# Patient Record
Sex: Female | Born: 1955 | Race: White | Hispanic: No | Marital: Married | Smoking: Former smoker
Health system: Southern US, Community
[De-identification: ages and names within clinical notes are randomized; demographics above are authoritative.]

## PROBLEM LIST (undated history)

## (undated) DIAGNOSIS — K589 Irritable bowel syndrome without diarrhea: Secondary | ICD-10-CM

## (undated) DIAGNOSIS — Z86010 Personal history of colonic polyps: Secondary | ICD-10-CM

## (undated) DIAGNOSIS — I8390 Asymptomatic varicose veins of unspecified lower extremity: Secondary | ICD-10-CM

## (undated) DIAGNOSIS — I10 Essential (primary) hypertension: Secondary | ICD-10-CM

## (undated) DIAGNOSIS — R112 Nausea with vomiting, unspecified: Secondary | ICD-10-CM

## (undated) DIAGNOSIS — Z9889 Other specified postprocedural states: Secondary | ICD-10-CM

## (undated) DIAGNOSIS — K219 Gastro-esophageal reflux disease without esophagitis: Secondary | ICD-10-CM

## (undated) HISTORY — DX: Irritable bowel syndrome, unspecified: K58.9

## (undated) HISTORY — PX: BREAST EXCISIONAL BIOPSY: SUR124

## (undated) HISTORY — DX: Personal history of colonic polyps: Z86.010

## (undated) HISTORY — DX: Asymptomatic varicose veins of unspecified lower extremity: I83.90

## (undated) HISTORY — PX: WISDOM TOOTH EXTRACTION: SHX21

## (undated) HISTORY — PX: COLONOSCOPY: SHX174

## (undated) HISTORY — PX: BREAST BIOPSY: SHX20

## (undated) HISTORY — DX: Gastro-esophageal reflux disease without esophagitis: K21.9

## (undated) HISTORY — PX: UPPER GASTROINTESTINAL ENDOSCOPY: SHX188

## (undated) HISTORY — DX: Essential (primary) hypertension: I10

---

## 1988-05-31 HISTORY — PX: NASAL SEPTUM SURGERY: SHX37

## 1998-08-14 ENCOUNTER — Ambulatory Visit (HOSPITAL_BASED_OUTPATIENT_CLINIC_OR_DEPARTMENT_OTHER): Admission: RE | Admit: 1998-08-14 | Discharge: 1998-08-14 | Payer: Self-pay | Admitting: Otolaryngology

## 2000-01-29 ENCOUNTER — Encounter: Payer: Self-pay | Admitting: Family Medicine

## 2000-01-29 ENCOUNTER — Encounter: Admission: RE | Admit: 2000-01-29 | Discharge: 2000-01-29 | Payer: Self-pay | Admitting: *Deleted

## 2005-01-29 ENCOUNTER — Encounter: Admission: RE | Admit: 2005-01-29 | Discharge: 2005-01-29 | Payer: Self-pay | Admitting: Family Medicine

## 2005-02-09 ENCOUNTER — Encounter: Admission: RE | Admit: 2005-02-09 | Discharge: 2005-02-09 | Payer: Self-pay | Admitting: Family Medicine

## 2005-02-25 ENCOUNTER — Encounter (INDEPENDENT_AMBULATORY_CARE_PROVIDER_SITE_OTHER): Payer: Self-pay | Admitting: *Deleted

## 2005-02-25 ENCOUNTER — Encounter: Admission: RE | Admit: 2005-02-25 | Discharge: 2005-02-25 | Payer: Self-pay | Admitting: Family Medicine

## 2005-08-24 ENCOUNTER — Encounter: Admission: RE | Admit: 2005-08-24 | Discharge: 2005-08-24 | Payer: Self-pay | Admitting: *Deleted

## 2006-02-02 ENCOUNTER — Encounter: Admission: RE | Admit: 2006-02-02 | Discharge: 2006-02-02 | Payer: Self-pay | Admitting: *Deleted

## 2006-08-05 ENCOUNTER — Encounter: Admission: RE | Admit: 2006-08-05 | Discharge: 2006-08-05 | Payer: Self-pay | Admitting: *Deleted

## 2007-08-07 ENCOUNTER — Encounter: Admission: RE | Admit: 2007-08-07 | Discharge: 2007-08-07 | Payer: Self-pay | Admitting: *Deleted

## 2007-12-25 ENCOUNTER — Ambulatory Visit: Payer: Self-pay | Admitting: Internal Medicine

## 2008-01-02 ENCOUNTER — Telehealth: Payer: Self-pay | Admitting: Internal Medicine

## 2008-01-03 ENCOUNTER — Ambulatory Visit: Payer: Self-pay | Admitting: Internal Medicine

## 2008-08-08 ENCOUNTER — Encounter: Admission: RE | Admit: 2008-08-08 | Discharge: 2008-08-08 | Payer: Self-pay | Admitting: *Deleted

## 2009-08-15 ENCOUNTER — Encounter: Admission: RE | Admit: 2009-08-15 | Discharge: 2009-08-15 | Payer: Self-pay | Admitting: *Deleted

## 2010-06-21 ENCOUNTER — Encounter: Payer: Self-pay | Admitting: Family Medicine

## 2010-06-30 NOTE — Progress Notes (Signed)
Summary: PREP MEDS  Phone Note From Pharmacy Call back at [phone]   Caller: [facility]/JODY Call For: Kelsey Simon  Details for Reason: meds Summary of Call: did NOT receive METOCLOPRAMIDINE pt having proc tomorrow. Please re-send ASAP Initial call taken by: [facility],  January 02, 2008 9:15 AM  Follow-up for Phone Call         Have attempted to call [facility] since 10am today and the line is busy. Will continue to try.Barton Fanny RN  January 02, 2008 11:25 AM Spoke with pharmacist and gave phone order for Reglan. Follow-up by: Barton Fanny RN,  January 02, 2008 1:16 PM

## 2010-06-30 NOTE — Procedures (Signed)
Summary: Colonoscopy   Colonoscopy  Procedure date:  01/03/2008  Findings:      Location:  [facility].    Procedures Next Due Date:    Colonoscopy: 12/2017  Patient Name: Kelsey Simon, Kelsey Simon MRN: [id number] Procedure Procedures: Colonoscopy CPT: 09811.  Personnel: Endoscopist: Iva Boop, MD, [facility].  Exam Location: Exam performed in Outpatient Clinic. Outpatient  Patient Consent: Procedure, Alternatives, Risks and Benefits discussed, consent obtained, from patient. Consent was obtained by the RN.  Indications  Average Risk Screening Routine.  History  Current Medications: Patient is not currently taking Coumadin.  Allergies: Allergic to PENICILLIN.  Pre-Exam Physical: Performed Jan 03, 2008. Cardio-pulmonary exam, Rectal exam, HEENT exam , Abdominal exam, Mental status exam WNL.  Comments: Pt. history reviewed/updated, physical exam performed prior to initiation of sedation? YES Exam Exam: Extent of exam reached: Cecum, extent intended: Cecum.  The cecum was identified by appendiceal orifice and IC valve. Patient position: left side to back. Time to Cecum: 00:10:55. Time for Withdrawl: 00: 12:20. Colon retroflexion performed. Images taken. ASA Classification: II. Tolerance: good.  Monitoring: Pulse and BP monitoring, Oximetry used. Supplemental O2 given.  Colon Prep Used MiraLax for colon prep. Prep results: excellent.  Sedation Meds: Patient assessed and found to be appropriate for moderate (conscious) sedation. Fentanyl 100 mcg. given IV. Versed 8 mg. given IV.  Findings - NORMAL EXAM: Cecum to Descending Colon.  - DIVERTICULOSIS: Sigmoid Colon. Comments: mild.  HEMORRHOIDS: Internal. Size: Grade I.   Assessment  Comments: 1) MILD SIGMOID DIVERTICULOSIS 2) SMALL INTERNAL HEMORRHOIDS 3) SOME IBS RESPONSE TO SCOPE INSERTION SUSPECTED 4) OTHERWISE NORMAL SCREENING COLONOSCPY WITH EXCELLENT PREP Events  Unplanned Interventions: No  intervention was required.  Plans Patient Education: Patient given standard instructions for: Diverticulosis. Hemorrhoids.  Disposition: After procedure patient sent to recovery. After recovery patient sent home.  Scheduling/Referral: Colonoscopy, ROUTINE IN 10 YRS,      cc.   Lucy Chris   This report was created from the original endoscopy report, which was reviewed and signed by the above listed endoscopist.

## 2010-07-10 ENCOUNTER — Other Ambulatory Visit: Payer: Self-pay | Admitting: *Deleted

## 2010-07-10 DIAGNOSIS — Z1231 Encounter for screening mammogram for malignant neoplasm of breast: Secondary | ICD-10-CM

## 2010-08-21 ENCOUNTER — Ambulatory Visit
Admission: RE | Admit: 2010-08-21 | Discharge: 2010-08-21 | Disposition: A | Discharge status: Home / Self Care | Payer: BC Managed Care – PPO | Source: Ambulatory Visit | Attending: *Deleted | Admitting: *Deleted

## 2010-08-21 DIAGNOSIS — Z1231 Encounter for screening mammogram for malignant neoplasm of breast: Secondary | ICD-10-CM

## 2011-07-13 ENCOUNTER — Other Ambulatory Visit: Payer: Self-pay | Admitting: *Deleted

## 2011-07-13 DIAGNOSIS — Z1231 Encounter for screening mammogram for malignant neoplasm of breast: Secondary | ICD-10-CM

## 2011-08-24 ENCOUNTER — Ambulatory Visit
Admission: RE | Admit: 2011-08-24 | Discharge: 2011-08-24 | Disposition: A | Discharge status: Home / Self Care | Payer: BC Managed Care – PPO | Source: Ambulatory Visit | Attending: *Deleted | Admitting: *Deleted

## 2011-08-24 DIAGNOSIS — Z1231 Encounter for screening mammogram for malignant neoplasm of breast: Secondary | ICD-10-CM

## 2012-01-04 ENCOUNTER — Ambulatory Visit (INDEPENDENT_AMBULATORY_CARE_PROVIDER_SITE_OTHER): Payer: BC Managed Care – PPO | Admitting: Family Medicine

## 2012-01-04 ENCOUNTER — Encounter: Payer: Self-pay | Admitting: Family Medicine

## 2012-01-04 VITALS — BP 160/100 | HR 80 | Temp 98.3°F | Ht 66.75 in | Wt 146.0 lb

## 2012-01-04 DIAGNOSIS — F419 Anxiety disorder, unspecified: Secondary | ICD-10-CM | POA: Insufficient documentation

## 2012-01-04 DIAGNOSIS — I10 Essential (primary) hypertension: Secondary | ICD-10-CM

## 2012-01-04 DIAGNOSIS — Z136 Encounter for screening for cardiovascular disorders: Secondary | ICD-10-CM

## 2012-01-04 DIAGNOSIS — F4321 Adjustment disorder with depressed mood: Secondary | ICD-10-CM

## 2012-01-04 DIAGNOSIS — Z0000 Encounter for general adult medical examination without abnormal findings: Secondary | ICD-10-CM

## 2012-01-04 LAB — COMPREHENSIVE METABOLIC PANEL
ALT: 28 U/L (ref 0–35)
AST: 26 U/L (ref 0–37)
Albumin: 4.3 g/dL (ref 3.5–5.2)
Alkaline Phosphatase: 38 U/L — ABNORMAL LOW (ref 39–117)
BUN: 14 mg/dL (ref 6–23)
CO2: 28 mEq/L (ref 19–32)
Calcium: 9.4 mg/dL (ref 8.4–10.5)
Chloride: 100 mEq/L (ref 96–112)
Creatinine, Ser: 0.7 mg/dL (ref 0.4–1.2)
GFR: 85.73 mL/min (ref >60.00)
Glucose, Bld: 102 mg/dL — ABNORMAL HIGH (ref 70–99)
Potassium: 3.9 mEq/L (ref 3.5–5.1)
Sodium: 138 mEq/L (ref 135–145)
Total Bilirubin: 0.6 mg/dL (ref 0.3–1.2)
Total Protein: 7.2 g/dL (ref 6.0–8.3)

## 2012-01-04 LAB — LDL CHOLESTEROL, DIRECT: Direct LDL: 123.1 mg/dL

## 2012-01-04 LAB — LIPID PANEL
Cholesterol: 228 mg/dL — ABNORMAL HIGH (ref 0–200)
HDL: 97.8 mg/dL (ref >39.00)
Total CHOL/HDL Ratio: 2
Triglycerides: 56 mg/dL (ref 0.0–149.0)
VLDL: 11.2 mg/dL (ref 0.0–40.0)

## 2012-01-04 NOTE — Patient Instructions (Addendum)
It was so nice to meet you. Please call Dr. Lorenda Cahill- I will let you know you may be calling. [address]  [place], [place] [address] [phone] Office [phone] Fax  Make an appointment to see Dr. Patsy Lager on your way out. We will call you with your lab results.

## 2012-01-04 NOTE — Progress Notes (Signed)
Subjective:    Patient ID: Kelsey Simon, female    DOB: 1955/09/20, 56 y.o.   MRN: [id number]  HPI  56 yo here to establish care.   HTN- very elevated today but had an emergency at work.  BP at home typically runs in 130s-140s/80s.  Denies any HA, blurred vision, CP or SOB. Has been on current dose of Maxzide for years.  Grief- father just died in 01-17-2023.  She feels she is coping ok with that but now concerned about caring for her mother.  Her daughter is also a recovering drug addict.  Feels anxious at times but does not feel that she has an anxiety disorder or depression. No SI or HI. She is sleeping ok.  Appetite ok.  Does not have time to exercise like she likes to. Now works in [place] as well.  Patient Active Problem List  Diagnosis  . Hypertension  . Grief   Past Medical History  Diagnosis Date  . Hypertension   . Varicose veins    Past Surgical History  Procedure Date  . Nasal septum surgery 1990   History  Substance Use Topics  . Smoking status: Former Games developer  . Smokeless tobacco: Not on file  . Alcohol Use: Not on file   Family History  Problem Relation Age of Onset  . Hypertension Mother   . Cancer Father   . Drug abuse Daughter    Allergies  Allergen Reactions  . Penicillins     REACTION: rash   Current Outpatient Prescriptions on File Prior to Visit  Medication Sig Dispense Refill  . Calcium Carbonate-Vit D-Min (CALCIUM 600 + MINERALS PO) Take by mouth.      . clonazePAM (KLONOPIN) 0.5 MG tablet Take one half by mouth as needed for sleep      . triamterene-hydrochlorothiazide (MAXZIDE) 75-50 MG per tablet Take 1 tablet by mouth daily.       The PMH, PSH, Social History, Family History, Medications, and allergies have been reviewed in [facility], and have been updated if relevant.   Review of Systems See HPI Patient reports no  vision/ hearing changes,anorexia, fever ,adenopathy, persistant / recurrent hoarseness, swallowing issues, chest pain,  edema,persistant / recurrent cough, hemoptysis, dyspnea(rest, exertional, paroxysmal nocturnal), gastrointestinal  bleeding (melena, rectal bleeding), abdominal pain, excessive heart burn, GU symptoms(dysuria, hematuria, pyuria, voiding/incontinence  Issues) syncope, focal weakness, severe memory loss, concerning skin lesions,  abnormal bruising/bleeding, major joint swelling, breast masses or abnormal vaginal bleeding.       Objective:   Physical Exam BP 160/100  Pulse 80  Temp 98.3 F (36.8 C)  Ht 5' 6.75" (1.695 m)  Wt 146 lb (66.225 kg)  BMI 23.04 kg/m2  General:  Well-developed,well-nourished,in no acute distress; alert,appropriate and cooperative throughout examination Head:  normocephalic and atraumatic.   Eyes:  vision grossly intact, pupils equal, pupils round, and pupils reactive to light.   Ears:  R ear normal and L ear normal.   Nose:  no external deformity.   Mouth:  good dentition.   Lungs:  Normal respiratory effort, chest expands symmetrically. Lungs are clear to auscultation, no crackles or wheezes. Heart:  Normal rate and regular rhythm. S1 and S2 normal without gallop, murmur, click, rub or other extra sounds. Abdomen:  Bowel sounds positive,abdomen soft and non-tender without masses, organomegaly or hernias noted. Msk:  No deformity or scoliosis noted of thoracic or lumbar spine.   Extremities:  No clubbing, cyanosis, edema, or deformity noted with normal full range  of motion of all joints.   Neurologic:  alert & oriented X3 and gait normal.   Skin:  Intact without suspicious lesions or rashes Psych:  Cognition and judgment appear intact. Alert and cooperative with normal attention span and concentration. No apparent delusions, illusions, hallucinations        Assessment & Plan:   1. Grief  >30 min spent with face to face with patient, >50% counseling and/or coordinating care. Will refer to Dr. Lorenda Cahill for psychotherapy- she has had significant stressors  and trauma in her life recently. Agreed with pt that rx is not indicated at this time. The patient indicates understanding of these issues and agrees with the plan.      2. Hypertension  Deteriorated but with stable readings at home. Check CMET today- she is not taking potassium. The patient indicates understanding of these issues and agrees with the plan.

## 2012-01-17 ENCOUNTER — Encounter: Payer: Self-pay | Admitting: Family Medicine

## 2012-01-17 ENCOUNTER — Ambulatory Visit: Payer: BC Managed Care – PPO | Admitting: Family Medicine

## 2012-01-17 ENCOUNTER — Ambulatory Visit (INDEPENDENT_AMBULATORY_CARE_PROVIDER_SITE_OTHER): Payer: BC Managed Care – PPO | Admitting: Family Medicine

## 2012-01-17 ENCOUNTER — Ambulatory Visit (INDEPENDENT_AMBULATORY_CARE_PROVIDER_SITE_OTHER)
Admission: RE | Admit: 2012-01-17 | Discharge: 2012-01-17 | Disposition: A | Discharge status: Home / Self Care | Payer: BC Managed Care – PPO | Source: Ambulatory Visit | Attending: Family Medicine | Admitting: Family Medicine

## 2012-01-17 VITALS — BP 140/78 | HR 72 | Temp 98.9°F | Ht 66.75 in | Wt 148.5 lb

## 2012-01-17 DIAGNOSIS — M25561 Pain in right knee: Secondary | ICD-10-CM

## 2012-01-17 DIAGNOSIS — M25569 Pain in unspecified knee: Secondary | ICD-10-CM

## 2012-01-17 DIAGNOSIS — M222X1 Patellofemoral disorders, right knee: Secondary | ICD-10-CM

## 2012-01-17 NOTE — Progress Notes (Signed)
Nature conservation officer at [facility] [address] [place] [address] Phone: [phone] Fax: [phone]  Date:  01/17/2012   Name:  Kelsey Simon   DOB:  1955/06/05   MRN:  [id number] Gender: female  Age: 56 y.o.  PCP:  Ruthe Mannan, MD    Chief Complaint: Knee Pain   History of Present Illness:  Kelsey Simon is a 56 y.o. very pleasant female patient who presents with the following:  R knee:  Landed on knee one year ago and in between that time has been doing some zume sumba and pilates. 1 year ago. Has not hurt for the past few days. Has had to lay off for the last few months. She has been doing a lot less in the gym, and she is has some quadriceps wasting. She has some grinding and a feeling of abnormality when she is flexing the knee. She is able that the plaques in the. She has some difficulty with rotation at the knee. No mechanical locking up. No symptomatic giving way. No effusions. She has not done any rehabilitation. Over-the-counter anti-inflammatories and Tylenol have not really helped.  Past Medical History, Surgical History, Social History, Family History, Problem List, Medications, and Allergies have been reviewed and updated if relevant.  Current Outpatient Prescriptions on File Prior to Visit  Medication Sig Dispense Refill  . Ascorbic Acid (VITAMIN C) 1000 MG tablet Take 1,000 mg by mouth daily.      Marland Kitchen b complex vitamins tablet Take 1 tablet by mouth daily.      . Biotin 1000 MCG tablet Take 1,000 mcg by mouth daily.      . Calcium Carbonate-Vit D-Min (CALCIUM 600 + MINERALS PO) Take by mouth.      . Cholecalciferol (D3-1000 PO) Take one by mouth daily      . clonazePAM (KLONOPIN) 0.5 MG tablet Take one half by mouth as needed for sleep      . magnesium gluconate (MAGONATE) 500 MG tablet Take 500 mg by mouth daily.      . Milk Thistle 250 MG CAPS Take one by mouth daily      . pyridOXINE (VITAMIN B-6) 100 MG tablet Take 100 mg by mouth. Take by mouth  3 times a week      . triamterene-hydrochlorothiazide (MAXZIDE) 75-50 MG per tablet Take 1 tablet by mouth daily.        Review of Systems:  GEN: No fevers, chills. Nontoxic. Primarily MSK c/o today. MSK: Detailed in the HPI GI: tolerating PO intake without difficulty Neuro: No numbness, parasthesias, or tingling associated. Otherwise the pertinent positives of the ROS are noted above.    Physical Examination: Filed Vitals:   01/17/12 1514  BP: 140/78  Pulse: 72  Temp: 98.9 F (37.2 C)   Filed Vitals:   01/17/12 1514  Height: 5' 6.75" (1.695 m)  Weight: 148 lb 8 oz (67.359 kg)   Body mass index is 23.43 kg/(m^2). Ideal Body Weight: Weight in (lb) to have BMI = 25: 158.1    GEN: WDWN, NAD, Non-toxic, Alert & Oriented x 3 HEENT: Atraumatic, Normocephalic.  Ears and Nose: No external deformity. EXTR: No clubbing/cyanosis/edema NEURO: Normal gait.  PSYCH: Normally interactive. Conversant. Not depressed or anxious appearing.  Calm demeanor.   Knee: R Gait: Normal heel toe pattern ROM: WNL Effusion: neg Echymosis or edema: none Patellar tendon NT Painful PLICA: neg Patellar grind: minimal, but tracking abnormalities felt with movement Medial and lateral patellar facet  loading: NT medial and lateral joint lines:NT Mcmurray's neg Flexion-pinch neg Varus and valgus stress: stable Lachman: neg Ant and Post drawer: neg Hip abduction, IR, ER: WNL Hip flexion str: 5/5 Hip abd: 5/5 Quad: 5/5 VMO atrophy: MILD-mod Hamstring concentric and eccentric: 5/5  Dg Knee 1-2 Views Right  01/17/2012  *RADIOLOGY REPORT*  Clinical Data: Right knee pain  RIGHT KNEE - 1-2 VIEW  Comparison: None.  Findings: Two views of the right knee submitted.  No acute fracture or subluxation.  No joint effusion.  IMPRESSION: No acute fracture or subluxation.   Original Report Authenticated By: Natasha Mead, M.D.    Dg Knee Bilateral Standing Ap  01/17/2012  *RADIOLOGY REPORT*  Clinical Data:  right  knee pain  BILATERAL KNEES STANDING - 1 VIEW  Comparison: None.  Findings: Single frontal view bilateral knee submitted.  No acute fracture or subluxation.  Joint spaces are preserved.  IMPRESSION: No acute fracture or subluxation.   Original Report Authenticated By: Natasha Mead, M.D.     Assessment and Plan:  1. Right knee pain  DG Knee Bilateral Standing AP, DG Knee 1-2 Views Right  2. Patellofemoral syndrome, right     Patellofemoral knee pain after trauma and now with subsequent quadriceps wasting. Reviewed patellofemoral rehabilitation.  Also recommended a body Helix patellar strap for exercise. Reassured the patient, and since as needed for pain.  Orders Today:  Orders Placed This Encounter  Procedures  . DG Knee Bilateral Standing AP    Standing Status: Future     Number of Occurrences: 1     Standing Expiration Date: 03/18/2013    Order Specific Question:  Preferred imaging location?    Answer:  [facility]    Order Specific Question:  Reason for exam:    Answer:  r knee pain  . DG Knee 1-2 Views Right    Standing Status: Future     Number of Occurrences: 1     Standing Expiration Date: 03/18/2013    Order Specific Question:  Preferred imaging location?    Answer:  [facility]    Order Specific Question:  Reason for exam:    Answer:  right knee pain    Medications Today: (Includes new updates added during medication reconciliation) No orders of the defined types were placed in this encounter.    Medications Discontinued: There are no discontinued medications.   Hannah Beat, MD

## 2012-01-17 NOTE — Patient Instructions (Addendum)
BODYHELIX  Www.bodyhelix.com  Use website instuctions for measurement of limb to determine size.   Look for "Patellar Helix" - this should be placed underneath the kneecap on the affected side and above the bony part at the upper end of your tibia. - It should fit in the soft spot where your patellar tendon is located.  Over the years, I have found that athletes and active people like this product the most with patellar tendonitis. It costs about 40 dollars.  (I have no financial interest in this company and gain nothing from recommending their products)   Patellofemoral Syndrome Rehab  Isometric contractions of thigh - 10 x 10 secs  3 way straight leg raises - build to 3 sets of 30 and then add weights begin with no weight. When 3 x 30 reached, Add 2 lb. ankle weight. Increase to 3,4,5,6 when 3x30 achieved.  Drop squats - limit to 45 deg, 3x15  Modified lunge - running position, 3x15  Seated quad extensions, 3x15, add ankle weights  Step downs, 3x15 with body weight slowly on downward phase  Knee up and open hip: knee up and externally rotate hip to open position, hold 2 sec and repeat each leg, 30 reps  Cone Drills: Right Leg, Right Hand Right Leg, Left Hand Left Leg, Right Hand Left Leg, Left Hand Start with 1 cone, progress to 3 20 each exercise  Lateral Leg Reach Balance knee, reach out laterally to cone and touch. Hold at Knee up position for 2 seconds 20 reps each leg  Rear Leg Reach Directly behind - place cone 20 reps

## 2012-01-19 ENCOUNTER — Ambulatory Visit: Payer: BC Managed Care – PPO | Admitting: Family Medicine

## 2012-02-08 ENCOUNTER — Encounter: Payer: Self-pay | Admitting: Family Medicine

## 2012-02-28 ENCOUNTER — Encounter: Payer: Self-pay | Admitting: Family Medicine

## 2012-02-28 ENCOUNTER — Ambulatory Visit (INDEPENDENT_AMBULATORY_CARE_PROVIDER_SITE_OTHER): Payer: BC Managed Care – PPO | Admitting: Family Medicine

## 2012-02-28 VITALS — BP 160/90 | HR 84 | Temp 98.1°F | Wt 150.0 lb

## 2012-02-28 DIAGNOSIS — N390 Urinary tract infection, site not specified: Secondary | ICD-10-CM

## 2012-02-28 LAB — POCT URINALYSIS DIPSTICK
Glucose, UA: NEGATIVE
Spec Grav, UA: <=1.005
pH, UA: 7.5

## 2012-02-28 MED ORDER — CIPROFLOXACIN HCL 500 MG PO TABS: 500.0000 mg | ORAL_TABLET | Freq: Two times a day (BID) | ORAL | Status: DC

## 2012-02-28 NOTE — Progress Notes (Signed)
SUBJECTIVE: Kelsey Simon is a 56 y.o. female who complains of urinary frequency, urgency and dysuria x 2 days.  She does have bilateral flank pain, nausea.  No fever, chills, or abnormal vaginal discharge or bleeding.   Patient Active Problem List  Diagnosis  . Hypertension  . Grief   Past Medical History  Diagnosis Date  . Hypertension   . Varicose veins    Past Surgical History  Procedure Date  . Nasal septum surgery 1990   History  Substance Use Topics  . Smoking status: Former Games developer  . Smokeless tobacco: Never Used  . Alcohol Use: Yes     wine daily   Family History  Problem Relation Age of Onset  . Hypertension Mother   . Cancer Father   . Drug abuse Daughter    Allergies  Allergen Reactions  . Penicillins     REACTION: rash   Current Outpatient Prescriptions on File Prior to Visit  Medication Sig Dispense Refill  . Ascorbic Acid (VITAMIN C) 1000 MG tablet Take 1,000 mg by mouth daily.      Marland Kitchen b complex vitamins tablet Take 1 tablet by mouth daily.      . Biotin 1000 MCG tablet Take 1,000 mcg by mouth daily.      . Calcium Carbonate-Vit D-Min (CALCIUM 600 + MINERALS PO) Take by mouth.      . Cholecalciferol (D3-1000 PO) Take one by mouth daily      . clonazePAM (KLONOPIN) 0.5 MG tablet Take one half by mouth as needed for sleep      . magnesium gluconate (MAGONATE) 500 MG tablet Take 500 mg by mouth daily.      . Milk Thistle 250 MG CAPS Take one by mouth daily      . pyridOXINE (VITAMIN B-6) 100 MG tablet Take 100 mg by mouth. Take by mouth 3 times a week      . triamterene-hydrochlorothiazide (MAXZIDE) 75-50 MG per tablet Take 1 tablet by mouth daily.       The PMH, PSH, Social History, Family History, Medications, and allergies have been reviewed in [facility], and have been updated if relevant.  OBJECTIVE:  BP 160/90  Pulse 84  Temp 98.1 F (36.7 C)  Wt 150 lb (68.04 kg)  Appears well, in no apparent distress.  Vital signs are normal. The abdomen is soft  without tenderness, guarding, mass, rebound or organomegaly. No CVA tenderness or inguinal adenopathy noted. Urine dipstick shows unable to read due to AZO use  ASSESSMENT: UTI complicated by back pain and abx allergies  PLAN: Treatment per orders - cipro 500 mg twice daily x 7 days, send urine for cx, also push fluids, may use Pyridium OTC prn. Call or return to clinic prn if these symptoms worsen or fail to improve as anticipated.

## 2012-02-28 NOTE — Patient Instructions (Addendum)
Great to see you. We are treating you for a UTI- cipro 1 tablet twice daily for 7 days. Continue drinking lots of water and your AZO as needed.

## 2012-02-28 NOTE — Addendum Note (Signed)
Addended by: Eliezer Bottom on: 02/28/2012 09:35 AM   Modules accepted: Orders

## 2012-03-01 LAB — URINE CULTURE: Colony Count: >=100000

## 2012-03-06 ENCOUNTER — Telehealth (INDEPENDENT_AMBULATORY_CARE_PROVIDER_SITE_OTHER): Payer: BC Managed Care – PPO | Admitting: Family Medicine

## 2012-03-06 DIAGNOSIS — R300 Dysuria: Secondary | ICD-10-CM

## 2012-03-06 MED ORDER — CIPROFLOXACIN HCL 500 MG PO TABS: 500.0000 mg | ORAL_TABLET | Freq: Two times a day (BID) | ORAL | Status: AC

## 2012-03-06 NOTE — Telephone Encounter (Signed)
No she does not need an appointment. Please extend course of cipro- I will send in 3 day course. Please leave a urine sample for [place].

## 2012-03-06 NOTE — Telephone Encounter (Signed)
Caller: [place]/Patient; Patient Name: Kelsey Simon; PCP: Ruthe Mannan [facility]); Best Callback Phone Number: [phone]; Call regarding follow up from UTI, Pt still has Frequency and Lower Back pain after finishing Cipro on 10-6.  Afebrile.  All emergent sypmtoms ruled out per Urinary Symptoms, see in 24 hours, due to evaluated by provider and symptoms worsening after treatment.   Patient was in office on 9-30.  Patient would like to know if she can bring Urine sample by office if MD feels it's necessary or will MD write another script to continue her antibiotics.  Urine culture performed on 9-30, positive E Coli.  Please follow up with Patient if MD feels Patient needs appointment. Pt uses [facility], [phone].

## 2012-03-06 NOTE — Telephone Encounter (Signed)
Spoke with patient, she will give urine sample either today or tomorrow am, for dip and culture.

## 2012-03-07 LAB — URINALYSIS, ROUTINE W REFLEX MICROSCOPIC
Bilirubin Urine: NEGATIVE
Hgb urine dipstick: NEGATIVE
Ketones, ur: NEGATIVE
Leukocytes, UA: NEGATIVE
Nitrite: NEGATIVE
Specific Gravity, Urine: 1.01 (ref 1.000–1.030)
Total Protein, Urine: NEGATIVE
Urine Glucose: NEGATIVE
Urobilinogen, UA: 0.2 (ref 0.0–1.0)
pH: 7.5 (ref 5.0–8.0)

## 2012-03-07 NOTE — Telephone Encounter (Signed)
Pt left urine sample, sent for micro and culture.

## 2012-03-09 LAB — URINE CULTURE
Colony Count: NO GROWTH
Organism ID, Bacteria: NO GROWTH

## 2012-03-10 ENCOUNTER — Ambulatory Visit: Payer: BC Managed Care – PPO | Admitting: Family Medicine

## 2012-03-15 ENCOUNTER — Ambulatory Visit (INDEPENDENT_AMBULATORY_CARE_PROVIDER_SITE_OTHER): Payer: BC Managed Care – PPO | Admitting: Family Medicine

## 2012-03-15 ENCOUNTER — Encounter: Payer: Self-pay | Admitting: Family Medicine

## 2012-03-15 VITALS — BP 122/90 | HR 84 | Temp 98.0°F | Wt 149.0 lb

## 2012-03-15 DIAGNOSIS — IMO0002 Reserved for concepts with insufficient information to code with codable children: Secondary | ICD-10-CM

## 2012-03-15 DIAGNOSIS — I8390 Asymptomatic varicose veins of unspecified lower extremity: Secondary | ICD-10-CM | POA: Insufficient documentation

## 2012-03-15 DIAGNOSIS — I10 Essential (primary) hypertension: Secondary | ICD-10-CM

## 2012-03-15 DIAGNOSIS — M5410 Radiculopathy, site unspecified: Secondary | ICD-10-CM

## 2012-03-15 MED ORDER — DEXAMETHASONE SOD PHOSPHATE PF 10 MG/ML IJ SOLN
10.0000 mg | Freq: Once | INTRAMUSCULAR | Status: AC
  Administered 2012-03-15: 10 mg via INTRAMUSCULAR

## 2012-03-15 NOTE — Progress Notes (Signed)
Subjective:    Patient ID: Kelsey Simon, female    DOB: [birth year], 56 y.o.   MRN: [id number]  HPI  56 yo here to discuss multiple issues-  HTN- Denies any HA, blurred vision, CP or SOB. Has been on current dose of Maxzide for years. Was elevated at last office visit but now normotensive.  Seeing Dr. Sherrine Maples now which is helping her with her anxiety. BP Readings from Last 3 Encounters:  03/15/12 122/90  02/28/12 160/90  01/17/12 140/78    Recent UTI- Saw her on 9/30- treated her for UTI based on symptoms but urine cx neg. Symptoms have improved.  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  Per pt, saw a neurosurgeon who felt she was not a surgical candidate at that time. She has noticed more radiculopathy in her right arm now, can go all the way to her fingers.  No UE weakness.  No LE radiculopathy.  Very rarely does she have left sided radiculopathy.  Varicose veins-  One on left lower leg starting to really bother her, becoming painful. Failed compression hose. Saw vein specialist who recommended intervention.    Patient Active Problem List  Diagnosis  . Hypertension  . Grief   Past Medical History  Diagnosis Date  . Hypertension   . Varicose veins    Past Surgical History  Procedure Date  . Nasal septum surgery 1990   History  Substance Use Topics  . Smoking status: Former Games developer  . Smokeless tobacco: Never Used  . Alcohol Use: Yes     wine daily   Family History  Problem Relation Age of Onset  . Hypertension Mother   . Cancer Father   . Drug abuse Daughter    Allergies  Allergen Reactions  . Penicillins     REACTION: rash   Current Outpatient Prescriptions on File Prior to Visit  Medication Sig Dispense Refill  . Ascorbic Acid (VITAMIN C) 1000 MG tablet Take 1,000 mg by mouth daily.      Marland Kitchen b complex vitamins tablet Take 1 tablet by mouth daily.      . Biotin 1000 MCG tablet Take  1,000 mcg by mouth daily.      . Calcium Carbonate-Vit D-Min (CALCIUM 600 + MINERALS PO) Take by mouth.      . Cholecalciferol (D3-1000 PO) Take one by mouth daily      . clonazePAM (KLONOPIN) 0.5 MG tablet Take one half by mouth as needed for sleep      . magnesium gluconate (MAGONATE) 500 MG tablet Take 500 mg by mouth daily.      . Milk Thistle 250 MG CAPS Take one by mouth daily      . pyridOXINE (VITAMIN B-6) 100 MG tablet Take 100 mg by mouth. Take by mouth 3 times a week      . triamterene-hydrochlorothiazide (MAXZIDE) 75-50 MG per tablet Take 1 tablet by mouth daily.       The PMH, PSH, Social History, Family History, Medications, and allergies have been reviewed in [facility], and have been updated if relevant.   Review of Systems See HPI Patient reports no  vision/ hearing changes,anorexia, fever ,adenopathy, persistant / recurrent hoarseness, swallowing issues, chest pain, edema,persistant / recurrent cough, hemoptysis, dyspnea(rest, exertional, paroxysmal nocturnal), gastrointestinal  bleeding (melena, rectal bleeding), abdominal pain, excessive heart burn, GU symptoms(dysuria, hematuria, pyuria, voiding/incontinence  Issues) syncope, focal weakness, severe memory loss, concerning skin lesions,  abnormal bruising/bleeding, major  joint swelling, breast masses or abnormal vaginal bleeding.       Objective:   Physical Exam BP 122/90  Pulse 84  Temp 98 F (36.7 C)  Wt 149 lb (67.586 kg)  General:  Well-developed,well-nourished,in no acute distress; alert,appropriate and cooperative throughout examination Head:  normocephalic and atraumatic.   Eyes:  vision grossly intact, pupils equal, pupils round, and pupils reactive to light.   Ears:  R ear normal and L ear normal.   Nose:  no external deformity.   Mouth:  good dentition.   Lungs:  Normal respiratory effort, chest expands symmetrically. Lungs are clear to auscultation, no crackles or wheezes. Heart:  Normal rate and regular  rhythm. S1 and S2 normal without gallop, murmur, click, rub or other extra sounds. Abdomen:  Bowel sounds positive,abdomen soft and non-tender without masses, organomegaly or hernias noted. Msk:  No deformity or scoliosis noted of thoracic or lumbar spine.   Extremities:  No clubbing, cyanosis, edema, or deformity noted with normal full range of motion of all joints.  Large varicosity, LLL  Neurologic:  alert & oriented X3 and gait normal.   DTRs normal and symmetrical Skin:  Intact without suspicious lesions or rashes Psych:  Cognition and judgment appear intact. Alert and cooperative with normal attention span and concentration. No apparent delusions, illusions, hallucinations        Assessment & Plan:     1. Hypertension  Stable on current rx   2. Radiculopathy  Deteriorated- neuro exam reassuring- reflexes normal and symptoms mainly in right arm,  which does make cervical myelopathy less likely.   Given IM decadron today to see if it will help with symptoms.  I did recommend follow up with neurosurgery to make sure that her stenosis has not progressed. The patient indicates understanding of these issues and agrees with the plan.   dexamethasone Sod Phosphate PF SOLN 10 mg  3. Varicose vein of leg  Deteriorated.   Pt asks for note to be sent to vein specialist documenting her varicosity. Will send note.

## 2012-03-15 NOTE — Patient Instructions (Addendum)
Good to see you. Please call me next with an update of your symptoms.

## 2012-03-21 ENCOUNTER — Other Ambulatory Visit: Payer: Self-pay | Admitting: *Deleted

## 2012-03-21 MED ORDER — TRIAMTERENE-HCTZ 75-50 MG PO TABS: 1.0000 | ORAL_TABLET | Freq: Every day | ORAL | Status: DC

## 2012-08-01 ENCOUNTER — Other Ambulatory Visit: Payer: Self-pay

## 2012-08-01 DIAGNOSIS — Z1231 Encounter for screening mammogram for malignant neoplasm of breast: Secondary | ICD-10-CM

## 2012-08-25 ENCOUNTER — Encounter: Payer: Self-pay | Admitting: Family Medicine

## 2012-08-25 ENCOUNTER — Telehealth: Payer: Self-pay | Admitting: Family Medicine

## 2012-08-25 ENCOUNTER — Ambulatory Visit: Payer: BC Managed Care – PPO

## 2012-08-25 ENCOUNTER — Ambulatory Visit (INDEPENDENT_AMBULATORY_CARE_PROVIDER_SITE_OTHER): Payer: BC Managed Care – PPO | Admitting: Family Medicine

## 2012-08-25 VITALS — BP 128/88 | HR 80 | Temp 98.7°F | Wt 152.0 lb

## 2012-08-25 DIAGNOSIS — R300 Dysuria: Secondary | ICD-10-CM

## 2012-08-25 DIAGNOSIS — N390 Urinary tract infection, site not specified: Secondary | ICD-10-CM

## 2012-08-25 DIAGNOSIS — N3281 Overactive bladder: Secondary | ICD-10-CM | POA: Insufficient documentation

## 2012-08-25 LAB — POCT URINALYSIS DIPSTICK
Bilirubin, UA: NEGATIVE
Glucose, UA: NEGATIVE
Ketones, UA: NEGATIVE
Nitrite, UA: NEGATIVE
Protein, UA: NEGATIVE
Spec Grav, UA: 1.02
Urobilinogen, UA: 0.2
pH, UA: 6

## 2012-08-25 MED ORDER — CIPROFLOXACIN HCL 500 MG PO TABS: 500.0000 mg | ORAL_TABLET | Freq: Two times a day (BID) | ORAL | Status: DC

## 2012-08-25 NOTE — Patient Instructions (Signed)
You do have UTI - treat with 5d course of cipro. Push fluids and rest. Tylenol and azo for discomfort. Update [place] if not improving as expected.  Urinary Tract Infection Urinary tract infections (UTIs) can develop anywhere along your urinary tract. Your urinary tract is your body's drainage system for removing wastes and extra water. Your urinary tract includes two kidneys, two ureters, a bladder, and a urethra. Your kidneys are a pair of bean-shaped organs. Each kidney is about the size of your fist. They are located below your ribs, one on each side of your spine. CAUSES Infections are caused by microbes, which are microscopic organisms, including fungi, viruses, and bacteria. These organisms are so small that they can only be seen through a microscope. Bacteria are the microbes that most commonly cause UTIs. SYMPTOMS  Symptoms of UTIs may vary by age and gender of the patient and by the location of the infection. Symptoms in young women typically include a frequent and intense urge to urinate and a painful, burning feeling in the bladder or urethra during urination. Older women and men are more likely to be tired, shaky, and weak and have muscle aches and abdominal pain. A fever may mean the infection is in your kidneys. Other symptoms of a kidney infection include pain in your back or sides below the ribs, nausea, and vomiting. DIAGNOSIS To diagnose a UTI, your caregiver will ask you about your symptoms. Your caregiver also will ask to provide a urine sample. The urine sample will be tested for bacteria and white blood cells. White blood cells are made by your body to help fight infection. TREATMENT  Typically, UTIs can be treated with medication. Because most UTIs are caused by a bacterial infection, they usually can be treated with the use of antibiotics. The choice of antibiotic and length of treatment depend on your symptoms and the type of bacteria causing your infection. HOME CARE  INSTRUCTIONS  If you were prescribed antibiotics, take them exactly as your caregiver instructs you. Finish the medication even if you feel better after you have only taken some of the medication.  Drink enough water and fluids to keep your urine clear or pale yellow.  Avoid caffeine, tea, and carbonated beverages. They tend to irritate your bladder.  Empty your bladder often. Avoid holding urine for long periods of time.  Empty your bladder before and after sexual intercourse.  After a bowel movement, women should cleanse from front to back. Use each tissue only once. SEEK MEDICAL CARE IF:   You have back pain.  You develop a fever.  Your symptoms do not begin to resolve within 3 days. SEEK IMMEDIATE MEDICAL CARE IF:   You have severe back pain or lower abdominal pain.  You develop chills.  You have nausea or vomiting.  You have continued burning or discomfort with urination. MAKE SURE YOU:   Understand these instructions.  Will watch your condition.  Will get help right away if you are not doing well or get worse. Document Released: 02/24/2005 Document Revised: 11/16/2011 Document Reviewed: 06/25/2011 [facility] Patient Information 2013 [place], [place].

## 2012-08-25 NOTE — Telephone Encounter (Signed)
Will see today.  

## 2012-08-25 NOTE — Assessment & Plan Note (Signed)
Uncomplicated cystitis. UA/micro consistent with infection.  UCx not sent. Finish 5 d course of cipro 500mg  bid. Discussed supportive care as per instructions.

## 2012-08-25 NOTE — Progress Notes (Signed)
  Subjective:    Patient ID: Kelsey Simon, female    DOB: [birth year], 57 y.o.   MRN: [id number]  HPI CC: ?UTI  Current sxs started 3 days ago - worse this morning.  Urgency, dysuria, frequency, some lower back pain, lower abd pressure.  No hematuria.  No fevers/chills, nausea/vomiting.  Taking azo as well as 1 dose of left over cipro 500mg  that she took in October.  Prior UTI did not improve after 3d course.  Good water intake.  Past Medical History  Diagnosis Date  . Hypertension   . Varicose veins      Review of Systems Per HPI    Objective:   Physical Exam  Nursing note and vitals reviewed. Constitutional: She appears well-developed and well-nourished. No distress.  Abdominal: Soft. Normal appearance and bowel sounds are normal. She exhibits no distension and no mass. There is no hepatosplenomegaly. There is no tenderness. There is no rebound, no guarding and no CVA tenderness. No hernia.       Assessment & Plan:

## 2012-08-25 NOTE — Telephone Encounter (Signed)
Patient Information:  Caller Name: Samoria  Phone: [phone]  Patient: Kelsey Simon, Kelsey Simon  Gender: Female  DOB: 09/30/1955  Age: 57 Years  PCP: Ruthe Mannan [facility])  Office Follow Up:  Does the office need to follow up with this patient?: No  Instructions For The Office: N/A  RN Note:  Patient needs later appt due to work. Scheduled at 2:30/ Dr. Clifton Custard not in the office.  Advised to call back for fever, pain or worsening  Symptoms  Reason For Call & Symptoms: Patient believes she has another UTI. Onset of s/sx yesterday 08/24/12.  Last UTI Novemeber 2014. She has treated with AZo and 1 left over cipro. +frequency and + Urgency, + burning, no blood noted, no odor.   Last UOP- 08:45  Reviewed Health History In EMR: Yes  Reviewed Medications In EMR: Yes  Reviewed Allergies In EMR: Yes  Reviewed Surgeries / Procedures: No  Date of Onset of Symptoms: 08/24/2012  Treatments Tried: Azo and 1 cipro left over  Treatments Tried Worked: No  Guideline(s) Used:  Urination Pain - Female  Disposition Per Guideline:   Go to Office Now  Reason For Disposition Reached:   Side (flank) or lower back pain present  Advice Given:  Fluids:   Drink extra fluids. Drink 8-10 glasses of liquids a day (Reason: to produce a dilute, non-irritating urine).  Cranberry Juice:   Some people think that drinking cranberry juice may help in fighting urinary tract infections. However, there is no good research that has ever proved this.  Call Back If:  You become worse.  Patient Will Follow Care Advice:  YES  Appointment Scheduled:  08/25/2012 14:30:00 Appointment Scheduled Provider:  Eustaquio Boyden [facility])

## 2012-09-28 ENCOUNTER — Ambulatory Visit
Admission: RE | Admit: 2012-09-28 | Discharge: 2012-09-28 | Disposition: A | Discharge status: Home / Self Care | Payer: BC Managed Care – PPO | Source: Ambulatory Visit

## 2012-09-28 DIAGNOSIS — Z1231 Encounter for screening mammogram for malignant neoplasm of breast: Secondary | ICD-10-CM

## 2012-09-29 ENCOUNTER — Other Ambulatory Visit: Payer: Self-pay | Admitting: *Deleted

## 2012-09-29 MED ORDER — DICYCLOMINE HCL 20 MG PO TABS: 20.0000 mg | ORAL_TABLET | Freq: Four times a day (QID) | ORAL | Status: DC | PRN

## 2012-09-29 NOTE — Telephone Encounter (Signed)
Last filled 02/10/12

## 2012-11-13 ENCOUNTER — Ambulatory Visit (INDEPENDENT_AMBULATORY_CARE_PROVIDER_SITE_OTHER): Payer: BC Managed Care – PPO | Admitting: Family Medicine

## 2012-11-13 ENCOUNTER — Encounter: Payer: Self-pay | Admitting: Family Medicine

## 2012-11-13 VITALS — BP 142/80 | HR 90 | Temp 98.4°F | Wt 149.2 lb

## 2012-11-13 DIAGNOSIS — R300 Dysuria: Secondary | ICD-10-CM

## 2012-11-13 LAB — POCT URINALYSIS DIPSTICK
Bilirubin, UA: NEGATIVE
Blood, UA: NEGATIVE
Glucose, UA: NEGATIVE
Ketones, UA: NEGATIVE
Leukocytes, UA: NEGATIVE
Nitrite, UA: NEGATIVE
Protein, UA: NEGATIVE
Spec Grav, UA: 1.01
Urobilinogen, UA: NEGATIVE
pH, UA: 6.5

## 2012-11-13 NOTE — Patient Instructions (Addendum)
We'll contact you with your lab report and we'll go from there.  Don't take the antibiotics for now.

## 2012-11-13 NOTE — Progress Notes (Signed)
1st UTI in her 51s. Next UTI was last year; she's had mult episodes in the last year.  Now with a few weeks of change in urination.  Not burning, but had frequency, urgency.  Some suprapubic pain yesterday, better now.  No FCNAV.    Tick bite about 9 days ago on her neck.  She had some local swelling.  Also with several other tick bites in the last few months.  Ticks were attached but not engorged.  No rash.  No fevers.   The urinary sx predate all of the tick bites.  Not on abx recently.  She has been drinking more cranberry juice and taking cranberry tabs.    Meds, vitals, and allergies reviewed.   ROS: See HPI.  Otherwise, noncontributory.  nad ncat Neck supple, no LA, resolving mild local reaction at the prev bite site.  rrr ctab abd soft, not ttp No edema

## 2012-11-14 LAB — URINE CULTURE
Colony Count: NO GROWTH
Organism ID, Bacteria: NO GROWTH

## 2012-11-15 ENCOUNTER — Encounter: Payer: Self-pay | Admitting: Family Medicine

## 2012-11-15 ENCOUNTER — Ambulatory Visit (INDEPENDENT_AMBULATORY_CARE_PROVIDER_SITE_OTHER): Payer: BC Managed Care – PPO | Admitting: Family Medicine

## 2012-11-15 VITALS — BP 120/82 | HR 76 | Temp 97.9°F | Wt 147.0 lb

## 2012-11-15 DIAGNOSIS — R350 Frequency of micturition: Secondary | ICD-10-CM

## 2012-11-15 NOTE — Patient Instructions (Addendum)
Good to see you.     Frequency may be due to bladder irritants... Drink water, avoid alcohol, caffeine, soda, citris, tomato, spicy foods.  Try to decrease these.  I would also like to refer you to urology for further work up.  Please stop by to see Shirlee Limerick on your way out to set up your referral.      Interstitial Cystitis Interstitial cystitis (IC) is a condition that results in discomfort or pain in the bladder and the surrounding pelvic region. The symptoms can be different from case to case and even in the same individual. People may experience:  Mild discomfort.  Pressure.  Tenderness.  Intense pain in the bladder and pelvic area. CAUSES  Because IC varies so much in symptoms and severity, people studying this disease believe it is not one but several diseases. Some caregivers use the term painful bladder syndrome (PBS) to describe cases with painful urinary symptoms. This may not meet the strictest definition of IC. The term IC / PBS includes all cases of urinary pain that cannot be connected to other causes, such as infection or urinary stones.  SYMPTOMS  Symptoms may include:  An urgent need to urinate.  A frequent need to urinate.  A combination of these symptoms. Pain may change in intensity as the bladder fills with urine or as it empties. Women's symptoms often get worse during menstruation. They may sometimes experience pain with vaginal intercourse. Some of the symptoms of IC / PBS seem like those of bacterial infection. Tests do not show infection. IC / PBS is far more common in women than in men.  DIAGNOSIS  The diagnosis of IC / PBS is based on:  Presence of pain related to the bladder, usually along with problems of frequency and urgency.  Not finding other diseases that could cause the symptoms.  Diagnostic tests that help rule out other diseases include:  Urinalysis.  Urine culture.  Cystoscopy.  Biopsy of the bladder wall.  Distension of the  bladder under anesthesia.  Urine cytology.  Laboratory examination of prostate secretions. A biopsy is a tissue sample that can be looked at under a microscope. Samples of the bladder and urethra may be removed during a cystoscopy. A biopsy helps rule out bladder cancer. TREATMENT  Scientists have not yet found a cure for IC / PBS. Patients with IC / PBS do not get better with antibiotic therapy. Caregivers cannot predict who will respond best to which treatment. Symptoms may disappear without explanation. Disappearing symptoms may coincide with an event such as a change in diet or treatment. Even when symptoms disappear, they may return after days, weeks, months, or years.  Because the causes of IC / PBS are unknown, current treatments are aimed at relieving symptoms. Many people are helped by one or a combination of the treatments. As researchers learn more about IC / PBS, the list of potential treatments will change. Patients should discuss their options with a caregiver. SURGERY  Surgery should be considered only if all available treatments have failed and the pain is disabling. Many approaches and techniques are used. Each approach has its own advantages and complications. Advantages and complications should be discussed with a urologist. Your caregiver may recommend consulting another urologist for a second opinion. Most caregivers are reluctant to operate because the outcome is unpredictable. Some people still have symptoms after surgery.  People considering surgery should discuss the potential risks and benefits, side effects, and long- and short-term complications with their family,  as well as with people who have already had the procedure. Surgery requires anesthesia, hospitalization, and in some cases weeks or months of recovery. As the complexity of the procedure increases, so do the chances for complications and for failure. HOME CARE INSTRUCTIONS   All drugs, even those sold over the  counter, have side effects. Patients should always consult a caregiver before using any drug for an extended amount of time. Only take over-the-counter or prescription medicines for pain, discomfort, or fever as directed by your caregiver.  Many patients feel that smoking makes their symptoms worse. How the by-products of tobacco that are excreted in the urine affect IC / PBS is unknown. Smoking is the major known cause of bladder cancer. One of the best things smokers can do for their bladder and their overall health is to quit.  Many patients feel that gentle stretching exercises help relieve IC / PBS symptoms.  Methods vary, but basically patients decide to empty their bladder at designated times and use relaxation techniques and distractions to keep to the schedule. Gradually, patients try to lengthen the time between scheduled voids. A diary in which to record voiding times is usually helpful in keeping track of progress. MAKE SURE YOU:   Understand these instructions.  Will watch your condition.  Will get help right away if you are not doing well or get worse. Document Released: 01/16/2004 Document Revised: 08/09/2011 Document Reviewed: 04/01/2008 [facility] Patient Information 2014 [place], [place].

## 2012-11-15 NOTE — Assessment & Plan Note (Signed)
H/o UTI, nontoxic, will await ucx and go from there.  She may have cleared a mild UTI on her own.

## 2012-11-15 NOTE — Progress Notes (Signed)
Subjective:    Patient ID: Kelsey Simon, female    DOB: 1955-07-22, 57 y.o.   MRN: [id number]  HPI  57 yo here to discuss recurrent UTIs.  Dysuria and increased frequency increased over past year.  Not as bad as initial episode in September.   Biggest concern is increased frequency.  No incontinence.  Urine cx pos for >100,000 pan sensitive E.Coli on when I saw her on 02/28/2012.  Has been seen a few times since then but urine cx neg on both 03/12/2012 (saw Dr. Reece Agar)  and 11/13/2012 (saw Dr. Para March).  No hematuria- gross or microscopic on past two UAs. No fevers/chills, nausea/vomiting.  Does drink two cups of coffee and tea everyday.  Loves tomatoes.   Prior to these episodes, had not had a UTI since her 69s.     Patient Active Problem List   Diagnosis Date Noted  . UTI (urinary tract infection) 08/25/2012  . 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 Grief 01/04/2012  . Hypertension    Past Medical History  Diagnosis Date  . Hypertension   . Varicose veins    Past Surgical History  Procedure Laterality Date  . Nasal septum surgery  1990   History  Substance Use Topics  . Smoking status: Former Games developer  . Smokeless tobacco: Never Used  . Alcohol Use: Yes     Comment: wine daily   Family History  Problem Relation Age of Onset  . Hypertension Mother   . Cancer Father   . Drug abuse Daughter    Allergies  Allergen Reactions  . Penicillins     REACTION: rash   Current Outpatient Prescriptions on File Prior to Visit  Medication Sig Dispense Refill  . Ascorbic Acid (VITAMIN C) 1000 MG tablet Take 1,000 mg by mouth daily.      Marland Kitchen b complex vitamins tablet Take 1 tablet by mouth daily.      . Calcium Carbonate-Vit D-Min (CALCIUM 600 + MINERALS PO) Take by mouth.      . Cholecalciferol (D3-1000 PO) Take one by mouth daily      . clonazePAM (KLONOPIN) 0.5 MG tablet Take one half by mouth as needed for sleep      . dicyclomine (BENTYL) 20 MG tablet Take 1 tablet (20 mg  total) by mouth every 6 (six) hours as needed.  30 tablet  0  . magnesium gluconate (MAGONATE) 500 MG tablet Take 500 mg by mouth daily.      . Milk Thistle 250 MG CAPS Take one by mouth daily      . pyridOXINE (VITAMIN B-6) 100 MG tablet Take 100 mg by mouth. Take by mouth 3 times a week      . triamterene-hydrochlorothiazide (MAXZIDE) 75-50 MG per tablet Take 1 tablet by mouth daily.  30 tablet  6   No current facility-administered medications on file prior to visit.   The PMH, PSH, Social History, Family History, Medications, and allergies have been reviewed in [facility], and have been updated if relevant.   Review of Systems See HPI      Objective:   Physical Exam BP 120/82  Pulse 76  Temp(Src) 97.9 F (36.6 C)  Wt 147 lb (66.679 kg)  BMI 23.21 kg/m2  General:  Well-developed,well-nourished,in no acute distress; alert,appropriate and cooperative throughout examination Head:  normocephalic and atraumatic.   Eyes:  vision grossly intact, pupils equal, pupils round, and pupils reactive to light.   Abdomen:  Bowel sounds positive,abdomen soft and non-tender  without masses, organomegaly or hernias noted. Skin:  Intact without suspicious lesions or rashes Psych:  Cognition and judgment appear intact. Alert and cooperative with normal attention span and concentration. No apparent delusions, illusions, hallucinations    Assessment & Plan:   1. Increased urinary frequency ? IC vs OAB. Discussed decreasing bladder irritants. Refer to urology.  - Ambulatory referral to Urology

## 2013-01-04 ENCOUNTER — Other Ambulatory Visit: Payer: Self-pay

## 2013-01-04 NOTE — Telephone Encounter (Signed)
Pt going out of town and pt request refill Bentyl to [facility]. Pt having same chronic pain in lt side with flatus that has had for years, no diarrhea or fever.

## 2013-01-05 MED ORDER — DICYCLOMINE HCL 20 MG PO TABS: 20.0000 mg | ORAL_TABLET | Freq: Four times a day (QID) | ORAL | Status: DC | PRN

## 2013-01-05 NOTE — Telephone Encounter (Signed)
Sent!

## 2013-03-20 ENCOUNTER — Other Ambulatory Visit: Payer: Self-pay | Admitting: *Deleted

## 2013-03-20 MED ORDER — TRIAMTERENE-HCTZ 75-50 MG PO TABS: 1.0000 | ORAL_TABLET | Freq: Every day | ORAL | Status: DC

## 2013-04-13 ENCOUNTER — Encounter: Payer: Self-pay | Admitting: Internal Medicine

## 2013-04-13 ENCOUNTER — Ambulatory Visit (INDEPENDENT_AMBULATORY_CARE_PROVIDER_SITE_OTHER): Payer: BC Managed Care – PPO | Admitting: Internal Medicine

## 2013-04-13 VITALS — BP 142/90 | HR 86 | Temp 98.2°F | Wt 153.1 lb

## 2013-04-13 DIAGNOSIS — E041 Nontoxic single thyroid nodule: Secondary | ICD-10-CM

## 2013-04-13 DIAGNOSIS — R6884 Jaw pain: Secondary | ICD-10-CM

## 2013-04-13 NOTE — Progress Notes (Signed)
Subjective:    Patient ID: Kelsey Simon, female    DOB: 10-Apr-1956, 57 y.o.   MRN: [id number]  HPI  Pt presents to the clinic today with c/o a "pulling sensation" underneath her left jaw. This started about 1 week ago. She describes it as almost like a spasms. It is not painful., just annoying. She has not taken anything OTC for it. She denies any injury to the neck or TMJ. Nothing makes it worse or better. Additionally, she is due for her thyroid ultrasound for evaluation of her thyroid nodule. She would like this ordered today.   Review of Systems      Past Medical History  Diagnosis Date  . Hypertension   . Varicose veins     Current Outpatient Prescriptions  Medication Sig Dispense Refill  . Ascorbic Acid (VITAMIN C) 1000 MG tablet Take 1,000 mg by mouth daily.      Marland Kitchen b complex vitamins tablet Take 1 tablet by mouth daily.      . Calcium Carbonate-Vit D-Min (CALCIUM 600 + MINERALS PO) Take by mouth.      . Cholecalciferol (D3-1000 PO) Take one by mouth daily      . clonazePAM (KLONOPIN) 0.5 MG tablet Take one half by mouth as needed for sleep      . dicyclomine (BENTYL) 20 MG tablet Take 1 tablet (20 mg total) by mouth every 6 (six) hours as needed.  30 tablet  0  . magnesium gluconate (MAGONATE) 500 MG tablet Take 500 mg by mouth daily.      . Milk Thistle 250 MG CAPS Take one by mouth daily      . pyridOXINE (VITAMIN B-6) 100 MG tablet Take 100 mg by mouth. Take by mouth 3 times a week      . triamterene-hydrochlorothiazide (MAXZIDE) 75-50 MG per tablet Take 0.5 tablets by mouth daily.       No current facility-administered medications for this visit.    Allergies  Allergen Reactions  . Penicillins     REACTION: rash    Family History  Problem Relation Age of Onset  . Hypertension Mother   . Cancer Father   . Drug abuse Daughter     History   Social History  . Marital Status: Married    Spouse Name: N/A    Number of Children: N/A  . Years of Education:  N/A   Occupational History  . Not on file.   Social History Main Topics  . Smoking status: Former Games developer  . Smokeless tobacco: Never Used  . Alcohol Use: Yes     Comment: wine daily  . Drug Use: No  . Sexual Activity: Not on file   Other Topics Concern  . Not on file   Social History Narrative  . No narrative on file     Constitutional: Denies fever, malaise, fatigue, headache or abrupt weight changes.  HEENT: Denies eye pain, eye redness, ear pain, ringing in the ears, wax buildup, runny nose, nasal congestion, bloody nose, or sore throat..    No other specific complaints in a complete review of systems (except as listed in HPI above).  Objective:   Physical Exam  BP 142/90  Pulse 86  Temp(Src) 98.2 F (36.8 C) (Oral)  Wt 153 lb 1.9 oz (69.455 kg)  SpO2 98% Wt Readings from Last 3 Encounters:  04/13/13 153 lb 1.9 oz (69.455 kg)  11/15/12 147 lb (66.679 kg)  11/13/12 149 lb 4 oz (67.699 kg)  General: Appears her stated age, well developed, well nourished in NAD. HEENT: Head: normal shape and size; Eyes: sclera white, no icterus, conjunctiva pink, PERRLA and EOMs intact; Ears: Tm's gray and intact, normal light reflex; Nose: mucosa pink and moist, septum midline; Throat/Mouth: Teeth present, mucosa pink and moist, no exudate, lesions or ulcerations noted.  Cardiovascular: Normal rate and rhythm. S1,S2 noted.  No murmur, rubs or gallops noted. No JVD or BLE edema. No carotid bruits noted. Pulmonary/Chest: Normal effort and positive vesicular breath sounds. No respiratory distress. No wheezes, rales or ronchi noted.  Musculoskeletal: Normal range of motion. No signs of joint swelling. No difficulty with gait.    BMET    Component Value Date/Time   NA 138 01/04/2012 1135   K 3.9 01/04/2012 1135   CL 100 01/04/2012 1135   CO2 28 01/04/2012 1135   GLUCOSE 102* 01/04/2012 1135   BUN 14 01/04/2012 1135   CREATININE 0.7 01/04/2012 1135   CALCIUM 9.4 01/04/2012 1135    Lipid  Panel     Component Value Date/Time   CHOL 228* 01/04/2012 1135   TRIG 56.0 01/04/2012 1135   HDL 97.80 01/04/2012 1135   CHOLHDL 2 01/04/2012 1135   VLDL 11.2 01/04/2012 1135           Assessment & Plan:   Abnormal sensation of jaw, ? Muscles spasm:  Does not bother her that much She would like to continue to monitor it for now and will let me know if not improved  Thyroid nodule:  Will order your ultrasound for further evaluation  RTC as needed

## 2013-04-23 ENCOUNTER — Ambulatory Visit: Payer: Self-pay | Admitting: Internal Medicine

## 2013-07-13 ENCOUNTER — Other Ambulatory Visit: Payer: Self-pay | Admitting: *Deleted

## 2013-07-13 MED ORDER — DICYCLOMINE HCL 20 MG PO TABS: 20.0000 mg | ORAL_TABLET | Freq: Four times a day (QID) | ORAL | Status: DC | PRN

## 2013-07-13 NOTE — Telephone Encounter (Signed)
Per [facility], ok to refill once.

## 2013-07-13 NOTE — Telephone Encounter (Signed)
i sent this to Dr Deborra Medina, but she is out of office today. Pt is calling back stating that she is needing refill asap as she is having an IBS flare up. Is it ok to refill?

## 2013-07-13 NOTE — Telephone Encounter (Signed)
Pt requesting medication refill. Last ov 10/2012 with no future appts scheduled. Last refill given 12/2012. pls advise

## 2013-07-27 ENCOUNTER — Encounter: Payer: Self-pay | Admitting: Family Medicine

## 2013-07-27 ENCOUNTER — Ambulatory Visit (INDEPENDENT_AMBULATORY_CARE_PROVIDER_SITE_OTHER): Payer: BC Managed Care – PPO | Admitting: Family Medicine

## 2013-07-27 VITALS — BP 142/90 | HR 74 | Temp 97.7°F | Ht 66.0 in | Wt 150.8 lb

## 2013-07-27 DIAGNOSIS — F419 Anxiety disorder, unspecified: Secondary | ICD-10-CM

## 2013-07-27 DIAGNOSIS — N390 Urinary tract infection, site not specified: Secondary | ICD-10-CM

## 2013-07-27 DIAGNOSIS — Z136 Encounter for screening for cardiovascular disorders: Secondary | ICD-10-CM

## 2013-07-27 DIAGNOSIS — I10 Essential (primary) hypertension: Secondary | ICD-10-CM

## 2013-07-27 DIAGNOSIS — Z0000 Encounter for general adult medical examination without abnormal findings: Secondary | ICD-10-CM

## 2013-07-27 DIAGNOSIS — F411 Generalized anxiety disorder: Secondary | ICD-10-CM

## 2013-07-27 LAB — CBC WITH DIFFERENTIAL/PLATELET
Basophils Absolute: 0 10*3/uL (ref 0.0–0.1)
Basophils Relative: 0 % (ref 0–1)
Eosinophils Absolute: 0.1 10*3/uL (ref 0.0–0.7)
Eosinophils Relative: 2 % (ref 0–5)
HCT: 39.6 % (ref 36.0–46.0)
Hemoglobin: 13.3 g/dL (ref 12.0–15.0)
Lymphocytes Relative: 36 % (ref 12–46)
Lymphs Abs: 2 10*3/uL (ref 0.7–4.0)
MCH: 27.7 pg (ref 26.0–34.0)
MCHC: 33.6 g/dL (ref 30.0–36.0)
MCV: 82.3 fL (ref 78.0–100.0)
Monocytes Absolute: 0.4 10*3/uL (ref 0.1–1.0)
Monocytes Relative: 8 % (ref 3–12)
Neutro Abs: 3 10*3/uL (ref 1.7–7.7)
Neutrophils Relative %: 54 % (ref 43–77)
Platelets: 317 10*3/uL (ref 150–400)
RBC: 4.81 MIL/uL (ref 3.87–5.11)
RDW: 13.4 % (ref 11.5–15.5)
WBC: 5.5 10*3/uL (ref 4.0–10.5)

## 2013-07-27 LAB — COMPREHENSIVE METABOLIC PANEL
ALT: 18 U/L (ref 0–35)
AST: 21 U/L (ref 0–37)
Albumin: 4.3 g/dL (ref 3.5–5.2)
Alkaline Phosphatase: 40 U/L (ref 39–117)
BUN: 13 mg/dL (ref 6–23)
CO2: 30 mEq/L (ref 19–32)
Calcium: 9.3 mg/dL (ref 8.4–10.5)
Chloride: 98 mEq/L (ref 96–112)
Creat: 0.83 mg/dL (ref 0.50–1.10)
Glucose, Bld: 90 mg/dL (ref 70–99)
Potassium: 3.6 mEq/L (ref 3.5–5.3)
Sodium: 135 mEq/L (ref 135–145)
Total Bilirubin: 0.4 mg/dL (ref 0.2–1.2)
Total Protein: 6.5 g/dL (ref 6.0–8.3)

## 2013-07-27 LAB — LIPID PANEL
Cholesterol: 181 mg/dL (ref 0–200)
HDL: 77 mg/dL (ref >39)
LDL Cholesterol: 81 mg/dL (ref 0–99)
Total CHOL/HDL Ratio: 2.4 Ratio
Triglycerides: 116 mg/dL (ref <150)
VLDL: 23 mg/dL (ref 0–40)

## 2013-07-27 LAB — TSH: TSH: 0.935 u[IU]/mL (ref 0.350–4.500)

## 2013-07-27 NOTE — Assessment & Plan Note (Signed)
With recurrent UTIs.  Improved with estrace.

## 2013-07-27 NOTE — Progress Notes (Signed)
Subjective:    Patient ID: Kelsey Simon, female    DOB: [birth year], 58 y.o.   MRN: [id number]  HPI  58 yo pleasant female here for follow up.    HTN- Denies any HA, blurred vision, CP or SOB. Has been on current dose of Maxzide for years.   BP Readings from Last 3 Encounters:  07/27/13 142/90  04/13/13 142/90  11/15/12 120/82    Anxiety- feels she is handling things ok without therapy.  Was seeing Dr. Eulas Post but stopped going.  Daughter is now in prison and she is happy she is safe- off drugs.  She is caring for her 38 year old grand daughter. Husband very supportive.  Recurrent UTIs/ OAB-  Saw urology and placed on estrace.  Symptoms much improved.  Due for labs.   Patient Active Problem List   Diagnosis Date Noted  . UTI (urinary tract infection) 08/25/2012  . 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 Grief 01/04/2012  . Hypertension    Past Medical History  Diagnosis Date  . Hypertension   . Varicose veins    Past Surgical History  Procedure Laterality Date  . Nasal septum surgery  1990   History  Substance Use Topics  . Smoking status: Former Research scientist (life sciences)  . Smokeless tobacco: Never Used  . Alcohol Use: Yes     Comment: wine daily   Family History  Problem Relation Age of Onset  . Hypertension Mother   . Cancer Father   . Drug abuse Daughter    Allergies  Allergen Reactions  . Penicillins     REACTION: rash   Current Outpatient Prescriptions on File Prior to Visit  Medication Sig Dispense Refill  . Ascorbic Acid (VITAMIN C) 1000 MG tablet Take 1,000 mg by mouth daily.      Marland Kitchen b complex vitamins tablet Take 1 tablet by mouth daily.      . Calcium Carbonate-Vit D-Min (CALCIUM 600 + MINERALS PO) Take by mouth.      . Cholecalciferol (D3-1000 PO) Take one by mouth daily      . clonazePAM (KLONOPIN) 0.5 MG tablet Take one half by mouth as needed for sleep      . dicyclomine (BENTYL) 20 MG tablet Take 1 tablet (20 mg total) by mouth every 6 (six) hours as needed.   30 tablet  0  . magnesium gluconate (MAGONATE) 500 MG tablet Take 500 mg by mouth daily.      . Milk Thistle 250 MG CAPS Take one by mouth daily      . pyridOXINE (VITAMIN B-6) 100 MG tablet Take 100 mg by mouth. Take by mouth 3 times a week      . triamterene-hydrochlorothiazide (MAXZIDE) 75-50 MG per tablet Take 0.5 tablets by mouth daily.       No current facility-administered medications on file prior to visit.   The PMH, PSH, Social History, Family History, Medications, and allergies have been reviewed in [facility], and have been updated if relevant.   Review of Systems See HPI Patient reports no  vision/ hearing changes,anorexia, fever ,adenopathy, persistant / recurrent hoarseness, swallowing issues, chest pain, edema,persistant / recurrent cough, hemoptysis, dyspnea(rest, exertional, paroxysmal nocturnal), gastrointestinal  bleeding (melena, rectal bleeding), abdominal pain, excessive heart burn, GU symptoms(dysuria, hematuria, pyuria, voiding/incontinence  Issues) syncope, focal weakness, severe memory loss, concerning skin lesions,  abnormal bruising/bleeding, major joint swelling, breast masses or abnormal vaginal bleeding.       Objective:   Physical Exam BP 142/90  Pulse 74  Temp(Src) 97.7 F (36.5 C) (Oral)  Ht 5\' 6"  (1.676 m)  Wt 150 lb 12 oz (68.38 kg)  BMI 24.34 kg/m2  SpO2 98%  General:  Well-developed,well-nourished,in no acute distress; alert,appropriate and cooperative throughout examination Head:  normocephalic and atraumatic.   Eyes:  vision grossly intact, pupils equal, pupils round, and pupils reactive to light.   Ears:  R ear normal and L ear normal.   Nose:  no external deformity.   Mouth:  good dentition.   Lungs:  Normal respiratory effort, chest expands symmetrically. Lungs are clear to auscultation, no crackles or wheezes. Heart:  Normal rate and regular rhythm. S1 and S2 normal without gallop, murmur, click, rub or other extra sounds. Psych:  Cognition and  judgment appear intact. Alert and cooperative with normal attention span and concentration. No apparent delusions, illusions, hallucinations        Assessment & Plan:

## 2013-07-27 NOTE — Assessment & Plan Note (Signed)
Stressors continue but she feels she is managing this well.

## 2013-07-27 NOTE — Progress Notes (Signed)
Pre visit review using our clinic review tool, if applicable. No additional management support is needed unless otherwise documented below in the visit note. 

## 2013-07-27 NOTE — Assessment & Plan Note (Signed)
Reasonable control. No changes.

## 2013-07-27 NOTE — Patient Instructions (Signed)
Great to see you. Hang in there.  I will call you with your lab results.  Set up your pap smear at your convenience.

## 2013-07-30 ENCOUNTER — Telehealth: Payer: Self-pay | Admitting: Family Medicine

## 2013-07-30 ENCOUNTER — Encounter: Payer: Self-pay | Admitting: Family Medicine

## 2013-07-30 NOTE — Telephone Encounter (Signed)
Relevant patient education assigned to patient using Emmi. ° °

## 2013-08-20 ENCOUNTER — Other Ambulatory Visit: Payer: Self-pay

## 2013-08-20 DIAGNOSIS — Z1231 Encounter for screening mammogram for malignant neoplasm of breast: Secondary | ICD-10-CM

## 2013-10-04 ENCOUNTER — Ambulatory Visit
Admission: RE | Admit: 2013-10-04 | Discharge: 2013-10-04 | Disposition: A | Discharge status: Home / Self Care | Payer: BC Managed Care – PPO | Source: Ambulatory Visit

## 2013-10-04 ENCOUNTER — Ambulatory Visit: Payer: BC Managed Care – PPO

## 2013-10-04 DIAGNOSIS — Z1231 Encounter for screening mammogram for malignant neoplasm of breast: Secondary | ICD-10-CM

## 2013-12-20 ENCOUNTER — Other Ambulatory Visit: Payer: Self-pay | Admitting: *Deleted

## 2013-12-20 MED ORDER — DICYCLOMINE HCL 20 MG PO TABS: 20.0000 mg | ORAL_TABLET | Freq: Four times a day (QID) | ORAL | Status: DC | PRN

## 2014-02-21 ENCOUNTER — Encounter: Payer: Self-pay | Admitting: Internal Medicine

## 2014-03-04 ENCOUNTER — Telehealth: Payer: Self-pay | Admitting: *Deleted

## 2014-03-04 NOTE — Telephone Encounter (Signed)
Spoke to pt and scheduled f/u appt. Rx to be filled at appt

## 2014-03-04 NOTE — Telephone Encounter (Signed)
Fax received indicating pt wanting refill of klonopin. Pts last Rx for this med was 2013 and per Dr Deborra Medina, she is needing an OV before she is able to restart med.

## 2014-03-06 ENCOUNTER — Encounter: Payer: Self-pay | Admitting: Family Medicine

## 2014-03-06 ENCOUNTER — Ambulatory Visit (INDEPENDENT_AMBULATORY_CARE_PROVIDER_SITE_OTHER): Payer: BC Managed Care – PPO | Admitting: Family Medicine

## 2014-03-06 VITALS — BP 142/88 | HR 87 | Temp 98.0°F | Wt 149.2 lb

## 2014-03-06 DIAGNOSIS — F419 Anxiety disorder, unspecified: Secondary | ICD-10-CM

## 2014-03-06 DIAGNOSIS — I10 Essential (primary) hypertension: Secondary | ICD-10-CM

## 2014-03-06 MED ORDER — CLONAZEPAM 0.5 MG PO TABS: ORAL_TABLET | ORAL | Status: DC

## 2014-03-06 NOTE — Assessment & Plan Note (Signed)
Well controlled on current rx. No changes. 

## 2014-03-06 NOTE — Progress Notes (Signed)
Subjective:    Patient ID: Kelsey Simon, female    DOB: April 11, 1956, 58 y.o.   MRN: [id number]  HPI  58 yo pleasant female here for follow up.    HTN- Denies any HA, blurred vision, CP or SOB. Has been on current dose of Maxzide for years.   BP Readings from Last 3 Encounters:  03/06/14 142/88  07/27/13 142/90  04/13/13 142/90    Anxiety- feels she is handling things ok without therapy.  Was seeing Dr. Eulas Post but stopped going.  Daughter is now in prison and she is happy she is safe- off drugs and she is getting out in June!  She is caring for her 31 year old grand daughter and they have a transition planned for when her daughter comes home. Husband very supportive. Using Klonopin occasionally for insomnia.  Recurrent UTIs/ OAB-  Saw urology and placed on estrace.  Symptoms much improved.    Patient Active Problem List   Diagnosis Date Noted  . OAB (overactive bladder) 08/25/2012  . 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 Anxiety 01/04/2012  . Hypertension    Past Medical History  Diagnosis Date  . Hypertension   . Varicose veins    Past Surgical History  Procedure Laterality Date  . Nasal septum surgery  1990   History  Substance Use Topics  . Smoking status: Former Research scientist (life sciences)  . Smokeless tobacco: Never Used  . Alcohol Use: Yes     Comment: wine daily   Family History  Problem Relation Age of Onset  . Hypertension Mother   . Cancer Father   . Drug abuse Daughter    Allergies  Allergen Reactions  . Penicillins     REACTION: rash   Current Outpatient Prescriptions on File Prior to Visit  Medication Sig Dispense Refill  . Ascorbic Acid (VITAMIN C) 1000 MG tablet Take 1,000 mg by mouth daily.      Marland Kitchen b complex vitamins tablet Take 1 tablet by mouth daily.      . Calcium Carbonate-Vit D-Min (CALCIUM 600 + MINERALS PO) Take by mouth.      . Cholecalciferol (D3-1000 PO) Take one by mouth daily      . clonazePAM (KLONOPIN) 0.5 MG tablet Take one half by mouth as  needed for sleep      . dicyclomine (BENTYL) 20 MG tablet Take 1 tablet (20 mg total) by mouth every 6 (six) hours as needed.  30 tablet  0  . magnesium gluconate (MAGONATE) 500 MG tablet Take 500 mg by mouth daily.      . Milk Thistle 250 MG CAPS Take one by mouth daily      . pyridOXINE (VITAMIN B-6) 100 MG tablet Take 100 mg by mouth. Take by mouth 3 times a week      . triamterene-hydrochlorothiazide (MAXZIDE) 75-50 MG per tablet Take 0.5 tablets by mouth daily.       No current facility-administered medications on file prior to visit.   The PMH, PSH, Social History, Family History, Medications, and allergies have been reviewed in [facility], and have been updated if relevant.   Review of Systems See HPI Denies any anxiety or depression Intermittent insomnia but that has been better as well. Appetite good- weight stable Wt Readings from Last 3 Encounters:  03/06/14 149 lb 4 oz (67.699 kg)  07/27/13 150 lb 12 oz (68.38 kg)  04/13/13 153 lb 1.9 oz (69.455 kg)  No panic attacks     Objective:  Physical Exam BP 142/88  Pulse 87  Temp(Src) 98 F (36.7 C) (Oral)  Wt 149 lb 4 oz (67.699 kg)  SpO2 99%  General:  Well-developed,well-nourished,in no acute distress; alert,appropriate and cooperative throughout examination Head:  normocephalic and atraumatic.   Mouth:  good dentition.   Lungs:  Normal respiratory effort, chest expands symmetrically. Lungs are clear to auscultation, no crackles or wheezes. Heart:  Normal rate and regular rhythm. S1 and S2 normal without gallop, murmur, click, rub or other extra sounds. Psych:  Cognition and judgment appear intact. Alert and cooperative with normal attention span and concentration. No apparent delusions, illusions, hallucinations    Assessment & Plan:

## 2014-03-06 NOTE — Assessment & Plan Note (Signed)
>  15 minutes spent in face to face time with patient, >50% spent in counselling or coordination of care Using Klonopin appropriately. Rx refilled.

## 2014-03-06 NOTE — Progress Notes (Signed)
Pre visit review using our clinic review tool, if applicable. No additional management support is needed unless otherwise documented below in the visit note. 

## 2014-06-28 ENCOUNTER — Encounter: Payer: Self-pay | Admitting: Family Medicine

## 2014-06-28 ENCOUNTER — Ambulatory Visit (INDEPENDENT_AMBULATORY_CARE_PROVIDER_SITE_OTHER): Payer: BLUE CROSS/BLUE SHIELD | Admitting: Family Medicine

## 2014-06-28 VITALS — BP 153/98 | HR 81 | Temp 97.7°F | Ht 66.0 in | Wt 150.5 lb

## 2014-06-28 DIAGNOSIS — J069 Acute upper respiratory infection, unspecified: Secondary | ICD-10-CM

## 2014-06-28 DIAGNOSIS — B9789 Other viral agents as the cause of diseases classified elsewhere: Principal | ICD-10-CM

## 2014-06-28 LAB — POCT INFLUENZA A/B
Influenza A, POC: NEGATIVE
Influenza B, POC: NEGATIVE

## 2014-06-28 MED ORDER — DICYCLOMINE HCL 20 MG PO TABS: 20.0000 mg | ORAL_TABLET | Freq: Four times a day (QID) | ORAL | Status: DC | PRN

## 2014-06-28 MED ORDER — GUAIFENESIN-CODEINE 100-10 MG/5ML PO SYRP: 5.0000 mL | ORAL_SOLUTION | Freq: Every evening | ORAL | Status: DC | PRN

## 2014-06-28 NOTE — Patient Instructions (Signed)
Rest, fluids.  Mucinex DM during the day, cough suppressant at night.  Call if not improving in next 7-10 days as expected.

## 2014-06-28 NOTE — Progress Notes (Signed)
   Subjective:    Patient ID: Kelsey Simon, female    DOB: 01-14-1956, 59 y.o.   MRN: [id number]  Cough This is a new problem. The current episode started in the past 7 days (3 days). The problem has been rapidly worsening. The cough is productive of sputum (clear mucus). Associated symptoms include a fever, headaches, myalgias, rhinorrhea and a sore throat. Pertinent negatives include no ear pain, hemoptysis, nasal congestion or postnasal drip. Associated symptoms comments: Body ache  Subjective fever and chills. The symptoms are aggravated by lying down. Risk factors: remote smoking history. Treatments tried: nasal saline, alkaseltzer  The treatment provided mild relief. Her past medical history is significant for environmental allergies. There is no history of asthma, bronchiectasis, bronchitis, COPD, emphysema or pneumonia.   No known sick contacts, work with the public  Cough keeping her up at night.. reuest codeine cough syrup.  Review of Systems  Constitutional: Positive for fever.  HENT: Positive for rhinorrhea and sore throat. Negative for ear pain and postnasal drip.   Respiratory: Positive for cough. Negative for hemoptysis.   Musculoskeletal: Positive for myalgias.  Allergic/Immunologic: Positive for environmental allergies.  Neurological: Positive for headaches.       Objective:   Physical Exam  Constitutional: Vital signs are normal. She appears well-developed and well-nourished. She is cooperative.  Non-toxic appearance. She does not appear ill. No distress.  HENT:  Head: Normocephalic.  Right Ear: Hearing, tympanic membrane, external ear and ear canal normal. Tympanic membrane is not erythematous, not retracted and not bulging.  Left Ear: Hearing, tympanic membrane, external ear and ear canal normal. Tympanic membrane is not erythematous, not retracted and not bulging.  Nose: Mucosal edema and rhinorrhea present. Right sinus exhibits no maxillary sinus tenderness and  no frontal sinus tenderness. Left sinus exhibits no maxillary sinus tenderness and no frontal sinus tenderness.  Mouth/Throat: Uvula is midline, oropharynx is clear and moist and mucous membranes are normal.  Eyes: Conjunctivae, EOM and lids are normal. Pupils are equal, round, and reactive to light. Lids are everted and swept, no foreign bodies found.  Neck: Trachea normal and normal range of motion. Neck supple. Carotid bruit is not present. No thyroid mass and no thyromegaly present.  Cardiovascular: Normal rate, regular rhythm, S1 normal, S2 normal, normal heart sounds, intact distal pulses and normal pulses.  Exam reveals no gallop and no friction rub.   No murmur heard. Pulmonary/Chest: Effort normal and breath sounds normal. No tachypnea. No respiratory distress. She has no decreased breath sounds. She has no wheezes. She has no rhonchi. She has no rales.  Neurological: She is alert.  Skin: Skin is warm, dry and intact. No rash noted.  Psychiatric: Her speech is normal and behavior is normal. Judgment normal. Her mood appears not anxious. Cognition and memory are normal. She does not exhibit a depressed mood.          Assessment & Plan:

## 2014-06-28 NOTE — Assessment & Plan Note (Addendum)
FLu test negative. Symptomatic care, cough suppressant at night.

## 2014-06-28 NOTE — Progress Notes (Signed)
Pre visit review using our clinic review tool, if applicable. No additional management support is needed unless otherwise documented below in the visit note. 

## 2014-06-28 NOTE — Addendum Note (Signed)
Addended by: Carter Kitten on: 06/28/2014 11:52 AM   Modules accepted: Orders

## 2014-07-01 ENCOUNTER — Other Ambulatory Visit: Payer: Self-pay | Admitting: Family Medicine

## 2014-09-02 ENCOUNTER — Other Ambulatory Visit: Payer: Self-pay

## 2014-09-02 DIAGNOSIS — Z1231 Encounter for screening mammogram for malignant neoplasm of breast: Secondary | ICD-10-CM

## 2014-10-08 ENCOUNTER — Ambulatory Visit
Admission: RE | Admit: 2014-10-08 | Discharge: 2014-10-08 | Disposition: A | Discharge status: Home / Self Care | Payer: BLUE CROSS/BLUE SHIELD | Source: Ambulatory Visit

## 2014-10-08 DIAGNOSIS — Z1231 Encounter for screening mammogram for malignant neoplasm of breast: Secondary | ICD-10-CM

## 2014-11-26 ENCOUNTER — Other Ambulatory Visit: Payer: Self-pay | Admitting: Family Medicine

## 2014-11-26 NOTE — Telephone Encounter (Signed)
Last office visit 06/28/2014 with Dr. Diona Browner.  Last refilled 06/28/2014 for #30 with no refills.  Ok to refill?

## 2015-01-09 ENCOUNTER — Other Ambulatory Visit: Payer: Self-pay | Admitting: Family Medicine

## 2015-03-27 ENCOUNTER — Encounter: Payer: Self-pay | Admitting: Family Medicine

## 2015-03-27 ENCOUNTER — Ambulatory Visit (INDEPENDENT_AMBULATORY_CARE_PROVIDER_SITE_OTHER): Payer: BLUE CROSS/BLUE SHIELD | Admitting: Family Medicine

## 2015-03-27 VITALS — BP 146/90 | HR 71 | Temp 97.8°F | Wt 147.8 lb

## 2015-03-27 DIAGNOSIS — F419 Anxiety disorder, unspecified: Secondary | ICD-10-CM | POA: Diagnosis not present

## 2015-03-27 DIAGNOSIS — I10 Essential (primary) hypertension: Secondary | ICD-10-CM | POA: Diagnosis not present

## 2015-03-27 LAB — COMPREHENSIVE METABOLIC PANEL
ALT: 25 U/L (ref 0–35)
AST: 25 U/L (ref 0–37)
Albumin: 4.2 g/dL (ref 3.5–5.2)
Alkaline Phosphatase: 37 U/L — ABNORMAL LOW (ref 39–117)
BUN: 14 mg/dL (ref 6–23)
CO2: 31 mEq/L (ref 19–32)
Calcium: 9.8 mg/dL (ref 8.4–10.5)
Chloride: 101 mEq/L (ref 96–112)
Creatinine, Ser: 0.85 mg/dL (ref 0.40–1.20)
GFR: 72.26 mL/min (ref >60.00)
Glucose, Bld: 98 mg/dL (ref 70–99)
Potassium: 4.1 mEq/L (ref 3.5–5.1)
Sodium: 138 mEq/L (ref 135–145)
Total Bilirubin: 0.5 mg/dL (ref 0.2–1.2)
Total Protein: 7 g/dL (ref 6.0–8.3)

## 2015-03-27 MED ORDER — TRIAMTERENE-HCTZ 75-50 MG PO TABS: ORAL_TABLET | ORAL | Status: DC

## 2015-03-27 NOTE — Assessment & Plan Note (Signed)
Well controlled. Exercise and as needed klonipin effective. No changes made to rxs today.

## 2015-03-27 NOTE — Progress Notes (Signed)
Subjective:    Patient ID: Kelsey Simon, female    DOB: 09/18/1955, 59 y.o.   MRN: [id number]  HPI  59 yo pleasant female here for follow up.  HTN- Denies any HA, blurred vision, CP or SOB. Has been on current dose of Maxzide for years.   BP Readings from Last 3 Encounters:  03/27/15 146/90  06/28/14 153/98  03/06/14 142/88   Lab Results  Component Value Date   CREATININE 0.83 07/27/2013    Anxiety- she is doing very well.  Her daughter is home, working and bonding with her daughters.  She is also exercising regularly.   Denies any symptoms of anxiety or depression.  Using Klonopin occasionally for insomnia.   Patient Active Problem List   Diagnosis Date Noted  . Viral URI with cough 06/28/2014  . OAB (overactive bladder) 08/25/2012  . 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 Anxiety 01/04/2012  . Hypertension    Past Medical History  Diagnosis Date  . Hypertension   . Varicose veins    Past Surgical History  Procedure Laterality Date  . Nasal septum surgery  1990   Social History  Substance Use Topics  . Smoking status: Former Research scientist (life sciences)  . Smokeless tobacco: Never Used  . Alcohol Use: Yes     Comment: wine daily   Family History  Problem Relation Age of Onset  . Hypertension Mother   . Cancer Father   . Drug abuse Daughter    Allergies  Allergen Reactions  . Penicillins     REACTION: rash   Current Outpatient Prescriptions on File Prior to Visit  Medication Sig Dispense Refill  . Ascorbic Acid (VITAMIN C) 1000 MG tablet Take 1,000 mg by mouth daily.    Marland Kitchen b complex vitamins tablet Take 1 tablet by mouth daily.    . Calcium Carbonate-Vit D-Min (CALCIUM 600 + MINERALS PO) Take by mouth.    . Cholecalciferol (D3-1000 PO) Take one by mouth daily    . clonazePAM (KLONOPIN) 0.5 MG tablet Take one half by mouth as needed for sleep 30 tablet 1  . dicyclomine (BENTYL) 20 MG tablet TAKE ONE (1) TABLET EVERY 6 HOURS AS NEEDED 30 tablet 0  . magnesium gluconate  (MAGONATE) 500 MG tablet Take 500 mg by mouth daily.    . Milk Thistle 250 MG CAPS Take one by mouth daily    . pyridOXINE (VITAMIN B-6) 100 MG tablet Take 100 mg by mouth. Take by mouth 3 times a week     No current facility-administered medications on file prior to visit.   The PMH, PSH, Social History, Family History, Medications, and allergies have been reviewed in [facility], and have been updated if relevant.   Review of Systems  Constitutional: Negative.   HENT: Negative.   Respiratory: Negative.   Cardiovascular: Negative.   Genitourinary: Negative.   Musculoskeletal: Negative.   Neurological: Negative.   Hematological: Negative.   Psychiatric/Behavioral: Negative.   All other systems reviewed and are negative.      Objective:   BP 146/90 mmHg  Pulse 71  Temp(Src) 97.8 F (36.6 C) (Oral)  Wt 147 lb 12 oz (67.019 kg)  SpO2 100%  Physical Exam  Constitutional: She is oriented to person, place, and time and well-developed, well-nourished, and in no distress. No distress.  HENT:  Head: Normocephalic and atraumatic.  Eyes: Conjunctivae are normal.  Neck: Normal range of motion.  Cardiovascular: Normal rate, regular rhythm and normal heart sounds.   Pulmonary/Chest: Effort  normal and breath sounds normal. No respiratory distress. She has no wheezes.  Musculoskeletal: Normal range of motion. She exhibits no edema.  Neurological: She is alert and oriented to person, place, and time.  Skin: Skin is warm and dry. She is not diaphoretic.  Psychiatric: Mood, memory, affect and judgment normal.  Nursing note and vitals reviewed.      Assessment & Plan:

## 2015-03-27 NOTE — Assessment & Plan Note (Signed)
Well controlled. Mildly elevated but rushed to get here- normotensive at home. No changes made. Due for labs today- Orders Placed This Encounter  Procedures  . Comprehensive metabolic panel

## 2015-03-27 NOTE — Progress Notes (Signed)
Pre visit review using our clinic review tool, if applicable. No additional management support is needed unless otherwise documented below in the visit note. 

## 2015-07-19 ENCOUNTER — Other Ambulatory Visit: Payer: Self-pay | Admitting: Family Medicine

## 2015-07-21 NOTE — Telephone Encounter (Signed)
Received refill request electronically Last refill Dicyclomine 11/26/14 #30/refill Last refill Clonazepam 03/26/14 #30/1

## 2015-07-21 NOTE — Telephone Encounter (Signed)
Rx called to pharmacy as instructed. 

## 2015-08-14 ENCOUNTER — Telehealth: Payer: Self-pay

## 2015-08-14 ENCOUNTER — Other Ambulatory Visit: Payer: Self-pay

## 2015-08-14 DIAGNOSIS — Z1231 Encounter for screening mammogram for malignant neoplasm of breast: Secondary | ICD-10-CM

## 2015-08-14 NOTE — Telephone Encounter (Signed)
Yearly mammograms are fine

## 2015-08-14 NOTE — Telephone Encounter (Signed)
Pt left v/m; pt saw on my chart under health maintenance that mammogram suggested in 2 years and pt is wanting a mammogram yearly. Per 10/08/14 screening mammogram recommendation is for pt to have screening mammogram in one year. I spoke with pt and she has already called [facility] and scheduled screening mammogram for 10/09/15. Pt does want note sent to Dr Deborra Medina to be aware of this. FYI to Dr Deborra Medina.

## 2015-10-09 ENCOUNTER — Ambulatory Visit
Admission: RE | Admit: 2015-10-09 | Discharge: 2015-10-09 | Disposition: A | Discharge status: Home / Self Care | Payer: BLUE CROSS/BLUE SHIELD | Source: Ambulatory Visit

## 2015-10-09 DIAGNOSIS — Z1231 Encounter for screening mammogram for malignant neoplasm of breast: Secondary | ICD-10-CM | POA: Diagnosis not present

## 2015-10-10 ENCOUNTER — Other Ambulatory Visit: Payer: Self-pay | Admitting: Family Medicine

## 2015-10-10 DIAGNOSIS — R928 Other abnormal and inconclusive findings on diagnostic imaging of breast: Secondary | ICD-10-CM

## 2015-10-17 ENCOUNTER — Other Ambulatory Visit: Payer: Self-pay | Admitting: Family Medicine

## 2015-10-17 ENCOUNTER — Ambulatory Visit
Admission: RE | Admit: 2015-10-17 | Discharge: 2015-10-17 | Disposition: A | Discharge status: Home / Self Care | Payer: BLUE CROSS/BLUE SHIELD | Source: Ambulatory Visit | Attending: Family Medicine | Admitting: Family Medicine

## 2015-10-17 DIAGNOSIS — R928 Other abnormal and inconclusive findings on diagnostic imaging of breast: Secondary | ICD-10-CM

## 2015-10-17 DIAGNOSIS — R921 Mammographic calcification found on diagnostic imaging of breast: Secondary | ICD-10-CM | POA: Diagnosis not present

## 2015-10-17 DIAGNOSIS — N6489 Other specified disorders of breast: Secondary | ICD-10-CM | POA: Diagnosis not present

## 2015-11-04 ENCOUNTER — Ambulatory Visit
Admission: RE | Admit: 2015-11-04 | Discharge: 2015-11-04 | Disposition: A | Discharge status: Home / Self Care | Payer: BLUE CROSS/BLUE SHIELD | Source: Ambulatory Visit | Attending: Family Medicine | Admitting: Family Medicine

## 2015-11-04 ENCOUNTER — Other Ambulatory Visit: Payer: Self-pay | Admitting: Family Medicine

## 2015-11-04 DIAGNOSIS — N6011 Diffuse cystic mastopathy of right breast: Secondary | ICD-10-CM | POA: Diagnosis not present

## 2015-11-04 DIAGNOSIS — R928 Other abnormal and inconclusive findings on diagnostic imaging of breast: Secondary | ICD-10-CM

## 2015-11-04 DIAGNOSIS — R921 Mammographic calcification found on diagnostic imaging of breast: Secondary | ICD-10-CM | POA: Diagnosis not present

## 2015-11-12 ENCOUNTER — Other Ambulatory Visit: Payer: Self-pay | Admitting: General Surgery

## 2015-11-12 DIAGNOSIS — R921 Mammographic calcification found on diagnostic imaging of breast: Secondary | ICD-10-CM | POA: Diagnosis not present

## 2015-12-16 ENCOUNTER — Other Ambulatory Visit: Payer: Self-pay | Admitting: General Surgery

## 2015-12-16 DIAGNOSIS — R921 Mammographic calcification found on diagnostic imaging of breast: Secondary | ICD-10-CM

## 2016-01-07 ENCOUNTER — Other Ambulatory Visit: Payer: Self-pay | Admitting: Family Medicine

## 2016-01-13 ENCOUNTER — Encounter: Payer: Self-pay | Admitting: Family Medicine

## 2016-01-13 ENCOUNTER — Ambulatory Visit (INDEPENDENT_AMBULATORY_CARE_PROVIDER_SITE_OTHER): Payer: BLUE CROSS/BLUE SHIELD | Admitting: Family Medicine

## 2016-01-13 VITALS — BP 170/100 | HR 76 | Temp 98.2°F | Wt 147.5 lb

## 2016-01-13 DIAGNOSIS — I10 Essential (primary) hypertension: Secondary | ICD-10-CM

## 2016-01-13 DIAGNOSIS — F419 Anxiety disorder, unspecified: Secondary | ICD-10-CM | POA: Diagnosis not present

## 2016-01-13 DIAGNOSIS — M546 Pain in thoracic spine: Secondary | ICD-10-CM | POA: Diagnosis not present

## 2016-01-13 MED ORDER — TRIAMTERENE-HCTZ 75-50 MG PO TABS
1.0000 | ORAL_TABLET | Freq: Every day | ORAL | 3 refills | Status: DC
Start: 2016-01-13 — End: 2017-02-08

## 2016-01-13 MED ORDER — CYCLOBENZAPRINE HCL 5 MG PO TABS
5.0000 mg | ORAL_TABLET | Freq: Three times a day (TID) | ORAL | 1 refills | Status: DC | PRN
Start: 2016-01-13 — End: 2016-03-24

## 2016-01-13 NOTE — Assessment & Plan Note (Signed)
New with palpable spasm. Advised continued NSAIDs as needed with food, eRx sent for flexeril to use as needed.  Discussed sedation precautions. Advised to stay active. Call or return to clinic prn if these symptoms worsen or fail to improve as anticipated. The patient indicates understanding of these issues and agrees with the plan.

## 2016-01-13 NOTE — Assessment & Plan Note (Signed)
Deteriorated here today but has been out of rx and she is in pain. Asymptomatic. BP medication eRx refills sent. She will check BP at work and keep me updated. The patient indicates understanding of these issues and agrees with the plan.

## 2016-01-13 NOTE — Progress Notes (Signed)
Subjective:    Patient ID: Kelsey Simon, female    DOB: 04/18/1956, 60 y.o.   MRN: [id number]  HPI  60 yo pleasant female here for follow up.  Back pain- thoracic back pain (left) since she lifted weights at the gym the other night.  No low back pain.  No radiculopathy or urinary symptoms.  Alleve has helped some.  HTN- Denies any HA, blurred vision, CP or SOB. Has been on current dose of Maxzide for years. BP very elevated today but ran out of rx and has back pain.  BP at work has been ranging in [id number] systolic.   BP Readings from Last 3 Encounters:  01/13/16 (!) 170/100  03/27/15 (!) 146/90  06/28/14 (!) 153/98   Lab Results  Component Value Date   CREATININE 0.85 03/27/2015    Anxiety- she is doing very well.  Denies any symptoms of anxiety or depression.  Using Klonopin occasionally for insomnia.   Patient Active Problem List  Diagnosis  . Hypertension  . Anxiety  . Varicose vein of leg  . OAB (overactive bladder)   Past Medical History:  Diagnosis Date  . Hypertension   . Varicose veins    Past Surgical History:  Procedure Laterality Date  . NASAL SEPTUM SURGERY  1990   Social History  Substance Use Topics  . Smoking status: Former Research scientist (life sciences)  . Smokeless tobacco: Never Used  . Alcohol use Yes     Comment: wine daily   Family History  Problem Relation Age of Onset  . Hypertension Mother   . Cancer Father   . Drug abuse Daughter    Allergies  Allergen Reactions  . Penicillins     REACTION: rash   Current Outpatient Prescriptions on File Prior to Visit  Medication Sig Dispense Refill  . Ascorbic Acid (VITAMIN C) 1000 MG tablet Take 1,000 mg by mouth daily.    Marland Kitchen b complex vitamins tablet Take 1 tablet by mouth daily.    . Calcium Carbonate-Vit D-Min (CALCIUM 600 + MINERALS PO) Take by mouth.    . Cholecalciferol (D3-1000 PO) Take one by mouth daily    . clonazePAM (KLONOPIN) 0.5 MG tablet TAKE ONE-HALF TABLET BY MOUTH AS NEEDED FOR SLEEP  30 tablet 1  . dicyclomine (BENTYL) 20 MG tablet TAKE ONE TABLET EVERY 6 HOURS AS NEEDED 30 tablet 0  . magnesium gluconate (MAGONATE) 500 MG tablet Take 500 mg by mouth daily.    . Milk Thistle 250 MG CAPS Take one by mouth daily    . pyridOXINE (VITAMIN B-6) 100 MG tablet Take 100 mg by mouth. Take by mouth 3 times a week    . triamterene-hydrochlorothiazide (MAXZIDE) 75-50 MG tablet Take 1 tablet by mouth daily. OFFICE VISIT REQUIRED FOR ADDITIONAL REFILLS 90 tablet 0   No current facility-administered medications on file prior to visit.    The PMH, PSH, Social History, Family History, Medications, and allergies have been reviewed in [facility], and have been updated if relevant.   Review of Systems  Constitutional: Negative.   HENT: Negative.   Respiratory: Negative.   Cardiovascular: Negative.   Genitourinary: Negative.   Musculoskeletal: Positive for back pain. Negative for gait problem.  Neurological: Negative.   Hematological: Negative.   Psychiatric/Behavioral: Negative.   All other systems reviewed and are negative.      Objective:   BP (!) 170/100   Pulse 76   Temp 98.2 F (36.8 C) (Oral)   Wt 147 lb 8 oz (66.9  kg)   SpO2 99%   BMI 23.81 kg/m   Physical Exam  Constitutional: She is oriented to person, place, and time and well-developed, well-nourished, and in no distress. No distress.  HENT:  Head: Normocephalic and atraumatic.  Eyes: Conjunctivae are normal.  Neck: Normal range of motion.  Cardiovascular: Normal rate, regular rhythm and normal heart sounds.   Pulmonary/Chest: Effort normal and breath sounds normal. No respiratory distress. She has no wheezes.  Musculoskeletal: Normal range of motion. She exhibits no edema.       Thoracic back: She exhibits spasm. She exhibits normal range of motion, no tenderness, no bony tenderness, no swelling and no edema.  Neurological: She is alert and oriented to person, place, and time.  Skin: Skin is warm and dry. She is  not diaphoretic.  Psychiatric: Mood, memory, affect and judgment normal.  Nursing note and vitals reviewed.      Assessment & Plan:

## 2016-01-13 NOTE — Progress Notes (Signed)
Pre visit review using our clinic review tool, if applicable. No additional management support is needed unless otherwise documented below in the visit note. 

## 2016-01-13 NOTE — Assessment & Plan Note (Signed)
Well controlled with occasional as needed klonipin. No changes made.

## 2016-01-13 NOTE — Patient Instructions (Signed)
Great to see you.  Please use flexeril as we discussed.  Keep an eye on your blood pressure and call me.

## 2016-01-15 ENCOUNTER — Ambulatory Visit: Payer: BLUE CROSS/BLUE SHIELD | Admitting: Family Medicine

## 2016-01-26 ENCOUNTER — Ambulatory Visit (INDEPENDENT_AMBULATORY_CARE_PROVIDER_SITE_OTHER): Payer: BLUE CROSS/BLUE SHIELD | Admitting: Family Medicine

## 2016-01-26 ENCOUNTER — Encounter: Payer: Self-pay | Admitting: Family Medicine

## 2016-01-26 VITALS — BP 192/92 | HR 79 | Temp 98.1°F | Wt 147.5 lb

## 2016-01-26 DIAGNOSIS — Z23 Encounter for immunization: Secondary | ICD-10-CM | POA: Diagnosis not present

## 2016-01-26 DIAGNOSIS — R300 Dysuria: Secondary | ICD-10-CM

## 2016-01-26 LAB — POC URINALSYSI DIPSTICK (AUTOMATED)
Bilirubin, UA: NEGATIVE
Blood, UA: NEGATIVE
Glucose, UA: NEGATIVE
Ketones, UA: NEGATIVE
Nitrite, UA: NEGATIVE
Protein, UA: NEGATIVE
Spec Grav, UA: >=1.03
Urobilinogen, UA: 0.2
pH, UA: 6

## 2016-01-26 MED ORDER — CIPROFLOXACIN HCL 500 MG PO TABS: 500.0000 mg | ORAL_TABLET | Freq: Two times a day (BID) | ORAL | 0 refills | Status: DC

## 2016-01-26 MED ORDER — PHENAZOPYRIDINE HCL 100 MG PO TABS: 100.0000 mg | ORAL_TABLET | Freq: Three times a day (TID) | ORAL | 0 refills | Status: DC | PRN

## 2016-01-26 NOTE — Progress Notes (Signed)
Pre visit review using our clinic review tool, if applicable. No additional management support is needed unless otherwise documented below in the visit note. 

## 2016-01-26 NOTE — Progress Notes (Signed)
SUBJECTIVE: Kelsey Simon is a 60 y.o. female who complains of urinary frequency, urgency and dysuria x 1 day, without flank pain, fever, chills, or abnormal vaginal discharge or bleeding.   Current Outpatient Prescriptions on File Prior to Visit  Medication Sig Dispense Refill  . Ascorbic Acid (VITAMIN C) 1000 MG tablet Take 1,000 mg by mouth daily.    Marland Kitchen b complex vitamins tablet Take 1 tablet by mouth daily.    . Calcium Carbonate-Vit D-Min (CALCIUM 600 + MINERALS PO) Take by mouth.    . Cholecalciferol (D3-1000 PO) Take one by mouth daily    . clonazePAM (KLONOPIN) 0.5 MG tablet TAKE ONE-HALF TABLET BY MOUTH AS NEEDED FOR SLEEP 30 tablet 1  . cyclobenzaprine (FLEXERIL) 5 MG tablet Take 1 tablet (5 mg total) by mouth 3 (three) times daily as needed for muscle spasms. 20 tablet 1  . dicyclomine (BENTYL) 20 MG tablet TAKE ONE TABLET EVERY 6 HOURS AS NEEDED 30 tablet 0  . magnesium gluconate (MAGONATE) 500 MG tablet Take 500 mg by mouth daily.    . Milk Thistle 250 MG CAPS Take one by mouth daily    . pyridOXINE (VITAMIN B-6) 100 MG tablet Take 100 mg by mouth. Take by mouth 3 times a week    . triamterene-hydrochlorothiazide (MAXZIDE) 75-50 MG tablet Take 1 tablet by mouth daily. 90 tablet 3   No current facility-administered medications on file prior to visit.     Allergies  Allergen Reactions  . Penicillins     REACTION: rash    Past Medical History:  Diagnosis Date  . Hypertension   . Varicose veins     Past Surgical History:  Procedure Laterality Date  . NASAL SEPTUM SURGERY  1990    Family History  Problem Relation Age of Onset  . Hypertension Mother   . Cancer Father   . Drug abuse Daughter     Social History   Social History  . Marital status: Married    Spouse name: N/A  . Number of children: N/A  . Years of education: N/A   Occupational History  . Not on file.   Social History Main Topics  . Smoking status: Former Research scientist (life sciences)  . Smokeless tobacco: Never  Used  . Alcohol use Yes     Comment: wine daily  . Drug use: No  . Sexual activity: Not on file   Other Topics Concern  . Not on file   Social History Narrative  . No narrative on file   The PMH, PSH, Social History, Family History, Medications, and allergies have been reviewed in [facility], and have been updated if relevant.  OBJECTIVE: BP (!) 192/92   Pulse 79   Temp 98.1 F (36.7 C) (Oral)   Wt 147 lb 8 oz (66.9 kg)   SpO2 100%   BMI 23.81 kg/m   Appears well, in no apparent distress.  Vital signs are normal. The abdomen is soft without tenderness, guarding, mass, rebound or organomegaly. No CVA tenderness or inguinal adenopathy noted. Urine dipstick shows positive for WBC's.    ASSESSMENT: UTI uncomplicated without evidence of pyelonephritis  PLAN: Treatment per orders - cipro 500 mg twice daily x 3 days, also push fluids, may use Pyridium OTC prn. Call or return to clinic prn if these symptoms worsen or fail to improve as anticipated.

## 2016-01-26 NOTE — Patient Instructions (Signed)

## 2016-01-28 LAB — URINE CULTURE: Colony Count: >=100000

## 2016-01-30 ENCOUNTER — Telehealth: Payer: Self-pay | Admitting: Family Medicine

## 2016-01-30 ENCOUNTER — Other Ambulatory Visit: Payer: Self-pay | Admitting: Family Medicine

## 2016-01-30 MED ORDER — CIPROFLOXACIN HCL 500 MG PO TABS: 500.0000 mg | ORAL_TABLET | Freq: Two times a day (BID) | ORAL | 0 refills | Status: DC

## 2016-01-30 NOTE — Telephone Encounter (Signed)
Received call from [facility] reporting persistent symptoms. Culture sensitive to cipro. Extending course of treatment (additional 4 days to complete a 7 day course). Azo PRN pain. Rx for cipro sent.

## 2016-02-03 ENCOUNTER — Telehealth: Payer: Self-pay

## 2016-02-03 NOTE — Telephone Encounter (Signed)
PLEASE NOTE: All timestamps contained within this report are represented as [place] Standard Time. CONFIDENTIALTY NOTICE: This fax transmission is intended only for the addressee. It contains information that is legally privileged, confidential or otherwise protected from use or disclosure. If you are not the intended recipient, you are strictly prohibited from reviewing, disclosing, copying using or disseminating any of this information or taking any action in reliance on or regarding this information. If you have received this fax in error, please notify us immediately by telephone so that we can arrange for its return to [place]. Phone: [phone], Toll-Free: [phone], Fax: [phone] Page: 1 of 2 Call Id: [id number] [place] Patient Name: Kelsey Simon Gender: Female DOB: Nov 19, 1955 Age: 60 Y 10 M 14 D Return Phone Number: [phone] (Primary) Address: City/State/Zip: [place] Client [place] Night - Client Client Site [place] Physician Arnette Norris - MD Contact Type Call Who Is Calling Patient / Member / Family / Caregiver Call Type Triage / Clinical Relationship To Patient Self Return Phone Number [phone] (Primary) Chief Complaint Urination Frequency Reason for Call Symptomatic / Request for Health Information Initial Comment Caller states Dr prescribed Cipro for bad uti. Finished 3 day course and still having sx. Sx urination frequency and pressure PreDisposition Go to Urgent Care/Walk-In Clinic Translation No Nurse Assessment Nurse: Laurena Bering, RN, Helene Kelp Date/Time Eilene Ghazi Time): 01/30/2016 6:32:25 PM Confirm and document reason for call. If symptomatic, describe symptoms. You must click the next button to save text entered. ---Caller states that she finished 3 day course of Cipro. Burning is better but still having  frequency and urgency. Denies fever Has the patient traveled out of the country within the last 30 days? ---No Does the patient have any new or worsening symptoms? ---Yes Will a triage be completed? ---Yes Related visit to physician within the last 2 weeks? ---Yes Does the PT have any chronic conditions? (i.e. diabetes, asthma, etc.) ---No Is this a behavioral health or substance abuse call? ---No Guidelines Guideline Title Affirmed Question Affirmed Notes Nurse Date/Time Eilene Ghazi Time) Urinary Tract Infection on Antibiotic Follow-up Call - Female [1] Taking antibiotic > 72 hours (3 days) for UTI AND [2] painful urination or frequency not improved Milton Ferguson 01/30/2016 6:35:35 PM Disp. Time Eilene Ghazi Time) Disposition Final User 01/30/2016 6:42:38 PM Paged On Call back to [facility], [place], Helene Kelp 01/30/2016 6:43:05 PM Paged On Call back to Call Center Laurena Bering, RN, Helene Kelp Reason: MD paged PLEASE NOTE: All timestamps contained within this report are represented as [place] Standard Time. CONFIDENTIALTY NOTICE: This fax transmission is intended only for the addressee. It contains information that is legally privileged, confidential or otherwise protected from use or disclosure. If you are not the intended recipient, you are strictly prohibited from reviewing, disclosing, copying using or disseminating any of this information or taking any action in reliance on or regarding this information. If you have received this fax in error, please notify us immediately by telephone so that we can arrange for its return to [place]. Phone: [phone], Toll-Free: [phone], Fax: [phone] Page: 2 of 2 Call Id: [id number] [place]. Time Eilene Ghazi Time) Disposition Final User 01/30/2016 8:42:31 PM Call Completed Milton Ferguson 01/30/2016 6:38:49 PM See Physician within 24 Hours Yes Laurena Bering, RN, [place] Artist Understands: Yes Disagree/Comply: Comply Care Advice Given Per Guideline SEE PHYSICIAN WITHIN  24 HOURS: FLUIDS: Drink extra fluids. Drink 8-10 glasses of  liquids a day. (Reason: to produce a dilute, non-irritating urine.) CAUTION - FLUIDS: Increased fluid intake may be contraindicated in adults with renal failure or heart failure. Discuss with PCP. PAIN MEDICINES: * For pain relief, take acetaminophen, ibuprofen, or naproxen. CAUTION - NSAIDS (E.G., IBUPROFEN, NAPROXEN): * You may take this medicine with or without food. Taking it with food or milk may lessen the chance the drug will upset your stomach. * GASTROINTESTINAL RISK: There is an increased risk of stomach ulcers, GI bleeding, perforation. CALL BACK IF: * You become worse. CARE ADVICE given per Urinary Tract Infection on Antibiotic Follow-Up Call, Female (Adult) guideline. Comments User: Dorothyann Peng, RN Date/Time Eilene Ghazi Time): 01/30/2016 6:58:54 PM Notified caller that MD called in 4 more days of Cipro. Advised to take Azo fro pain. Verbalized understanding Referrals REFERRED TO PCP OFFICE REFERRED TO PCP OFFICE Paging DoctorName Phone DateTime Result/Outcome Message Type Notes Thersa Salt - DO [phone] 01/30/2016 6:42:38 PM Paged On Call Back to Call Center Doctor Paged [place], [place] 01/30/2016 6:56:57 PM Spoke with On Call - General Message Result Gave report to MD. MD called in Cipro for 4 more days. Advies to take Azo for pain

## 2016-02-06 ENCOUNTER — Encounter (HOSPITAL_BASED_OUTPATIENT_CLINIC_OR_DEPARTMENT_OTHER): Payer: Self-pay | Admitting: *Deleted

## 2016-02-06 NOTE — Progress Notes (Addendum)
Coming Wednesday for BMET, EKG and to pick up Boost drink. Bring all medications.

## 2016-02-09 ENCOUNTER — Other Ambulatory Visit: Payer: Self-pay

## 2016-02-09 ENCOUNTER — Telehealth: Payer: Self-pay | Admitting: Family Medicine

## 2016-02-09 ENCOUNTER — Encounter (HOSPITAL_BASED_OUTPATIENT_CLINIC_OR_DEPARTMENT_OTHER)
Admission: RE | Admit: 2016-02-09 | Discharge: 2016-02-09 | Disposition: A | Discharge status: Home / Self Care | Payer: BLUE CROSS/BLUE SHIELD | Source: Ambulatory Visit | Attending: General Surgery | Admitting: General Surgery

## 2016-02-09 DIAGNOSIS — Z01818 Encounter for other preprocedural examination: Secondary | ICD-10-CM

## 2016-02-09 DIAGNOSIS — Z01810 Encounter for preprocedural cardiovascular examination: Secondary | ICD-10-CM | POA: Diagnosis not present

## 2016-02-09 DIAGNOSIS — I517 Cardiomegaly: Secondary | ICD-10-CM | POA: Diagnosis not present

## 2016-02-09 DIAGNOSIS — R9431 Abnormal electrocardiogram [ECG] [EKG]: Secondary | ICD-10-CM

## 2016-02-09 LAB — BASIC METABOLIC PANEL
Anion gap: 10 (ref 5–15)
BUN: 12 mg/dL (ref 6–20)
CO2: 29 mmol/L (ref 22–32)
Calcium: 9.3 mg/dL (ref 8.9–10.3)
Chloride: 99 mmol/L — ABNORMAL LOW (ref 101–111)
Creatinine, Ser: 0.74 mg/dL (ref 0.44–1.00)
GFR calc Af Amer: >60 mL/min (ref >60)
GFR calc non Af Amer: >60 mL/min (ref >60)
Glucose, Bld: 124 mg/dL — ABNORMAL HIGH (ref 65–99)
Potassium: 3.8 mmol/L (ref 3.5–5.1)
Sodium: 138 mmol/L (ref 135–145)

## 2016-02-09 NOTE — Telephone Encounter (Signed)
Leretha Dykes, PreAdmin nurse for [facility] Day surgery called with information on patient's most recent EKG.  Pt is having PVC's and has never been seen by cardiology.  Anaesthesilogist would like to have primary see pt for clearance.  Spoke to PCP, she will place urgent cardiology referral due to nature of upcoming surgery.    Jenny Reichmann would like a surgical clearance letter placed in chart as well as a callback, (page her please) at [phone] when completed.

## 2016-02-09 NOTE — Telephone Encounter (Signed)
Thank you :)

## 2016-02-09 NOTE — Telephone Encounter (Signed)
Appt made with Dr Yvone Neu for 02/11/16. Patient will call [facility] Pre Admit to let them know when and who she will see for clearance.

## 2016-02-09 NOTE — Telephone Encounter (Signed)
Referral placed.

## 2016-02-09 NOTE — Progress Notes (Signed)
Pt given 8 oz carton of boost breeze with verbal and written instructions  To drink at 0430 morning day of surgery.  Teach back and pt voiced understanding.   EKG reviewed by Dr Maryland Pink, due to PVC's he wants pt seen by private md for clearance.  Called dr Deborra Medina office and spoke with Vaughan Basta,  Who in turn spoke to DR Deborra Medina who will refer to cardiology. Will try to get pt seen by cardiology and cleared by [facility] prior to seed placement.  Pt understands. Office will call her with appointment and then office will call here to confirm clearance.  Dr Marlou Starks notified of what is occurring,

## 2016-02-11 ENCOUNTER — Other Ambulatory Visit: Payer: Self-pay

## 2016-02-11 ENCOUNTER — Encounter: Payer: Self-pay | Admitting: Cardiology

## 2016-02-11 ENCOUNTER — Ambulatory Visit (INDEPENDENT_AMBULATORY_CARE_PROVIDER_SITE_OTHER): Payer: BLUE CROSS/BLUE SHIELD | Admitting: Cardiology

## 2016-02-11 ENCOUNTER — Ambulatory Visit (INDEPENDENT_AMBULATORY_CARE_PROVIDER_SITE_OTHER): Payer: BLUE CROSS/BLUE SHIELD

## 2016-02-11 VITALS — BP 128/68 | HR 81 | Ht 67.5 in | Wt 145.8 lb

## 2016-02-11 DIAGNOSIS — Z01818 Encounter for other preprocedural examination: Secondary | ICD-10-CM

## 2016-02-11 DIAGNOSIS — R9431 Abnormal electrocardiogram [ECG] [EKG]: Secondary | ICD-10-CM

## 2016-02-11 DIAGNOSIS — I493 Ventricular premature depolarization: Secondary | ICD-10-CM | POA: Diagnosis not present

## 2016-02-11 LAB — ECHOCARDIOGRAM COMPLETE
Height: 67.5 in
Weight: 2332 oz

## 2016-02-11 NOTE — Addendum Note (Signed)
Addended by: Valora Corporal on: 02/11/2016 10:08 AM   Modules accepted: Orders

## 2016-02-11 NOTE — Progress Notes (Signed)
Cardiology Office Note   Date:  02/11/2016   ID:  Tamicha, Towsley August 25, 1955, MRN [id number]  Referring Doctor:  Arnette Norris, MD   Cardiologist:   Wende Bushy, MD   Reason for consultation:  Chief Complaint  Patient presents with  . other    Cardiac clearance breast biopsy. Meds reviewed verbally with pt.      History of Present Illness: Kelsey Simon is a 60 y.o. female who presents for Preoperative evaluation  Patient had an EKG and 11 2017 that showed 3 PVCs. Here for cardiac clearance.  Patient is physically active. She gets too high to 12 miles every week. She gets to do at least 2 days of hiking each week. She has no trouble breathing with exertion. No chest pains or no chest tightness.  No palpitations. During the time that she had the EKG done, she felt the extra beats. She believes it was from anxiety. She has not had any issues with persistent palpitations.  No loss of consciousness no PND and no orthopnea and no edema no abdominal pain  ROS:  Please see the history of present illness. Aside from mentioned under HPI, all other systems are reviewed and negative.     Past Medical History:  Diagnosis Date  . Hypertension   . PONV (postoperative nausea and vomiting)   . Varicose veins     Past Surgical History:  Procedure Laterality Date  . NASAL SEPTUM SURGERY  1990  . WISDOM TOOTH EXTRACTION       reports that she has quit smoking. She has never used smokeless tobacco. She reports that she drinks alcohol. She reports that she does not use drugs.   family history includes Cancer in her father; Drug abuse in her daughter; Heart attack in her father; Hypertension in her mother. Heart attack in father at age 50. Unclear if truly coronary artery disease. Did not receive any stenting or bypass surgery. It was in the setting of severe emotional distress. His grandfather committed suicide at that time.  Outpatient Medications Prior to Visit  Medication  Sig Dispense Refill  . Ascorbic Acid (VITAMIN C) 1000 MG tablet Take 1,000 mg by mouth daily.    Marland Kitchen b complex vitamins tablet Take 1 tablet by mouth daily.    . Calcium Carbonate-Vit D-Min (CALCIUM 600 + MINERALS PO) Take by mouth.    . Cholecalciferol (D3-1000 PO) Take one by mouth daily    . clonazePAM (KLONOPIN) 0.5 MG tablet TAKE ONE-HALF TABLET BY MOUTH AS NEEDED FOR SLEEP 30 tablet 1  . cyclobenzaprine (FLEXERIL) 5 MG tablet Take 1 tablet (5 mg total) by mouth 3 (three) times daily as needed for muscle spasms. 20 tablet 1  . dicyclomine (BENTYL) 20 MG tablet TAKE ONE TABLET EVERY 6 HOURS AS NEEDED 30 tablet 0  . magnesium gluconate (MAGONATE) 500 MG tablet Take 500 mg by mouth daily.    Marland Kitchen pyridOXINE (VITAMIN B-6) 100 MG tablet Take 100 mg by mouth. Take by mouth 3 times a week    . triamterene-hydrochlorothiazide (MAXZIDE) 75-50 MG tablet Take 1 tablet by mouth daily. 90 tablet 3   No facility-administered medications prior to visit.      Allergies: Penicillins    PHYSICAL EXAM: VS:  BP 128/68 (BP Location: Right Arm, Patient Position: Sitting, Cuff Size: Normal)   Pulse 81   Ht 5' 7.5" (1.715 m)   Wt 145 lb 12 oz (66.1 kg)   BMI 22.49 kg/m  ,  Body mass index is 22.49 kg/m. Wt Readings from Last 3 Encounters:  02/11/16 145 lb 12 oz (66.1 kg)  01/26/16 147 lb 8 oz (66.9 kg)  01/13/16 147 lb 8 oz (66.9 kg)    GENERAL:  well developed, well nourished, not in acute distress HEENT: normocephalic, pink conjunctivae, anicteric sclerae, no xanthelasma, normal dentition, oropharynx clear NECK:  no neck vein engorgement, JVP normal, no hepatojugular reflux, carotid upstroke brisk and symmetric, no bruit, no thyromegaly, no lymphadenopathy LUNGS:  good respiratory effort, clear to auscultation bilaterally CV:  PMI not displaced, no thrills, no lifts, S1 and S2 within normal limits, no palpable S3 or S4, no murmurs, no rubs, no gallops ABD:  Soft, nontender, nondistended, normoactive  bowel sounds, no abdominal aortic bruit, no hepatomegaly, no splenomegaly MS: nontender back, no kyphosis, no scoliosis, no joint deformities EXT:  2+ DP/PT pulses, no edema, no varicosities, no cyanosis, no clubbing SKIN: warm, nondiaphoretic, normal turgor, no ulcers NEUROPSYCH: alert, oriented to person, place, and time, sensory/motor grossly intact, normal mood, appropriate affect  Recent Labs: 03/27/2015: ALT 25 02/09/2016: BUN 12; Creatinine, Ser 0.74; Potassium 3.8; Sodium 138   Lipid Panel    Component Value Date/Time   CHOL 181 07/27/2013 1552   TRIG 116 07/27/2013 1552   HDL 77 07/27/2013 1552   CHOLHDL 2.4 07/27/2013 1552   VLDL 23 07/27/2013 1552   LDLCALC 81 07/27/2013 1552   LDLDIRECT 123.1 01/04/2012 1135     Other studies Reviewed:  EKG:  The ekg from09/03/2016 was personally reviewed by me and it revealed sinus rhythm 85 bpm, frequent PVCs. Biatrial enlargement. Nonspecific ST-T wave changes.  EKG from 02/11/2016 was personally reviewed by me and it revealed sinus rhythm, 81 BPM. Right atrial enlargement. Borderline EKG.  Additional studies/ records that were reviewed personally reviewed by me today include: None available   ASSESSMENT AND PLAN:  PVCs on EKG from 02/09/2016 Question of right atrial enlargement on EKG/abnormal EKG Preoperative evaluation prior to breast biopsy/excision  There is no indication for stress testing. Patient has normal or excellent functional capacity. She is able to do at least 2 days of hiking every week. She is able to hike 12 miles at a time without chest pains or shortness of breath. She has no other symptoms of chest pain or shortness of breath otherwise.  She does not have palpitations. Recommend echocardiogram to check LV EF. If ejection fraction is normal and echo was otherwise unremarkable, her cardiac risk first said procedure is low cardiac risk.  Hypertension BP is well controlled. Continue monitoring BP. Continue  current medical therapy and lifestyle changes.  Current medicines are reviewed at length with the patient today.  The patient does not have concerns regarding medicines.  Labs/ tests ordered today include:  Orders Placed This Encounter  Procedures  . EKG 12-Lead    I had a lengthy and detailed discussion with the patient regarding diagnoses, prognosis, diagnostic options, treatment options .  I counseled the patient on importance of lifestyle modification including heart healthy diet, regular physical activity .Marland Kitchen   Disposition:   FU with undersigned after tests prn  Signed, Wende Bushy, MD  02/11/2016 10:00 AM    [place]  This note was generated in part with voice recognition software and I apologize for any typographical errors that were not detected and corrected.

## 2016-02-11 NOTE — Patient Instructions (Addendum)
Testing/Procedures: Your physician has requested that you have an echocardiogram. Echocardiography is a painless test that uses sound waves to create images of your heart. It provides your doctor with information about the size and shape of your heart and how well your heart's chambers and valves are working. This procedure takes approximately one hour. There are no restrictions for this procedure.  Follow-Up: Your physician recommends that you schedule a follow-up appointment as needed. We will call you with results and if needed schedule follow up at that time.  It was a pleasure seeing you today here in the office. Please do not hesitate to give us a call back if you have any further questions. [phone]  Pamela A. RN, BSN     Echocardiogram An echocardiogram, or echocardiography, uses sound waves (ultrasound) to produce an image of your heart. The echocardiogram is simple, painless, obtained within a short period of time, and offers valuable information to your health care provider. The images from an echocardiogram can provide information such as:  Evidence of coronary artery disease (CAD).  Heart size.  Heart muscle function.  Heart valve function.  Aneurysm detection.  Evidence of a past heart attack.  Fluid buildup around the heart.  Heart muscle thickening.  Assess heart valve function. LET YOUR HEALTH CARE PROVIDER KNOW ABOUT:  Any allergies you have.  All medicines you are taking, including vitamins, herbs, eye drops, creams, and over-the-counter medicines.  Previous problems you or members of your family have had with the use of anesthetics.  Any blood disorders you have.  Previous surgeries you have had.  Medical conditions you have.  Possibility of pregnancy, if this applies. BEFORE THE PROCEDURE  No special preparation is needed. Eat and drink normally.  PROCEDURE   In order to produce an image of your heart, gel will be applied to your chest and  a wand-like tool (transducer) will be moved over your chest. The gel will help transmit the sound waves from the transducer. The sound waves will harmlessly bounce off your heart to allow the heart images to be captured in real-time motion. These images will then be recorded.  You may need an IV to receive a medicine that improves the quality of the pictures. AFTER THE PROCEDURE You may return to your normal schedule including diet, activities, and medicines, unless your health care provider tells you otherwise.   This information is not intended to replace advice given to you by your health care provider. Make sure you discuss any questions you have with your health care provider.   Document Released: 05/14/2000 Document Revised: 06/07/2014 Document Reviewed: 01/22/2013 Elsevier Interactive Patient Education 2016 [employer]vier Inc[employer]  

## 2016-02-12 ENCOUNTER — Ambulatory Visit
Admission: RE | Admit: 2016-02-12 | Discharge: 2016-02-12 | Disposition: A | Discharge status: Home / Self Care | Payer: BLUE CROSS/BLUE SHIELD | Source: Ambulatory Visit | Attending: General Surgery | Admitting: General Surgery

## 2016-02-12 DIAGNOSIS — N6489 Other specified disorders of breast: Secondary | ICD-10-CM | POA: Diagnosis not present

## 2016-02-12 DIAGNOSIS — R921 Mammographic calcification found on diagnostic imaging of breast: Secondary | ICD-10-CM

## 2016-02-12 NOTE — Anesthesia Preprocedure Evaluation (Addendum)
Anesthesia Evaluation  Patient identified by MRN, date of birth, ID band Patient awake    Reviewed: Allergy & Precautions, NPO status , Patient's Chart, lab work & pertinent test results  History of Anesthesia Complications (+) PONV and history of anesthetic complications  Airway Mallampati: II  TM Distance: >3 FB Neck ROM: Full    Dental  (+) Teeth Intact, Dental Advisory Given   Pulmonary neg shortness of breath, neg sleep apnea, neg COPD, neg recent URI, former smoker,    Pulmonary exam normal breath sounds clear to auscultation       Cardiovascular hypertension, Pt. on medications (-) angina(-) Past MI and (-) Cardiac Stents + dysrhythmias (PVCs)  Rhythm:Regular Rate:Normal  TTE 02/11/2016: Study Conclusions  - Left ventricle: The cavity size was normal. Wall thickness was   normal. Systolic function was normal. The estimated ejection   fraction was in the range of 55% to 60%. Wall motion was normal;   there were no regional wall motion abnormalities. Left   ventricular diastolic function parameters were normal for the   patient&'s age.   Neuro/Psych neg Seizures PSYCHIATRIC DISORDERS Anxiety negative neurological ROS     GI/Hepatic negative GI ROS, Neg liver ROS,   Endo/Other  negative endocrine ROS  Renal/GU negative Renal ROS  Female GU complaint (overactive bladder)     Musculoskeletal Thoracic back pain   Abdominal   Peds  Hematology negative hematology ROS (+)   Anesthesia Other Findings   Reproductive/Obstetrics                            Anesthesia Physical Anesthesia Plan  ASA: II  Anesthesia Plan: General   Post-op Pain Management:    Induction: Intravenous  Airway Management Planned: LMA  Additional Equipment:   Intra-op Plan:   Post-operative Plan: Extubation in OR  Informed Consent: I have reviewed the patients History and Physical, chart, labs and  discussed the procedure including the risks, benefits and alternatives for the proposed anesthesia with the patient or authorized representative who has indicated his/her understanding and acceptance.   Dental advisory given  Plan Discussed with: CRNA  Anesthesia Plan Comments: (Risks of general anesthesia discussed including, but not limited to, sore throat, hoarse voice, chipped/damaged teeth, injury to vocal cords, nausea and vomiting, allergic reactions, lung infection, heart attack, stroke, and death. All questions answered. )       Anesthesia Quick Evaluation

## 2016-02-13 ENCOUNTER — Ambulatory Visit (HOSPITAL_BASED_OUTPATIENT_CLINIC_OR_DEPARTMENT_OTHER): Payer: BLUE CROSS/BLUE SHIELD | Admitting: Anesthesiology

## 2016-02-13 ENCOUNTER — Ambulatory Visit (HOSPITAL_BASED_OUTPATIENT_CLINIC_OR_DEPARTMENT_OTHER)
Admission: RE | Admit: 2016-02-13 | Discharge: 2016-02-13 | Disposition: A | Discharge status: Home / Self Care | Payer: BLUE CROSS/BLUE SHIELD | Source: Ambulatory Visit | Attending: General Surgery | Admitting: General Surgery

## 2016-02-13 ENCOUNTER — Encounter (HOSPITAL_BASED_OUTPATIENT_CLINIC_OR_DEPARTMENT_OTHER)
Admission: RE | Disposition: A | Discharge status: Home / Self Care | Payer: Self-pay | Source: Ambulatory Visit | Attending: General Surgery

## 2016-02-13 ENCOUNTER — Ambulatory Visit
Admission: RE | Admit: 2016-02-13 | Discharge: 2016-02-13 | Disposition: A | Discharge status: Home / Self Care | Payer: BLUE CROSS/BLUE SHIELD | Source: Ambulatory Visit | Attending: General Surgery | Admitting: General Surgery

## 2016-02-13 ENCOUNTER — Encounter (HOSPITAL_BASED_OUTPATIENT_CLINIC_OR_DEPARTMENT_OTHER): Payer: Self-pay | Admitting: Anesthesiology

## 2016-02-13 DIAGNOSIS — I10 Essential (primary) hypertension: Secondary | ICD-10-CM | POA: Insufficient documentation

## 2016-02-13 DIAGNOSIS — R921 Mammographic calcification found on diagnostic imaging of breast: Secondary | ICD-10-CM | POA: Insufficient documentation

## 2016-02-13 DIAGNOSIS — Z79899 Other long term (current) drug therapy: Secondary | ICD-10-CM | POA: Insufficient documentation

## 2016-02-13 DIAGNOSIS — N6021 Fibroadenosis of right breast: Secondary | ICD-10-CM | POA: Diagnosis not present

## 2016-02-13 DIAGNOSIS — R0602 Shortness of breath: Secondary | ICD-10-CM | POA: Diagnosis not present

## 2016-02-13 DIAGNOSIS — Z87891 Personal history of nicotine dependence: Secondary | ICD-10-CM | POA: Insufficient documentation

## 2016-02-13 DIAGNOSIS — N6011 Diffuse cystic mastopathy of right breast: Secondary | ICD-10-CM | POA: Diagnosis not present

## 2016-02-13 DIAGNOSIS — J449 Chronic obstructive pulmonary disease, unspecified: Secondary | ICD-10-CM | POA: Diagnosis not present

## 2016-02-13 DIAGNOSIS — N6489 Other specified disorders of breast: Secondary | ICD-10-CM | POA: Diagnosis not present

## 2016-02-13 HISTORY — DX: Other specified postprocedural states: Z98.890

## 2016-02-13 HISTORY — PX: BREAST LUMPECTOMY WITH RADIOACTIVE SEED LOCALIZATION: SHX6424

## 2016-02-13 HISTORY — DX: Nausea with vomiting, unspecified: R11.2

## 2016-02-13 SURGERY — BREAST LUMPECTOMY WITH RADIOACTIVE SEED LOCALIZATION
Anesthesia: General | Site: Breast | Laterality: Right

## 2016-02-13 MED ORDER — DEXAMETHASONE SODIUM PHOSPHATE 4 MG/ML IJ SOLN
INTRAMUSCULAR | Status: DC | PRN
  Administered 2016-02-13: 10 mg via INTRAVENOUS

## 2016-02-13 MED ORDER — FENTANYL CITRATE (PF) 100 MCG/2ML IJ SOLN
INTRAMUSCULAR | Status: AC
  Filled 2016-02-13: qty 2

## 2016-02-13 MED ORDER — LACTATED RINGERS IV SOLN
INTRAVENOUS | Status: DC
  Administered 2016-02-13: 07:00:00 via INTRAVENOUS

## 2016-02-13 MED ORDER — PROMETHAZINE HCL 25 MG/ML IJ SOLN: 6.2500 mg | INTRAMUSCULAR | Status: DC | PRN

## 2016-02-13 MED ORDER — PROPOFOL 500 MG/50ML IV EMUL
INTRAVENOUS | Status: AC
  Filled 2016-02-13: qty 50

## 2016-02-13 MED ORDER — SCOPOLAMINE 1 MG/3DAYS TD PT72: 1.0000 | MEDICATED_PATCH | TRANSDERMAL | Status: DC

## 2016-02-13 MED ORDER — BUPIVACAINE HCL (PF) 0.25 % IJ SOLN
INTRAMUSCULAR | Status: AC
  Filled 2016-02-13: qty 30

## 2016-02-13 MED ORDER — LIDOCAINE 2% (20 MG/ML) 5 ML SYRINGE
INTRAMUSCULAR | Status: AC
  Filled 2016-02-13: qty 5

## 2016-02-13 MED ORDER — ONDANSETRON HCL 4 MG/2ML IJ SOLN
INTRAMUSCULAR | Status: AC
  Filled 2016-02-13: qty 2

## 2016-02-13 MED ORDER — BUPIVACAINE-EPINEPHRINE (PF) 0.25% -1:200000 IJ SOLN
INTRAMUSCULAR | Status: DC | PRN
Start: 2016-02-13 — End: 2016-02-13
  Administered 2016-02-13: 20 mL

## 2016-02-13 MED ORDER — ONDANSETRON HCL 4 MG/2ML IJ SOLN
INTRAMUSCULAR | Status: DC | PRN
Start: 2016-02-13 — End: 2016-02-13
  Administered 2016-02-13: 4 mg via INTRAVENOUS

## 2016-02-13 MED ORDER — FENTANYL CITRATE (PF) 100 MCG/2ML IJ SOLN
50.0000 ug | INTRAMUSCULAR | Status: AC | PRN
  Administered 2016-02-13: 25 ug via INTRAVENOUS
  Administered 2016-02-13: 100 ug via INTRAVENOUS
  Administered 2016-02-13: 25 ug via INTRAVENOUS

## 2016-02-13 MED ORDER — VANCOMYCIN HCL IN DEXTROSE 1-5 GM/200ML-% IV SOLN
INTRAVENOUS | Status: AC
  Filled 2016-02-13: qty 200

## 2016-02-13 MED ORDER — MIDAZOLAM HCL 2 MG/2ML IJ SOLN
INTRAMUSCULAR | Status: AC
  Filled 2016-02-13: qty 2

## 2016-02-13 MED ORDER — GLYCOPYRROLATE 0.2 MG/ML IJ SOLN: 0.2000 mg | Freq: Once | INTRAMUSCULAR | Status: DC | PRN

## 2016-02-13 MED ORDER — FENTANYL CITRATE (PF) 100 MCG/2ML IJ SOLN
25.0000 ug | INTRAMUSCULAR | Status: DC | PRN
  Administered 2016-02-13: 25 ug via INTRAVENOUS

## 2016-02-13 MED ORDER — VANCOMYCIN HCL 1000 MG IV SOLR
INTRAVENOUS | Status: DC | PRN
  Administered 2016-02-13: 1000 mg via INTRAVENOUS

## 2016-02-13 MED ORDER — PROPOFOL 10 MG/ML IV BOLUS
INTRAVENOUS | Status: DC | PRN
  Administered 2016-02-13: 150 mg via INTRAVENOUS

## 2016-02-13 MED ORDER — HYDROCODONE-ACETAMINOPHEN 5-325 MG PO TABS: 1.0000 | ORAL_TABLET | ORAL | 0 refills | Status: DC | PRN

## 2016-02-13 MED ORDER — LIDOCAINE 2% (20 MG/ML) 5 ML SYRINGE
INTRAMUSCULAR | Status: DC | PRN
  Administered 2016-02-13: 60 mg via INTRAVENOUS

## 2016-02-13 MED ORDER — SCOPOLAMINE 1 MG/3DAYS TD PT72
1.0000 | MEDICATED_PATCH | Freq: Once | TRANSDERMAL | Status: DC | PRN
  Administered 2016-02-13: 1.5 mg via TRANSDERMAL

## 2016-02-13 MED ORDER — SCOPOLAMINE 1 MG/3DAYS TD PT72
MEDICATED_PATCH | TRANSDERMAL | Status: AC
  Filled 2016-02-13: qty 1

## 2016-02-13 MED ORDER — DEXAMETHASONE SODIUM PHOSPHATE 10 MG/ML IJ SOLN
INTRAMUSCULAR | Status: AC
  Filled 2016-02-13: qty 1

## 2016-02-13 MED ORDER — MIDAZOLAM HCL 2 MG/2ML IJ SOLN
1.0000 mg | INTRAMUSCULAR | Status: DC | PRN
  Administered 2016-02-13: 2 mg via INTRAVENOUS

## 2016-02-13 SURGICAL SUPPLY — 40 items
APPLIER CLIP 9.375 MED OPEN (MISCELLANEOUS)
APR CLP MED 9.3 20 MLT OPN (MISCELLANEOUS)
BLADE SURG 15 STRL LF DISP TIS (BLADE) ×1 IMPLANT
BLADE SURG 15 STRL SS (BLADE) ×2
CANISTER SUC SOCK COL 7IN (MISCELLANEOUS) ×2 IMPLANT
CANISTER SUCT 1200ML W/VALVE (MISCELLANEOUS) ×2 IMPLANT
CHLORAPREP W/TINT 26ML (MISCELLANEOUS) ×2 IMPLANT
CLIP APPLIE 9.375 MED OPEN (MISCELLANEOUS) IMPLANT
COVER BACK TABLE 60X90IN (DRAPES) ×2 IMPLANT
COVER MAYO STAND STRL (DRAPES) ×2 IMPLANT
COVER PROBE W GEL 5X96 (DRAPES) ×2 IMPLANT
DECANTER SPIKE VIAL GLASS SM (MISCELLANEOUS) IMPLANT
DEVICE DUBIN W/COMP PLATE 8390 (MISCELLANEOUS) ×2 IMPLANT
DRAPE LAPAROSCOPIC ABDOMINAL (DRAPES) IMPLANT
DRAPE UTILITY XL STRL (DRAPES) ×2 IMPLANT
ELECT COATED BLADE 2.86 ST (ELECTRODE) ×2 IMPLANT
ELECT REM PT RETURN 9FT ADLT (ELECTROSURGICAL) ×2
ELECTRODE REM PT RTRN 9FT ADLT (ELECTROSURGICAL) ×1 IMPLANT
GLOVE BIO SURGEON STRL SZ7.5 (GLOVE) ×4 IMPLANT
GOWN STRL REUS W/ TWL LRG LVL3 (GOWN DISPOSABLE) ×2 IMPLANT
GOWN STRL REUS W/TWL LRG LVL3 (GOWN DISPOSABLE) ×4
ILLUMINATOR WAVEGUIDE N/F (MISCELLANEOUS) IMPLANT
KIT MARKER MARGIN INK (KITS) ×2 IMPLANT
LIGHT WAVEGUIDE WIDE FLAT (MISCELLANEOUS) IMPLANT
LIQUID BAND (GAUZE/BANDAGES/DRESSINGS) ×2 IMPLANT
NDL HYPO 25X1 1.5 SAFETY (NEEDLE) IMPLANT
NEEDLE HYPO 25X1 1.5 SAFETY (NEEDLE) IMPLANT
NS IRRIG 1000ML POUR BTL (IV SOLUTION) IMPLANT
PACK BASIN DAY SURGERY FS (CUSTOM PROCEDURE TRAY) ×2 IMPLANT
PENCIL BUTTON HOLSTER BLD 10FT (ELECTRODE) ×2 IMPLANT
SLEEVE SCD COMPRESS KNEE MED (MISCELLANEOUS) ×2 IMPLANT
SPONGE LAP 18X18 X RAY DECT (DISPOSABLE) ×2 IMPLANT
SUT MON AB 4-0 PC3 18 (SUTURE) IMPLANT
SUT SILK 2 0 SH (SUTURE) IMPLANT
SUT VICRYL 3-0 CR8 SH (SUTURE) ×2 IMPLANT
SYR CONTROL 10ML LL (SYRINGE) IMPLANT
TOWEL OR 17X24 6PK STRL BLUE (TOWEL DISPOSABLE) ×2 IMPLANT
TOWEL OR NON WOVEN STRL DISP B (DISPOSABLE) ×2 IMPLANT
TUBE CONNECTING 20X1/4 (TUBING) ×2 IMPLANT
YANKAUER SUCT BULB TIP NO VENT (SUCTIONS) IMPLANT

## 2016-02-13 NOTE — Anesthesia Postprocedure Evaluation (Signed)
Anesthesia Post Note  Patient: Kelsey Simon  Procedure(s) Performed: Procedure(s) (LRB): RIGHT BREAST LUMPECTOMY WITH RADIOACTIVE SEED LOCALIZATION (Right)  Patient location during evaluation: PACU Anesthesia Type: General Level of consciousness: awake and alert Pain management: pain level controlled Vital Signs Assessment: post-procedure vital signs reviewed and stable Respiratory status: spontaneous breathing, nonlabored ventilation and respiratory function stable Cardiovascular status: blood pressure returned to baseline and stable Postop Assessment: no signs of nausea or vomiting Anesthetic complications: no    Last Vitals:  Vitals:   02/13/16 0900 02/13/16 0915  BP: (!) 151/86   Pulse: 67 65  Resp: (!) 23 14  Temp:      Last Pain:  Vitals:   02/13/16 0900  TempSrc:   PainSc: 1                  Nilda Simmer

## 2016-02-13 NOTE — H&P (Signed)
Kelsey Simon. Kelsey Simon  Location: [facility] Patient #: [id number] DOB: 05/17/1956 Married / Language: English / Race: White Female   History of Present Illness  The patient is a 60 year old female who presents with a breast mass. We are asked to see the patient in consultation by Dr. Nolon Nations to evaluate her for right breast calcifications. The patient is a 60 year old white female who recently went for a routine screening mammogram. At that time she was found to have 2 clusters of calcification in the right breast that appeared abnormal. The central one was biopsied and found to be benign and concordant. The 2-1/2 cm area of calcification in the lower inner quadrant of the right breast was biopsied and found to be benign but felt to be discordant. It is this area of calcification that it has been recommended be removed. She denies any breast pain or discharge from the nipple. She has no personal or family history of breast cancer. She does not take any hormone replacement.   Other Problems  High blood pressure  Past Surgical History Oral Surgery  Diagnostic Studies History Colonoscopy 5-10 years ago Mammogram within last year Pap Smear 1-5 years ago  Allergies  PenicillAMINE *ASSORTED CLASSES*  Medication History  Triamterene-HCTZ (75-50MG  Tablet, Oral) Active. ClonazePAM (0.5MG  Tablet, Oral) Active. Vitamin C (100MG  Tablet, Oral) Active. Magnesium (100MG  Capsule, Oral) Active. Medications Reconciled  Social History  Alcohol use Moderate alcohol use. Caffeine use Coffee, Tea. No drug use Tobacco use Former smoker.  Family History  Cancer Father. Heart Disease Father. Heart disease in female family member before age 74 Hypertension Father. Prostate Cancer Father.  Pregnancy / Birth History  Age at menarche 1 years. Age of menopause 36-55 Gravida 1 Maternal age 46-25 Para 1    Review of Systems  General Not  Present- Appetite Loss, Chills, Fatigue, Fever, Night Sweats, Weight Gain and Weight Loss. Skin Not Present- Change in Wart/Mole, Dryness, Hives, Jaundice, New Lesions, Non-Healing Wounds, Rash and Ulcer. HEENT Not Present- Earache, Hearing Loss, Hoarseness, Nose Bleed, Oral Ulcers, Ringing in the Ears, Seasonal Allergies, Sinus Pain, Sore Throat, Visual Disturbances, Wears glasses/contact lenses and Yellow Eyes. Respiratory Not Present- Bloody sputum, Chronic Cough, Difficulty Breathing, Snoring and Wheezing. Breast Not Present- Breast Mass, Breast Pain, Nipple Discharge and Skin Changes. Cardiovascular Not Present- Chest Pain, Difficulty Breathing Lying Down, Leg Cramps, Palpitations, Rapid Heart Rate, Shortness of Breath and Swelling of Extremities. Gastrointestinal Not Present- Abdominal Pain, Bloating, Bloody Stool, Change in Bowel Habits, Chronic diarrhea, Constipation, Difficulty Swallowing, Excessive gas, Gets full quickly at meals, Hemorrhoids, Indigestion, Nausea, Rectal Pain and Vomiting. Female Genitourinary Not Present- Frequency, Nocturia, Painful Urination, Pelvic Pain and Urgency. Musculoskeletal Not Present- Back Pain, Joint Pain, Joint Stiffness, Muscle Pain, Muscle Weakness and Swelling of Extremities. Neurological Not Present- Decreased Memory, Fainting, Headaches, Numbness, Seizures, Tingling, Tremor, Trouble walking and Weakness. Psychiatric Not Present- Anxiety, Bipolar, Change in Sleep Pattern, Depression, Fearful and Frequent crying. Endocrine Not Present- Cold Intolerance, Excessive Hunger, Hair Changes, Heat Intolerance, Hot flashes and New Diabetes. Hematology Not Present- Easy Bruising, Excessive bleeding, Gland problems, HIV and Persistent Infections.  Vitals  Weight: 144 lb Height: 66in Body Surface Area: 1.74 m Body Mass Index: 23.24 kg/m  Temp.: 97.65F(Temporal)  Pulse: 81 (Regular)  BP: 134/84 (Sitting, Left Arm, Standard)       Physical Exam   General Mental Status-Alert. General Appearance-Consistent with stated age. Hydration-Well hydrated. Voice-Normal.  Head and Neck Head-normocephalic, atraumatic with no lesions or palpable  masses. Trachea-midline. Thyroid Gland Characteristics - normal size and consistency.  Eye Eyeball - Bilateral-Extraocular movements intact. Sclera/Conjunctiva - Bilateral-No scleral icterus.  Chest and Lung Exam Chest and lung exam reveals -quiet, even and easy respiratory effort with no use of accessory muscles and on auscultation, normal breath sounds, no adventitious sounds and normal vocal resonance. Inspection Chest Wall - Normal. Back - normal.  Breast Note: The needle biopsy tracts are palpable in the inner right breast. There is no other palpable mass in either breast. There is one small palpable lymph node that is mobile in both axillas. Other than this there are no palpable axillary, supraclavicular, or cervical lymphadenopathy   Cardiovascular Cardiovascular examination reveals -normal heart sounds, regular rate and rhythm with no murmurs and normal pedal pulses bilaterally.  Abdomen Inspection Inspection of the abdomen reveals - No Hernias. Skin - Scar - no surgical scars. Palpation/Percussion Palpation and Percussion of the abdomen reveal - Soft, Non Tender, No Rebound tenderness, No Rigidity (guarding) and No hepatosplenomegaly. Auscultation Auscultation of the abdomen reveals - Bowel sounds normal.  Neurologic Neurologic evaluation reveals -alert and oriented x 3 with no impairment of recent or remote memory. Mental Status-Normal.  Musculoskeletal Normal Exam - Left-Upper Extremity Strength Normal and Lower Extremity Strength Normal. Normal Exam - Right-Upper Extremity Strength Normal and Lower Extremity Strength Normal.  Lymphatic Head & Neck  General Head & Neck Lymphatics: Bilateral - Description - Normal. Axillary  General Axillary  Region: Bilateral - Description - Normal. Tenderness - Non Tender. Femoral & Inguinal  Generalized Femoral & Inguinal Lymphatics: Bilateral - Description - Normal. Tenderness - Non Tender.    Assessment & Plan BREAST CALCIFICATION, RIGHT (R92.1) Impression: The patient appears to have an area of calcification in the lower inner right breast that was discordant from the biopsy. Because of this the recommendation is to have this area of calcification removed. She would require a right breast radioactive seed localized lumpectomy. With her in detail the risks and benefits of the operation as well as some of the technical aspects and she understands and wishes to proceed

## 2016-02-13 NOTE — Interval H&P Note (Signed)
History and Physical Interval Note:  02/13/2016 7:14 AM  Kelsey Simon  has presented today for surgery, with the diagnosis of right breast calcifications  The various methods of treatment have been discussed with the patient and family. After consideration of risks, benefits and other options for treatment, the patient has consented to  Procedure(s): RIGHT BREAST LUMPECTOMY WITH RADIOACTIVE SEED LOCALIZATION (Right) as a surgical intervention .  The patient's history has been reviewed, patient examined, no change in status, stable for surgery.  I have reviewed the patient's chart and labs.  Questions were answered to the patient's satisfaction.     TOTH III,PAUL S

## 2016-02-13 NOTE — Transfer of Care (Signed)
Immediate Anesthesia Transfer of Care Note  Patient: Kelsey Simon  Procedure(s) Performed: Procedure(s): RIGHT BREAST LUMPECTOMY WITH RADIOACTIVE SEED LOCALIZATION (Right)  Patient Location: PACU  Anesthesia Type:General  Level of Consciousness: awake, oriented, sedated and patient cooperative  Airway & Oxygen Therapy: Patient Spontanous Breathing and Patient connected to face mask oxygen  Post-op Assessment: Report given to RN and Post -op Vital signs reviewed and stable  Post vital signs: Reviewed and stable  Last Vitals:  Vitals:   02/13/16 0654  BP: (!) 157/87  Pulse: 81  Resp: 18  Temp: 36.7 C    Last Pain:  Vitals:   02/13/16 0654  TempSrc: Oral      Patients Stated Pain Goal: 0 ([id number])  Complications: No apparent anesthesia complications

## 2016-02-13 NOTE — Op Note (Signed)
02/13/2016  8:30 AM  PATIENT:  Kelsey Simon  60 y.o. female  PRE-OPERATIVE DIAGNOSIS:  right breast calcifications  POST-OPERATIVE DIAGNOSIS:  right breast calcifications  PROCEDURE:  Procedure(s): RIGHT BREAST LUMPECTOMY WITH RADIOACTIVE SEED LOCALIZATION (Right)  SURGEON:  Surgeon(s) and Role:    * Jovita Kussmaul, MD - Primary  PHYSICIAN ASSISTANT:   ASSISTANTS: none   ANESTHESIA:   local and general  EBL:  No intake/output data recorded.  BLOOD ADMINISTERED:none  DRAINS: none   LOCAL MEDICATIONS USED:  MARCAINE     SPECIMEN:  Source of Specimen:  right breast tissue  DISPOSITION OF SPECIMEN:  PATHOLOGY  COUNTS:  YES  TOURNIQUET:  * No tourniquets in log *  DICTATION: .Dragon Dictation   After informed consent was obtained the patient was brought to the operating room and placed in the supine position on the operating room table. After adequate induction of general anesthesia the patient's right breast was prepped with ChloraPrep, allowed to dry, and draped in usual sterile manner. An appropriate timeout was performed. Previously an I-125 seed was placed in the upper inner quadrant of the right breast to mark an area of calcification that was felt to be discordant from the core biopsy. The neoprobe was set to I-125 in the area of the radioactive seed was readily identified in the upper inner right breast. The upper inner quadrant of the breast was infiltrated with quarter percent Marcaine. A small curvilinear incision was made along the upper inner border of the areola with 15 blade knife. The incision was carried through the skin and subcutaneous tissue sharply with the electrocautery. The dissection was then carried out sharply with the electrocautery in the direction of the radioactive seed. While Checking the area of radioactivity frequently with the neoprobe a circular portion of breast tissue was excised sharply around the radioactive seed. Once the specimen was  removed from the patient it was oriented with the appropriate paint colors. A specimen radiograph was obtained that showed the clip and seed and calcifications to be within and near the center of the specimen. The specimen was then sent to pathology for further evaluation. Hemostasis was achieved using the Bovie electrocautery. The wound was irrigated with saline and infiltrated with more quarter percent Marcaine. The deep layer of the wound was then closed with layers of interrupted 3-0 Vicryl stitches. The skin was then closed with interrupted 4-0 Monocryl subcuticular stitches. Dermabond dressings were applied. The patient tolerated the procedure well. At the end of the case all needle sponge and instrument counts were correct. The patient was then awakened and taken to recovery in stable condition.  PLAN OF CARE: Discharge to home after PACU  PATIENT DISPOSITION:  PACU - hemodynamically stable.   Delay start of Pharmacological VTE agent (>24hrs) due to surgical blood loss or risk of bleeding: not applicable

## 2016-02-13 NOTE — Anesthesia Procedure Notes (Signed)
Procedure Name: LMA Insertion Date/Time: 02/13/2016 7:44 AM Performed by: Lyndee Leo Pre-anesthesia Checklist: Patient identified, Emergency Drugs available, Suction available and Patient being monitored Patient Re-evaluated:Patient Re-evaluated prior to inductionOxygen Delivery Method: Circle system utilized Preoxygenation: Pre-oxygenation with 100% oxygen Intubation Type: IV induction Ventilation: Mask ventilation without difficulty LMA: LMA inserted LMA Size: 4.0 Number of attempts: 1 Airway Equipment and Method: Bite block Placement Confirmation: positive ETCO2 Tube secured with: Tape Dental Injury: Teeth and Oropharynx as per pre-operative assessment

## 2016-02-13 NOTE — Discharge Instructions (Signed)

## 2016-02-16 ENCOUNTER — Encounter (HOSPITAL_BASED_OUTPATIENT_CLINIC_OR_DEPARTMENT_OTHER): Payer: Self-pay | Admitting: General Surgery

## 2016-03-24 ENCOUNTER — Encounter: Payer: Self-pay | Admitting: Family Medicine

## 2016-03-24 ENCOUNTER — Ambulatory Visit (INDEPENDENT_AMBULATORY_CARE_PROVIDER_SITE_OTHER): Payer: BLUE CROSS/BLUE SHIELD | Admitting: Family Medicine

## 2016-03-24 DIAGNOSIS — R197 Diarrhea, unspecified: Secondary | ICD-10-CM | POA: Insufficient documentation

## 2016-03-24 NOTE — Addendum Note (Signed)
Addended by: Ellamae Sia on: 03/24/2016 04:24 PM   Modules accepted: Orders

## 2016-03-24 NOTE — Progress Notes (Signed)
Subjective:   Patient ID: Kelsey Simon, female    DOB: 26-Jul-1955, 60 y.o.   MRN: [id number]  Kelsey Simon is a pleasant 60 y.o. year old female who presents to clinic today with Diarrhea  on 03/24/2016  HPI:  2 weeks of daily, watery diarrhea. No blood or mucous in stool. No nausea, vomiting or fever.  Started after she ate some chicken at [employer].  Has been on cipro recently and some abx with recent lumpectomy.   Current Outpatient Prescriptions on File Prior to Visit  Medication Sig Dispense Refill  . Ascorbic Acid (VITAMIN C) 1000 MG tablet Take 1,000 mg by mouth daily.    Marland Kitchen b complex vitamins tablet Take 1 tablet by mouth daily.    . Calcium Carbonate-Vit D-Min (CALCIUM 600 + MINERALS PO) Take by mouth.    . Cholecalciferol (D3-1000 PO) Take one by mouth daily    . clonazePAM (KLONOPIN) 0.5 MG tablet TAKE ONE-HALF TABLET BY MOUTH AS NEEDED FOR SLEEP 30 tablet 1  . dicyclomine (BENTYL) 20 MG tablet TAKE ONE TABLET EVERY 6 HOURS AS NEEDED 30 tablet 0  . magnesium gluconate (MAGONATE) 500 MG tablet Take 500 mg by mouth daily.    Marland Kitchen pyridOXINE (VITAMIN B-6) 100 MG tablet Take 100 mg by mouth. Take by mouth 3 times a week    . triamterene-hydrochlorothiazide (MAXZIDE) 75-50 MG tablet Take 1 tablet by mouth daily. 90 tablet 3   No current facility-administered medications on file prior to visit.     Allergies  Allergen Reactions  . Penicillins Rash    REACTION: rash    Past Medical History:  Diagnosis Date  . Hypertension   . PONV (postoperative nausea and vomiting)   . Varicose veins     Past Surgical History:  Procedure Laterality Date  . BREAST LUMPECTOMY WITH RADIOACTIVE SEED LOCALIZATION Right 02/13/2016   Procedure: RIGHT BREAST LUMPECTOMY WITH RADIOACTIVE SEED LOCALIZATION;  Surgeon: Autumn Messing III, MD;  Location: [facility];  Service: General;  Laterality: Right;  . NASAL SEPTUM SURGERY  1990  . WISDOM TOOTH EXTRACTION       Family History  Problem Relation Age of Onset  . Hypertension Mother   . Cancer Father   . Heart attack Father   . Drug abuse Daughter     Social History   Social History  . Marital status: Married    Spouse name: N/A  . Number of children: N/A  . Years of education: N/A   Occupational History  . Not on file.   Social History Main Topics  . Smoking status: Former Research scientist (life sciences)  . Smokeless tobacco: Never Used     Comment: quit smoking 25 years ago  . Alcohol use Yes     Comment: wine daily  . Drug use: No  . Sexual activity: Yes   Other Topics Concern  . Not on file   Social History Narrative  . No narrative on file   The PMH, PSH, Social History, Family History, Medications, and allergies have been reviewed in [facility], and have been updated if relevant.   Review of Systems  Constitutional: Negative.   Gastrointestinal: Positive for diarrhea. Negative for abdominal distention, abdominal pain, anal bleeding, blood in stool, constipation, nausea, rectal pain and vomiting.  Genitourinary: Negative.   Musculoskeletal: Negative.   Neurological: Negative.   All other systems reviewed and are negative.      Objective:    BP 116/82   Pulse 85  Temp 98 F (36.7 C) (Oral)   Wt 143 lb (64.9 kg)   SpO2 100%   BMI 22.40 kg/m    Physical Exam  Constitutional: She is oriented to person, place, and time. She appears well-developed and well-nourished. No distress.  HENT:  Head: Normocephalic.  Eyes: Conjunctivae are normal.  Cardiovascular: Normal rate.   Pulmonary/Chest: Effort normal.  Abdominal: Soft. Bowel sounds are normal. She exhibits no distension and no mass. There is no tenderness. There is no rebound and no guarding.  Neurological: She is alert and oriented to person, place, and time. No cranial nerve deficit.  Skin: Skin is warm and dry. She is not diaphoretic.  Psychiatric: She has a normal mood and affect. Her behavior is normal. Judgment and thought  content normal.  Nursing note and vitals reviewed.         Assessment & Plan:   Diarrhea, unspecified type - Plan: C. difficile GDH and Toxin A/B, Stool culture No Follow-up on file.

## 2016-03-24 NOTE — Progress Notes (Signed)
Pre visit review using our clinic review tool, if applicable. No additional management support is needed unless otherwise documented below in the visit note. 

## 2016-03-24 NOTE — Addendum Note (Signed)
Addended by: Ellamae Sia on: 03/24/2016 04:12 PM   Modules accepted: Orders

## 2016-03-24 NOTE — Assessment & Plan Note (Signed)
New- Stool cx, rule out C diff. Start probiotic- coupon given to pt for OTC align. No other red flag symptoms or findings on exam. Call or return to clinic prn if these symptoms worsen or fail to improve as anticipated. The patient indicates understanding of these issues and agrees with the plan.

## 2016-03-24 NOTE — Patient Instructions (Signed)
Great to see you. Please stop by the lab. Happy birthday!  Try Align.  Please keep me updated.

## 2016-03-25 LAB — C. DIFFICILE GDH AND TOXIN A/B
C. difficile GDH: NOT DETECTED
C. difficile Toxin A/B: NOT DETECTED

## 2016-03-26 ENCOUNTER — Telehealth: Payer: Self-pay | Admitting: *Deleted

## 2016-03-26 DIAGNOSIS — R197 Diarrhea, unspecified: Secondary | ICD-10-CM

## 2016-03-26 NOTE — Telephone Encounter (Signed)
Spoke to pt and advised per Dr Deborra Medina. Pt states she is feeling about the same

## 2016-03-26 NOTE — Telephone Encounter (Signed)
So far stool culture is negative and C diff( the bacteria we talked about in the office) is negative.  How is she feeling?

## 2016-03-26 NOTE — Telephone Encounter (Signed)
Let's continue taking the probiotic and please update [place] on Monday.

## 2016-03-26 NOTE — Telephone Encounter (Signed)
Pt left voicemail at Triage requesting results on lab/specimens she brought on Wednesday,

## 2016-03-28 LAB — STOOL CULTURE

## 2016-03-29 ENCOUNTER — Telehealth: Payer: Self-pay | Admitting: Family Medicine

## 2016-03-29 NOTE — Telephone Encounter (Signed)
Results released

## 2016-03-29 NOTE — Telephone Encounter (Signed)
Stool cultures remain negative.  I'm sorry to hear she is still not feeling well.  Will refer to GI for persistent diarrhea.

## 2016-03-29 NOTE — Telephone Encounter (Signed)
Spoke to pt and advised per Dr Deborra Medina. Pt is agreeable to referral and was informed to await a call with the appt details

## 2016-03-29 NOTE — Telephone Encounter (Signed)
Pt is requesting labs to be releases to my chart.  Thank you

## 2016-03-29 NOTE — Telephone Encounter (Signed)
Pt left v/m; pt wants to know if any additional labs were done; pt spoke with Dr Hulen Shouts CMA; pt continues with same symptoms;diarrhea continues; immodium not helping. Pt request cb.

## 2016-03-30 ENCOUNTER — Encounter: Payer: Self-pay | Admitting: Gastroenterology

## 2016-04-09 ENCOUNTER — Telehealth: Payer: Self-pay | Admitting: *Deleted

## 2016-04-09 ENCOUNTER — Other Ambulatory Visit (INDEPENDENT_AMBULATORY_CARE_PROVIDER_SITE_OTHER): Payer: BLUE CROSS/BLUE SHIELD

## 2016-04-09 ENCOUNTER — Encounter: Payer: Self-pay | Admitting: Gastroenterology

## 2016-04-09 ENCOUNTER — Ambulatory Visit (INDEPENDENT_AMBULATORY_CARE_PROVIDER_SITE_OTHER): Payer: BLUE CROSS/BLUE SHIELD | Admitting: Gastroenterology

## 2016-04-09 VITALS — BP 158/96 | HR 78 | Ht 66.5 in | Wt 145.2 lb

## 2016-04-09 DIAGNOSIS — R197 Diarrhea, unspecified: Secondary | ICD-10-CM

## 2016-04-09 LAB — CBC WITH DIFFERENTIAL/PLATELET
Basophils Absolute: 0 10*3/uL (ref 0.0–0.1)
Basophils Relative: 0.4 % (ref 0.0–3.0)
Eosinophils Absolute: 0 10*3/uL (ref 0.0–0.7)
Eosinophils Relative: 0.7 % (ref 0.0–5.0)
HCT: 41.2 % (ref 36.0–46.0)
Hemoglobin: 13.9 g/dL (ref 12.0–15.0)
Lymphocytes Relative: 19.5 % (ref 12.0–46.0)
Lymphs Abs: 1.3 10*3/uL (ref 0.7–4.0)
MCHC: 33.8 g/dL (ref 30.0–36.0)
MCV: 82.7 fl (ref 78.0–100.0)
Monocytes Absolute: 0.4 10*3/uL (ref 0.1–1.0)
Monocytes Relative: 6.7 % (ref 3.0–12.0)
Neutro Abs: 4.7 10*3/uL (ref 1.4–7.7)
Neutrophils Relative %: 72.7 % (ref 43.0–77.0)
Platelets: 336 10*3/uL (ref 150.0–400.0)
RBC: 4.98 Mil/uL (ref 3.87–5.11)
RDW: 12.6 % (ref 11.5–15.5)
WBC: 6.5 10*3/uL (ref 4.0–10.5)

## 2016-04-09 LAB — COMPREHENSIVE METABOLIC PANEL
ALT: 18 U/L (ref 0–35)
AST: 16 U/L (ref 0–37)
Albumin: 4.4 g/dL (ref 3.5–5.2)
Alkaline Phosphatase: 40 U/L (ref 39–117)
BUN: 13 mg/dL (ref 6–23)
CO2: 28 mEq/L (ref 19–32)
Calcium: 9.9 mg/dL (ref 8.4–10.5)
Chloride: 100 mEq/L (ref 96–112)
Creatinine, Ser: 0.78 mg/dL (ref 0.40–1.20)
GFR: 79.52 mL/min (ref >60.00)
Glucose, Bld: 108 mg/dL — ABNORMAL HIGH (ref 70–99)
Potassium: 3.9 mEq/L (ref 3.5–5.1)
Sodium: 139 mEq/L (ref 135–145)
Total Bilirubin: 0.5 mg/dL (ref 0.2–1.2)
Total Protein: 7.1 g/dL (ref 6.0–8.3)

## 2016-04-09 LAB — TSH: TSH: 0.65 u[IU]/mL (ref 0.35–4.50)

## 2016-04-09 MED ORDER — RIFAXIMIN 550 MG PO TABS: 550.0000 mg | ORAL_TABLET | Freq: Three times a day (TID) | ORAL | 0 refills | Status: AC

## 2016-04-09 NOTE — Progress Notes (Signed)
04/09/2016 BLESSING ASKAR [id number] 09-Nov-1955   HISTORY OF PRESENT ILLNESS:  This is a 60 year old female known to Dr. Carlean Purl.  Last colonoscopy 12/2007 at which time she was found to have sigmoid diverticulosis and small internal hemorrhoids.  Repeat recommended in 10 years from that time.  She is here today at the request of her PCP, Dr. Deborra Medina, for evaluation of diarrhea.  Says that she had sudden onset of diarrhea 4 weeks ago; came on suddenly after eating lunch one day and has been persistent in varying degrees since that time.  Sometimes up to 6 times per day with very watery and explosive diarrhea.  Denies any abdominal pain, but has been uncomfortable on both sides with a lot of gurgling noises.  Has been taking IBgard which has helped some with the bloating and gassy discomfort.  Bentyl did not help.  Stool study for Cdiff and stool culture were negative.  Says that she does not like to take probiotics because they seemed to make her more bloated, etc in the past.  No nausea, vomiting, fevers, chills, rectal bleeding, etc.   Past Medical History:  Diagnosis Date  . Hypertension   . IBS (irritable bowel syndrome)   . PONV (postoperative nausea and vomiting)   . Varicose veins    Past Surgical History:  Procedure Laterality Date  . BREAST LUMPECTOMY WITH RADIOACTIVE SEED LOCALIZATION Right 02/13/2016   Procedure: RIGHT BREAST LUMPECTOMY WITH RADIOACTIVE SEED LOCALIZATION;  Surgeon: Autumn Messing III, MD;  Location: [place];  Service: General;  Laterality: Right;  . NASAL SEPTUM SURGERY  1990  . WISDOM TOOTH EXTRACTION      reports that she has quit smoking. She has never used smokeless tobacco. She reports that she drinks alcohol. She reports that she does not use drugs. family history includes Colon cancer in her paternal grandfather; Drug abuse in her daughter; Heart attack in her father; Lung cancer in her father. Allergies  Allergen Reactions  . Penicillins  Rash    REACTION: rash      Outpatient Encounter Prescriptions as of 04/09/2016  Medication Sig  . triamterene-hydrochlorothiazide (MAXZIDE) 75-50 MG tablet Take 1 tablet by mouth daily.  . [DISCONTINUED] Ascorbic Acid (VITAMIN C) 1000 MG tablet Take 1,000 mg by mouth daily.  . [DISCONTINUED] b complex vitamins tablet Take 1 tablet by mouth daily.  . [DISCONTINUED] Calcium Carbonate-Vit D-Min (CALCIUM 600 + MINERALS PO) Take by mouth.  . [DISCONTINUED] Cholecalciferol (D3-1000 PO) Take one by mouth daily  . [DISCONTINUED] clonazePAM (KLONOPIN) 0.5 MG tablet TAKE ONE-HALF TABLET BY MOUTH AS NEEDED FOR SLEEP  . [DISCONTINUED] dicyclomine (BENTYL) 20 MG tablet TAKE ONE TABLET EVERY 6 HOURS AS NEEDED  . [DISCONTINUED] magnesium gluconate (MAGONATE) 500 MG tablet Take 500 mg by mouth daily.  . [DISCONTINUED] pyridOXINE (VITAMIN B-6) 100 MG tablet Take 100 mg by mouth. Take by mouth 3 times a week   No facility-administered encounter medications on file as of 04/09/2016.      REVIEW OF SYSTEMS  : All other systems reviewed and negative except where noted in the History of Present Illness.   PHYSICAL EXAM: BP (!) 158/96   Pulse 78   Ht 5' 6.5" (1.689 m)   Wt 145 lb 4 oz (65.9 kg)   BMI 23.09 kg/m  General: Well developed white female in no acute distress Head: Normocephalic and atraumatic Eyes:  Sclerae anicteric, conjunctiva pink. Ears: Normal auditory acuity Lungs: Clear throughout to  auscultation Heart: Regular rate and rhythm Abdomen: Soft, non-distended.  Normal bowel sounds.  Mild diffuse TTP. Musculoskeletal: Symmetrical with no gross deformities  Skin: No lesions on visible extremities Extremities: No edema  Neurological: Alert oriented x 4, grossly non-focal Psychological:  Alert and cooperative. Normal mood and affect  ASSESSMENT AND PLAN: -Acute diarrhea:  Began suddenly 4 weeks ago and has been persistent in varying degrees.  Has history of IBS but never any symptoms  similar to this in the past.  Will check CBC, CMP, and TSH.  Stool culture and Cdiff negative.  Will try a course of Xifaxan 550 mg TID for 14 days.  Will call back in 2-3 weeks with update on symptoms.   CC:  Lucille Passy, MD

## 2016-04-09 NOTE — Patient Instructions (Addendum)
Please go to the basement level to have your labs drawn.  We sent a presccription for Xifaxan 550 mg to [facility].  We have also given you a savings card to give to the pharmacy.   Call us back in 2-3 weeks with an update.

## 2016-04-09 NOTE — Telephone Encounter (Signed)
I called the patient to advise I did the Cover my Meds prior authorization for the Xofaxan . I told her I will call her when I find out the outcome.

## 2016-04-09 NOTE — Progress Notes (Signed)
Agree with Ms. Alphia Kava management.   If Xifaxan fails to relieve her sxs seems like a colonoscopy with biopsies is a reasonable next step.  Gatha Mayer, MD, [facility]

## 2016-04-12 NOTE — Telephone Encounter (Signed)
Called pharmacist at [facility]. He advised me that the cost for the Xifaxan 550 mg is under $20.00.  I called him to let them know the prior  Authorization was approved.  I called the patient and left a message on her cell number  that her co-pay is under $20.00.

## 2016-04-20 DIAGNOSIS — H524 Presbyopia: Secondary | ICD-10-CM | POA: Diagnosis not present

## 2016-06-14 ENCOUNTER — Ambulatory Visit (INDEPENDENT_AMBULATORY_CARE_PROVIDER_SITE_OTHER): Payer: BLUE CROSS/BLUE SHIELD | Admitting: Internal Medicine

## 2016-06-14 ENCOUNTER — Encounter: Payer: Self-pay | Admitting: Internal Medicine

## 2016-06-14 VITALS — BP 160/98 | HR 61 | Temp 98.4°F | Ht 66.5 in | Wt 148.0 lb

## 2016-06-14 DIAGNOSIS — J069 Acute upper respiratory infection, unspecified: Secondary | ICD-10-CM

## 2016-06-14 DIAGNOSIS — B9789 Other viral agents as the cause of diseases classified elsewhere: Secondary | ICD-10-CM | POA: Diagnosis not present

## 2016-06-14 DIAGNOSIS — J029 Acute pharyngitis, unspecified: Secondary | ICD-10-CM

## 2016-06-14 LAB — POCT RAPID STREP A (OFFICE): Rapid Strep A Screen: NEGATIVE

## 2016-06-14 MED ORDER — METHYLPREDNISOLONE ACETATE 80 MG/ML IJ SUSP
80.0000 mg | Freq: Once | INTRAMUSCULAR | Status: AC
  Administered 2016-06-14: 80 mg via INTRAMUSCULAR

## 2016-06-14 MED ORDER — HYDROCODONE-HOMATROPINE 5-1.5 MG/5ML PO SYRP: 5.0000 mL | ORAL_SOLUTION | Freq: Three times a day (TID) | ORAL | 0 refills | Status: DC | PRN

## 2016-06-14 NOTE — Patient Instructions (Signed)
Upper Respiratory Infection, Adult Most upper respiratory infections (URIs) are caused by a virus. A URI affects the nose, throat, and upper air passages. The most common type of URI is often called "the common cold." Follow these instructions at home:  Take medicines only as told by your doctor.  Gargle warm saltwater or take cough drops to comfort your throat as told by your doctor.  Use a warm mist humidifier or inhale steam from a shower to increase air moisture. This may make it easier to breathe.  Drink enough fluid to keep your pee (urine) clear or pale yellow.  Eat soups and other clear broths.  Have a healthy diet.  Rest as needed.  Go back to work when your fever is gone or your doctor says it is okay.  You may need to stay home longer to avoid giving your URI to others.  You can also wear a face mask and wash your hands often to prevent spread of the virus.  Use your inhaler more if you have asthma.  Do not use any tobacco products, including cigarettes, chewing tobacco, or electronic cigarettes. If you need help quitting, ask your doctor. Contact a doctor if:  You are getting worse, not better.  Your symptoms are not helped by medicine.  You have chills.  You are getting more short of breath.  You have brown or red mucus.  You have yellow or brown discharge from your nose.  You have pain in your face, especially when you bend forward.  You have a fever.  You have puffy (swollen) neck glands.  You have pain while swallowing.  You have white areas in the back of your throat. Get help right away if:  You have very bad or constant:  Headache.  Ear pain.  Pain in your forehead, behind your eyes, and over your cheekbones (sinus pain).  Chest pain.  You have long-lasting (chronic) lung disease and any of the following:  Wheezing.  Long-lasting cough.  Coughing up blood.  A change in your usual mucus.  You have a stiff neck.  You have  changes in your:  Vision.  Hearing.  Thinking.  Mood. This information is not intended to replace advice given to you by your health care provider. Make sure you discuss any questions you have with your health care provider. Document Released: 11/03/2007 Document Revised: 01/18/2016 Document Reviewed: 08/22/2013 Elsevier Interactive Patient Education  2017 Elsevier Inc.  

## 2016-06-14 NOTE — Progress Notes (Signed)
HPI  Pt presents to the clinic today with c/o headache, nasal congestion, sore throat and cough. This started 4 days ago. The headache is located in her left temple. She is blowing yellow mucous out of her nose. She denies difficulty swallowing. The cough is non productive. She denies fever but has had chills and body aches. She has not tried anything OTC for this. She has no history of allergy or breathing problems. She has had sick contacts. She has had her flu shot.  Review of Systems        Past Medical History:  Diagnosis Date  . Hypertension   . IBS (irritable bowel syndrome)   . PONV (postoperative nausea and vomiting)   . Varicose veins     Family History  Problem Relation Age of Onset  . Heart attack Father   . Lung cancer Father   . Drug abuse Daughter   . Colon cancer Paternal Grandfather   . Stomach cancer Neg Hx   . Esophageal cancer Neg Hx   . Rectal cancer Neg Hx   . Liver cancer Neg Hx     Social History   Social History  . Marital status: Married    Spouse name: N/A  . Number of children: 1  . Years of education: N/A   Occupational History  . Manager    Social History Main Topics  . Smoking status: Former Research scientist (life sciences)  . Smokeless tobacco: Never Used     Comment: quit smoking 25 years ago  . Alcohol use Yes     Comment: wine daily  . Drug use: No  . Sexual activity: Yes   Other Topics Concern  . Not on file   Social History Narrative  . No narrative on file    Allergies  Allergen Reactions  . Penicillins Rash    REACTION: rash     Constitutional: Positive headache, fatigu. Denies fever or abrupt weight changes.  HEENT:  Positive nasal congestion, sore throat. Denies eye redness, eye pain, pressure behind the eyes, facial pain, ear pain, ringing in the ears, wax buildup, runny nose or bloody nose. Respiratory: Positive cough. Denies difficulty breathing or shortness of breath.  Cardiovascular: Denies chest pain, chest tightness, palpitations  or swelling in the hands or feet.   No other specific complaints in a complete review of systems (except as listed in HPI above).  Objective:   BP (!) 160/98   Pulse 61   Temp 98.4 F (36.9 C) (Oral)   Ht 5' 6.5" (1.689 m)   Wt 148 lb (67.1 kg)   SpO2 99%   BMI 23.53 kg/m  Wt Readings from Last 3 Encounters:  06/14/16 148 lb (67.1 kg)  04/09/16 145 lb 4 oz (65.9 kg)  03/24/16 143 lb (64.9 kg)     General: Appears her stated age, well developed, well nourished in NAD. HEENT: Head: normal shape and size, no sinus tenderness noted; Eyes: sclera white, no icterus, conjunctiva pink; Ears: Tm's pink but ntact, normal light reflex; Nose: mucosa pink and moist, septum midline; Throat/Mouth: + PND. Teeth present, mucosa erythematous and moist, no exudate noted, no lesions or ulcerations noted.  Neck: No cervical lymphadenopathy.  Cardiovascular: Normal rate and rhythm. S1,S2 noted.  No murmur, rubs or gallops noted.  Pulmonary/Chest: Normal effort and positive vesicular breath sounds. No respiratory distress. No wheezes, rales or ronchi noted.       Assessment & Plan:   Viral Upper Respiratory Infection with Cough:  RST: negative Get  some rest and drink plenty of water Do salt water gargles for the sore throat 80 mg Depo IM today Rx for Hycodan cough syrup  RTC as needed or if symptoms persist.   Webb Silversmith, NP

## 2016-06-14 NOTE — Addendum Note (Signed)
Addended by: Jacqualin Combes on: 06/14/2016 12:08 PM   Modules accepted: Orders

## 2016-06-14 NOTE — Progress Notes (Signed)
Pre visit review using our clinic review tool, if applicable. No additional management support is needed unless otherwise documented below in the visit note. 

## 2016-06-14 NOTE — Addendum Note (Signed)
Addended by: Pilar Grammes on: 06/14/2016 12:07 PM   Modules accepted: Orders

## 2016-06-23 ENCOUNTER — Telehealth: Payer: Self-pay

## 2016-06-23 MED ORDER — HYDROCODONE-HOMATROPINE 5-1.5 MG/5ML PO SYRP: 5.0000 mL | ORAL_SOLUTION | Freq: Three times a day (TID) | ORAL | 0 refills | Status: DC | PRN

## 2016-06-23 NOTE — Telephone Encounter (Signed)
RX for [employer] and signed

## 2016-06-23 NOTE — Telephone Encounter (Signed)
Rx left in front office for pick up and pt is aware Pt's husband will pick up

## 2016-06-23 NOTE — Telephone Encounter (Signed)
Pt left v/m; pt seen 06/14/16; pt requesting rx for hycodan. Cough is better but pt still coughing at night and pt missed work last week; pt thinks if could rest at night could work this week. Pt request cb. Pt has meeting this afternoon and request cb ASAP. Pt last seen and last printed # 120 ml on 06/14/16.

## 2016-07-08 ENCOUNTER — Telehealth: Payer: Self-pay | Admitting: *Deleted

## 2016-07-08 MED ORDER — BENZONATATE 200 MG PO CAPS: 200.0000 mg | ORAL_CAPSULE | Freq: Two times a day (BID) | ORAL | 0 refills | Status: DC | PRN

## 2016-07-08 NOTE — Telephone Encounter (Signed)
Received message on voicemail from pt, requesting something for cough other than Codeine. Pt can not take Codeine during the day. Mucinex not helping. Still having cough from visit 06/14/16.

## 2016-07-08 NOTE — Addendum Note (Signed)
Addended by: Jearld Fenton on: 07/08/2016 03:07 PM   Modules accepted: Orders

## 2016-07-08 NOTE — Telephone Encounter (Signed)
I ordered Benzonate but it printed, since she didn't have a pharmacy on file. Can you see what pharmacy she wants it sent to and we can fax or phone it in?

## 2016-07-08 NOTE — Addendum Note (Signed)
Addended by: Lurlean Nanny on: 07/08/2016 03:11 PM   Modules accepted: Orders

## 2016-08-19 DIAGNOSIS — H5213 Myopia, bilateral: Secondary | ICD-10-CM | POA: Diagnosis not present

## 2016-09-29 DIAGNOSIS — S61432A Puncture wound without foreign body of left hand, initial encounter: Secondary | ICD-10-CM | POA: Diagnosis not present

## 2016-09-29 DIAGNOSIS — W5501XA Bitten by cat, initial encounter: Secondary | ICD-10-CM | POA: Diagnosis not present

## 2016-09-30 ENCOUNTER — Telehealth: Payer: Self-pay

## 2016-09-30 NOTE — Telephone Encounter (Signed)
PLEASE NOTE: All timestamps contained within this report are represented as [place] Standard Time. CONFIDENTIALTY NOTICE: This fax transmission is intended only for the addressee. It contains information that is legally privileged, confidential or otherwise protected from use or disclosure. If you are not the intended recipient, you are strictly prohibited from reviewing, disclosing, copying using or disseminating any of this information or taking any action in reliance on or regarding this information. If you have received this fax in error, please notify us immediately by telephone so that we can arrange for its return to [place]. Phone: [phone], Toll-Free: [phone], Fax: [phone] Page: 1 of 1 Call Id: [id number] [place] Patient Name: Kelsey Simon Gender: Female DOB: 1956-05-26 Age: 61 Y 68 M 13 D Return Phone Number: [phone] (Primary) City/State/Zip: [place] Client [place] Night - Client Client Site [place] - Night Physician Arnette Norris - MD Who Is Calling Patient / Member / Family / Caregiver Call Type Triage / Clinical Relationship To Patient Self Return Phone Number [phone] (Primary) Chief Complaint Animal Bite Reason for Call Symptomatic / Request for [place] said she was been bitten by her cat. It current on rabies. Did break the skin. Will be in a meeting started at 7pm. If the nurse could leave a message. Nurse Assessment Nurse: Wells, RN, Marliss Coots Date/Time (Eastern Time): 09/29/2016 6:28:16 PM Does the PT have any chronic conditions? (i.e. diabetes, asthma, etc.) ---No Guidelines Guideline Title Affirmed Question Animal Bite [1] Puncture wound (hole through the skin) from claws or teeth AND [2] cat Disp. Time Eilene Ghazi Time) Disposition Final User 09/29/2016 6:35:40 PM See Physician  within 4 Hours (or PCP triage) Yes Rock Nephew, RN, Belinda Referrals GO TO [place] Advice Given Per Guideline SEE PHYSICIAN WITHIN 4 HOURS (or PCP triage): * IF OFFICE WILL BE OPEN: You need to be seen within the next 3 or 4 hours. Call your doctor's office now or as soon as it opens. * IF OFFICE WILL BE CLOSED AND NO PCP TRIAGE: You need to be seen within the next 3 or 4 hours. A nearby Urgent Care Center is often a good source of care. Another choice is to go to the ER. Go sooner if you become worse. CLEANSE THE WOUND: * Wash all minor wounds immediately with soap and water for 5 minutes. * Flush vigorously under faucet (Reason: can prevent many wound infections). * Scrub wound enough to make it re-bleed a little (Reason: to help with cleaning out the wound). CALL BACK IF: * You become worse. CARE ADVICE given per Animal Bite (Adult) guideline.

## 2016-09-30 NOTE — Telephone Encounter (Signed)
PLEASE NOTE: All timestamps contained within this report are represented as [place] Standard Time. CONFIDENTIALTY NOTICE: This fax transmission is intended only for the addressee. It contains information that is legally privileged, confidential or otherwise protected from use or disclosure. If you are not the intended recipient, you are strictly prohibited from reviewing, disclosing, copying using or disseminating any of this information or taking any action in reliance on or regarding this information. If you have received this fax in error, please notify us immediately by telephone so that we can arrange for its return to [place]. Phone: [phone], Toll-Free: [phone], Fax: [phone] Page: 1 of 2 Call Id: [id number] [place] Patient Name: Kelsey Simon Gender: Female DOB: 27-Nov-1955 Age: 61 Y 43 M 13 D Return Phone Number: [phone] (Primary) City/State/Zip: [place] Client [facility] Night - Client Client Site [place] - Night Physician Arnette Norris - MD Who Is Calling Patient / Member / Family / Caregiver Call Type Triage / Clinical Relationship To Patient Self Return Phone Number [phone] (Primary) Chief Complaint Animal Bite Reason for Call Symptomatic / Request for [place] said she was been bitten by her cat. It current on rabies. Did break the skin. Will be in a meeting started at 7pm. If the nurse could leave a message. Nurse Assessment Nurse: Rock Nephew, RN, Marliss Coots Date/Time (Eastern Time): 09/29/2016 6:28:16 PM Confirm and document reason for call. If symptomatic, describe symptoms. ---Caller states that she was bitten by her cat. She has puncture marks on her hand around her thumb and it is very sore. Does the PT have any chronic conditions? (i.e. diabetes, asthma,  etc.) ---No Guidelines Guideline Title Affirmed Question Animal Bite [1] Puncture wound (hole through the skin) from claws or teeth AND [2] cat Disp. Time Eilene Ghazi Time) Disposition Final User 09/29/2016 6:35:40 PM See Physician within 4 Hours (or PCP triage) Yes Rock Nephew, RN, Belinda Referrals GO TO [facility] Advice Given Per Guideline SEE PHYSICIAN WITHIN 4 HOURS (or PCP triage): * IF OFFICE WILL BE OPEN: You need to be seen within the next 3 or 4 hours. Call your doctor's office now or as soon as it opens. * IF OFFICE WILL BE CLOSED AND NO PCP TRIAGE: You need to be seen within the next 3 or 4 hours. A nearby Urgent Care Center is often a good source of care. Another choice is to go to the ER. Go sooner if you become worse. CLEANSE THE WOUND: * Wash all minor wounds immediately with soap and water for 5 minutes. * Flush vigorously under faucet (Reason: can prevent many wound infections). * Scrub wound enough to make it re-bleed a little (Reason: to help with cleaning out the wound). CALL BACK IF: * You become worse. CARE ADVICE given per Animal Bite (Adult) guideline. After Care Instructions Given Call Event Type User Date / Time Description Education document email Rock Nephew RN, Marliss Coots 09/29/2016 6:40:48 PM [facility] Connect Now Instructions PLEASE NOTE: All timestamps contained within this report are represented as [place] Standard Time. CONFIDENTIALTY NOTICE: This fax transmission is intended only for the addressee. It contains information that is legally privileged, confidential or otherwise protected from use or disclosure. If you are not the intended recipient, you are strictly prohibited from reviewing, disclosing, copying using or disseminating any of this information or taking any action in reliance on or regarding this information.  If you have received this fax in error, please notify us immediately by telephone so that we can arrange for its return to [place]. Phone:  [phone], Toll-Free: [phone], Fax: [phone] Page: 2 of 2 Call Id: [id number] Comments User: Demetrios Isaacs, RN Date/Time Eilene Ghazi Time): 09/29/2016 6:41:49 PM Caller was emailed instructions for Telehealth for her selected method of treatment. Caller agreed with recommendation but was unable to go to the UC or ED within the four hour time frame.

## 2016-09-30 NOTE — Telephone Encounter (Signed)
I spoke with pt; pt was in [place] and pt went to [facility] and got abx and TD and will cb if needed.FYI to Dr Deborra Medina.

## 2016-10-05 ENCOUNTER — Other Ambulatory Visit: Payer: Self-pay | Admitting: Family Medicine

## 2016-10-05 DIAGNOSIS — Z1231 Encounter for screening mammogram for malignant neoplasm of breast: Secondary | ICD-10-CM

## 2016-10-19 ENCOUNTER — Other Ambulatory Visit: Payer: Self-pay

## 2016-10-19 MED ORDER — CLONAZEPAM 0.5 MG PO TABS: ORAL_TABLET | ORAL | 1 refills | Status: DC

## 2016-10-19 NOTE — Telephone Encounter (Signed)
Called in to [facility], pt aware.

## 2016-10-19 NOTE — Telephone Encounter (Signed)
Pt left v/m requesting refill clonazepam (last refilled # 30 x 1 on 07/21/15) pt last seen 01/13/16 for f/u appt.Kelsey Simon. Pt request cb when filled.

## 2016-10-21 ENCOUNTER — Ambulatory Visit: Payer: BLUE CROSS/BLUE SHIELD

## 2016-11-04 ENCOUNTER — Ambulatory Visit: Payer: BLUE CROSS/BLUE SHIELD

## 2016-11-05 ENCOUNTER — Ambulatory Visit: Payer: BLUE CROSS/BLUE SHIELD

## 2016-11-10 ENCOUNTER — Ambulatory Visit
Admission: RE | Admit: 2016-11-10 | Discharge: 2016-11-10 | Disposition: A | Discharge status: Home / Self Care | Payer: BLUE CROSS/BLUE SHIELD | Source: Ambulatory Visit | Attending: Family Medicine | Admitting: Family Medicine

## 2016-11-10 DIAGNOSIS — Z1231 Encounter for screening mammogram for malignant neoplasm of breast: Secondary | ICD-10-CM | POA: Diagnosis not present

## 2016-11-12 ENCOUNTER — Other Ambulatory Visit: Payer: Self-pay | Admitting: Family Medicine

## 2016-11-12 DIAGNOSIS — R928 Other abnormal and inconclusive findings on diagnostic imaging of breast: Secondary | ICD-10-CM

## 2016-12-18 ENCOUNTER — Other Ambulatory Visit: Payer: Self-pay | Admitting: Family Medicine

## 2016-12-19 IMAGING — MG STEREOTACTIC VACUUM ASSIST RIGHT
8 of 10 series · 8 of 18 positions shown · non-contrast
Comparison: Previous exams.

PATHOLOGY:
Site 1: Distortion: Coil shaped clip: Fibrocystic changes with
adenosis and associated calcification in the upper inner quadrant of
the right breast. Results are felt to be discordant with the imaging
findings. Excision is recommended.

Site 2: Calcifications: X shaped clip: The lower inner quadrant of
the right breast showed fibrocystic changes with associated
calcifications. Findings are concordant at site 2.
Pathology results were discussed with the patient by telephone. The
patient reported doing well after the biopsy. Post biopsy
instructions and care were reviewed and questions were answered. The
patient was encouraged to call [REDACTED] for any additional concerns.
Patient scheduled for Surgical consultation with Dr. Tiger on
11/12/2015.
CLINICAL DATA: The patient presents for stereotactic guided core
biopsy of right breast distortion and right breast calcifications.
EXAM:
RIGHT BREAST STEREOTACTIC CORE NEEDLE BIOPSY x2

[R MLO (1 of 8)]
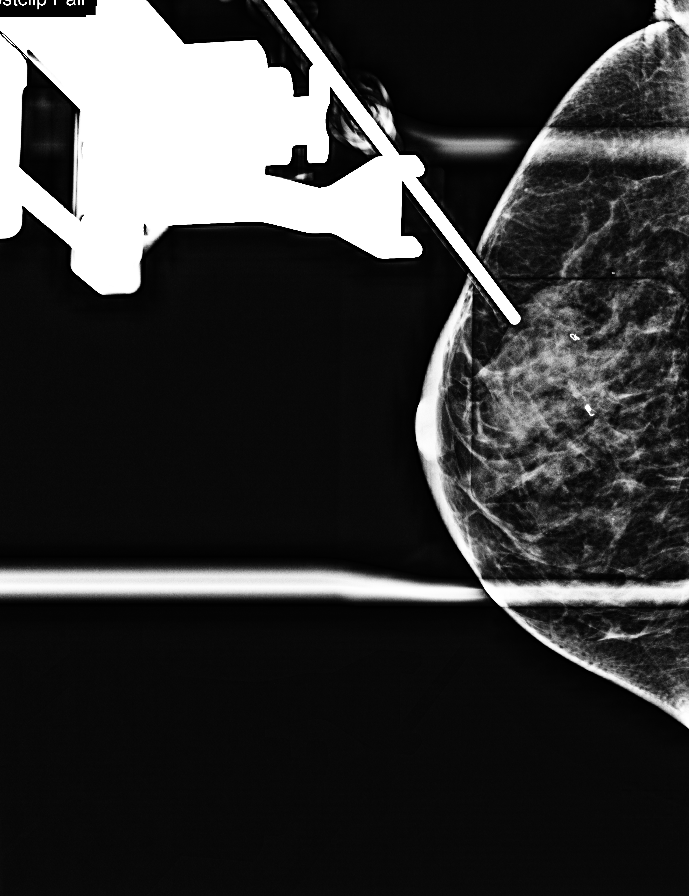

[R MLO (2 of 8)]
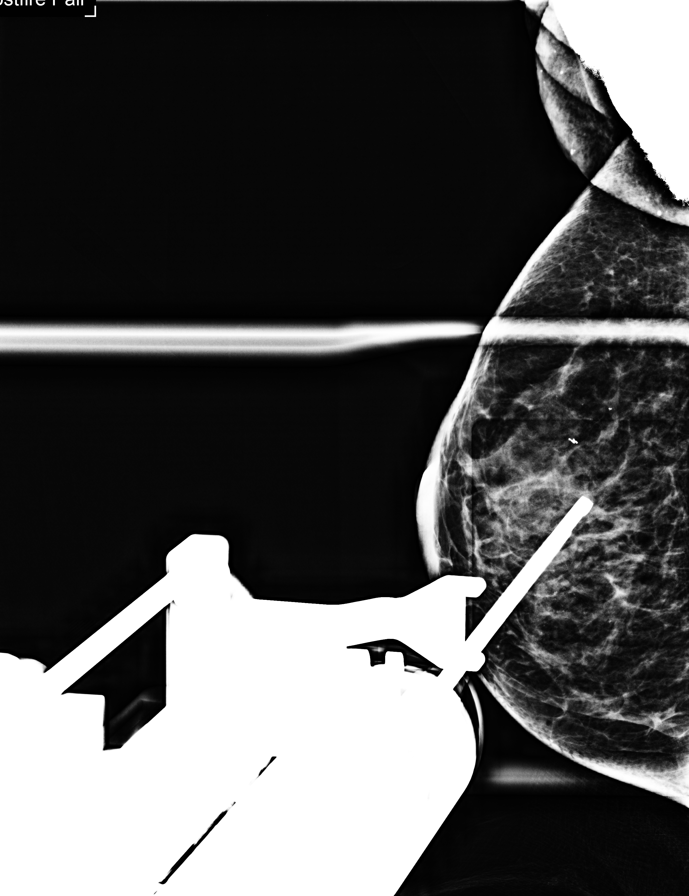

[R MLO (3 of 8)]
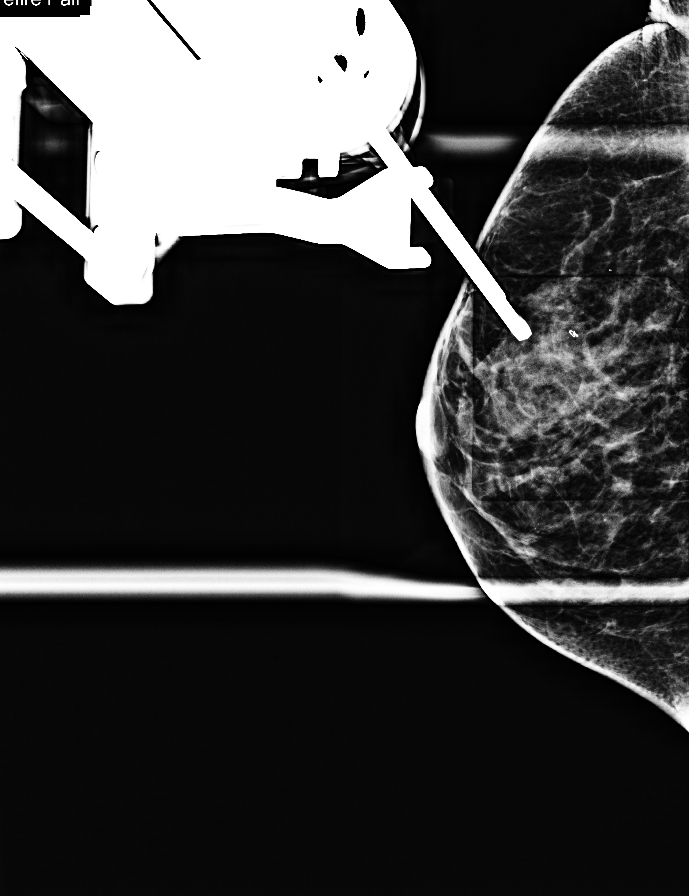

[R MLO (4 of 8)]
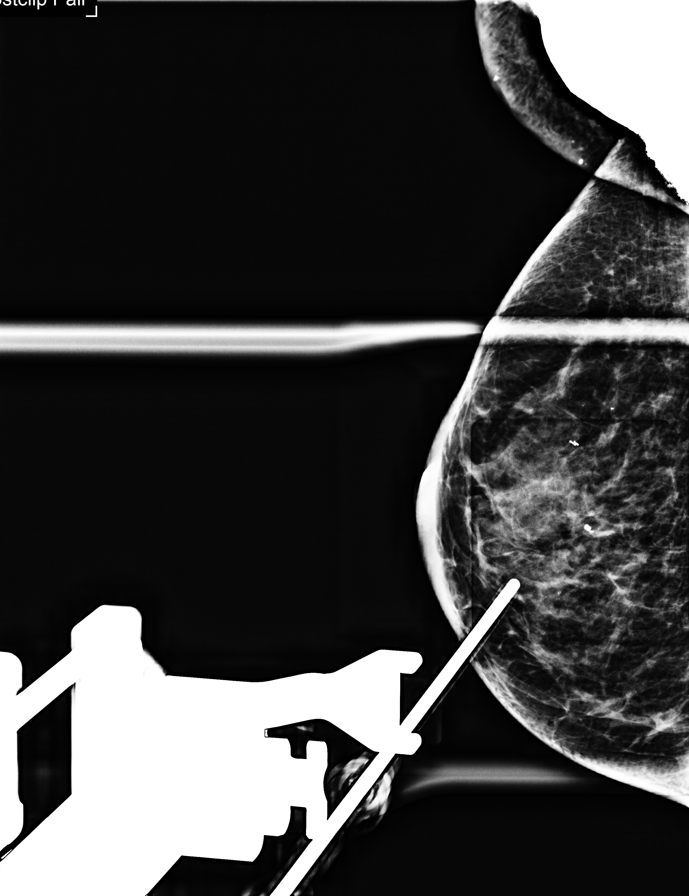

[R MLO (5 of 8)]
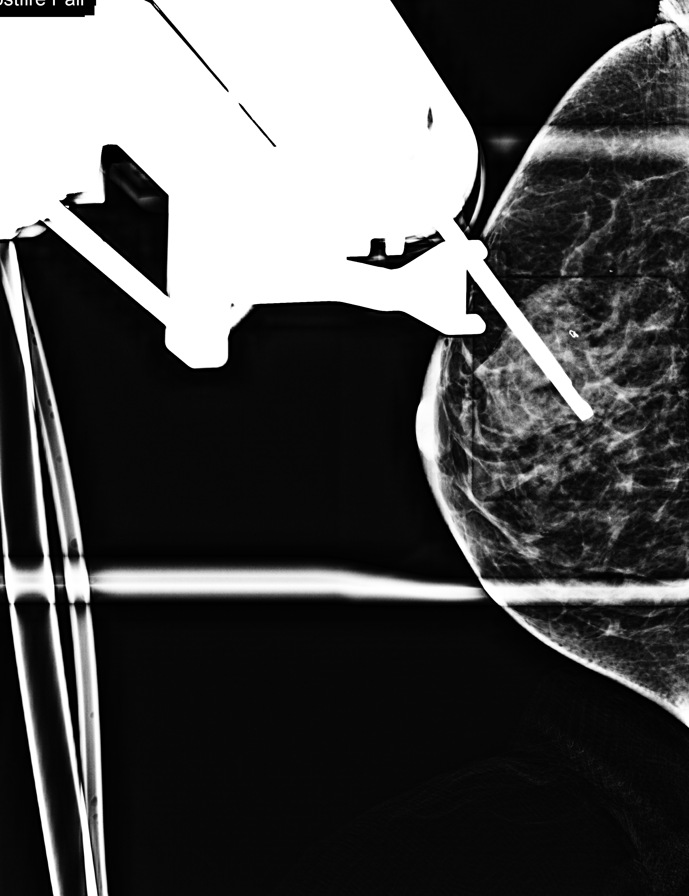

[R MLO (6 of 8)]
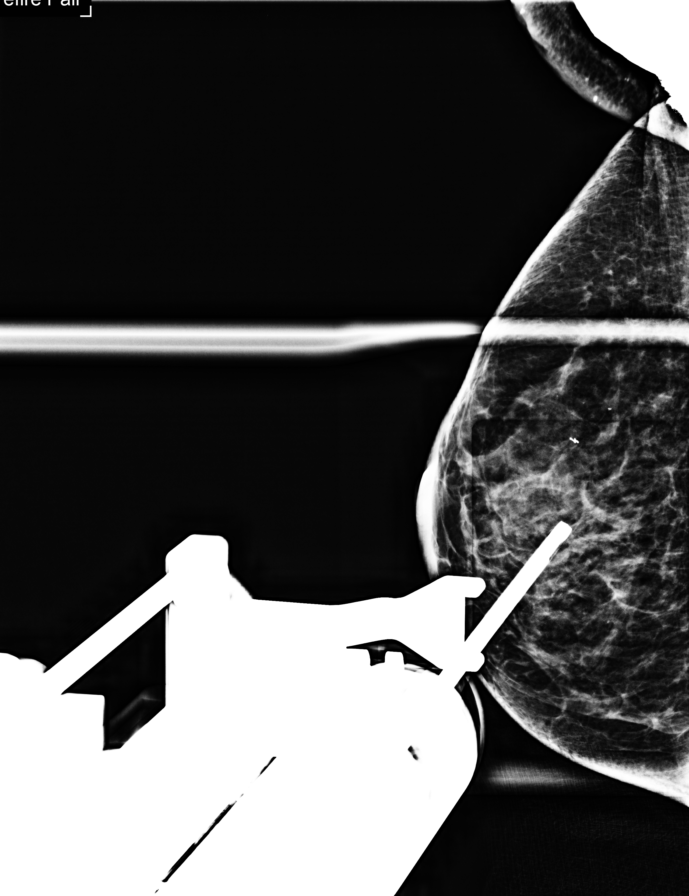

[R MLO (7 of 8)]
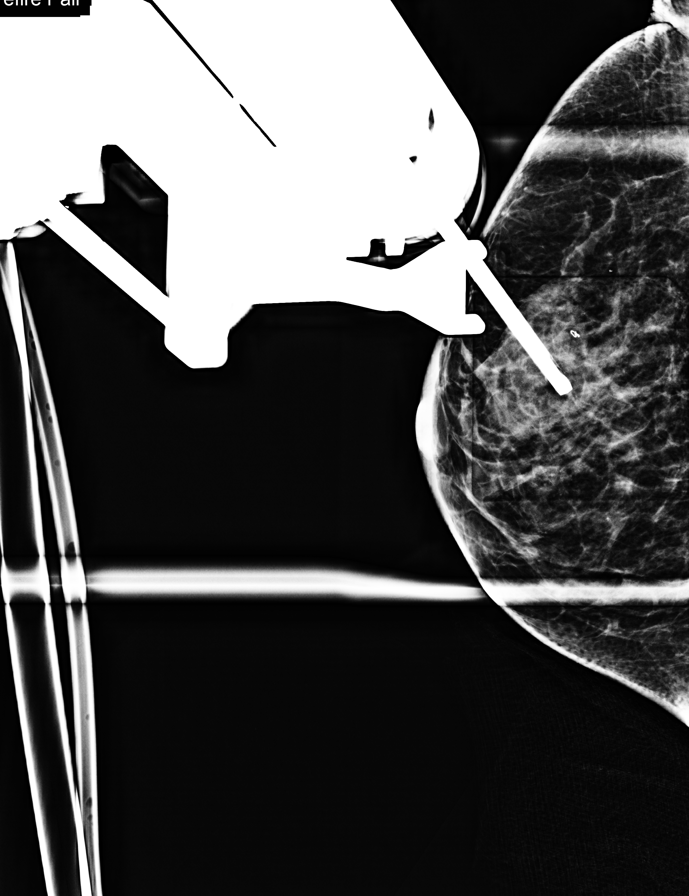

[R MLO (8 of 8)]
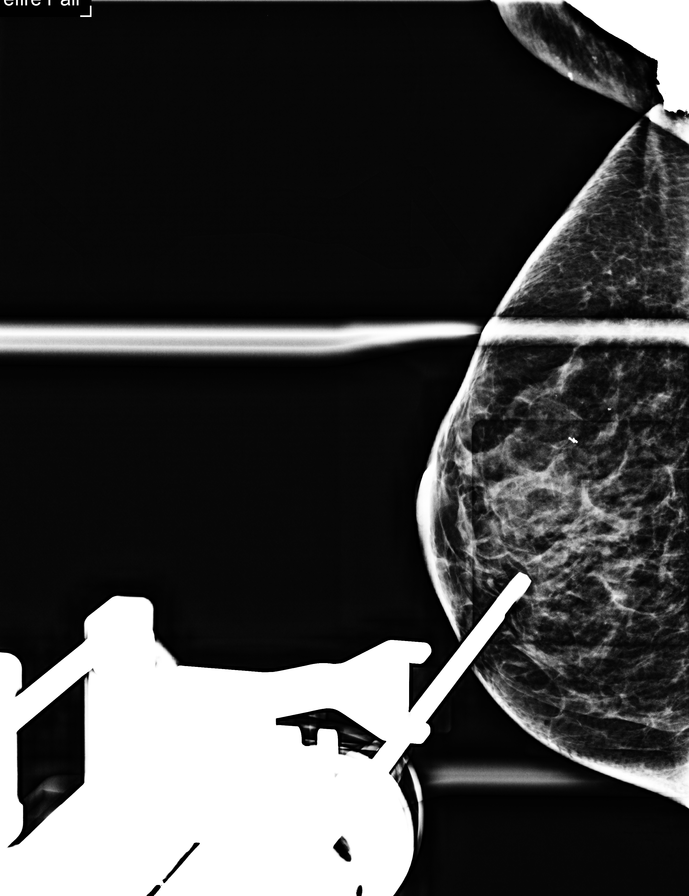

[8 of 18 positions shown; findings below may reference images not displayed]



Site 1:  Distortion ( coil shaped clip)

Using sterile technique and 1% Lidocaine as local anesthetic, under
stereotactic guidance, a 9 gauge vacuum assisted device was used to
perform core needle biopsy of distortion in the upper inner quadrant
of the right breast using a medial approach. A coil shaped clip is
deployed into the biopsy cavity.

Site 2: Calcifications (X shaped clip)

Using sterile technique, 1% lidocaine as local anesthetic, under
ultrasound guidance, a 9 gauge vacuum assisted device was used to
perform core needle biopsy of calcifications in the lower inner
quadrant of the right breast using a medial approach. Specimen
radiograph confirms the calcifications are present. Specimens with
calcifications are identified for pathology.

At the conclusion of the procedure, a X shaped tissue marker clip
was deployed into the biopsy cavity.

Follow-up 2-view mammogram was performed and dictated separately.
IMPRESSION: Stereotactic-guided biopsy of right breast distortion and right
breast calcifications. No apparent complications.

## 2016-12-20 NOTE — Telephone Encounter (Signed)
This rx is pt's history med list. Last refill 07/21/15, ok to refill?

## 2016-12-28 ENCOUNTER — Encounter: Payer: Self-pay | Admitting: Family Medicine

## 2016-12-28 ENCOUNTER — Ambulatory Visit (INDEPENDENT_AMBULATORY_CARE_PROVIDER_SITE_OTHER): Payer: BLUE CROSS/BLUE SHIELD | Admitting: Family Medicine

## 2016-12-28 VITALS — BP 114/82 | HR 72 | Temp 98.3°F | Ht 66.5 in | Wt 145.1 lb

## 2016-12-28 DIAGNOSIS — R3915 Urgency of urination: Secondary | ICD-10-CM

## 2016-12-28 LAB — POCT URINALYSIS DIPSTICK
Bilirubin, UA: NEGATIVE
Blood, UA: NEGATIVE
Glucose, UA: NEGATIVE
Ketones, UA: NEGATIVE
Leukocytes, UA: NEGATIVE
Nitrite, UA: NEGATIVE
Protein, UA: NEGATIVE
Spec Grav, UA: 1.015 (ref 1.010–1.025)
Urobilinogen, UA: 0.2 E.U./dL
pH, UA: 6 (ref 5.0–8.0)

## 2016-12-28 MED ORDER — SULFAMETHOXAZOLE-TRIMETHOPRIM 800-160 MG PO TABS: 1.0000 | ORAL_TABLET | Freq: Two times a day (BID) | ORAL | 0 refills | Status: DC

## 2016-12-28 NOTE — Progress Notes (Signed)
BP 114/82 (BP Location: Left Arm, Patient Position: Sitting, Cuff Size: Normal)   Pulse 72   Temp 98.3 F (36.8 C) (Oral)   Ht 5' 6.5" (1.689 m)   Wt 145 lb 1.9 oz (65.8 kg)   SpO2 99%   BMI 23.07 kg/m    CC: UTI Subjective:    Patient ID: Kelsey Simon, female    DOB: 05/24/1956, 61 y.o.   MRN: [id number]  HPI: Kelsey Simon is a 61 y.o. female presenting on 12/28/2016 for Urinary Tract Infection (x3 days, urgency, pressure, smelly urine, AZO does not alleviate)   Several day history of urinary urgency, pressure, foul smell, mild dysuria, mild back pain.   No fevers/chills, nausea, abd pain, flank pain, hematuria.   She does enjoy spicy foods.  No caffeine, sodas.  Hasn't tried cranberry yet.   Cat bite in May - treated with some antibiotic.  She has increased water intake. Also tried azo this morning.  Relevant past medical, surgical, family and social history reviewed and updated as indicated. Interim medical history since our last visit reviewed. Allergies and medications reviewed and updated. Outpatient Medications Prior to Visit  Medication Sig Dispense Refill  . dicyclomine (BENTYL) 20 MG tablet TAKE ONE TABLET EVERY 6 HOURS AS NEEDED 30 tablet 0  . triamterene-hydrochlorothiazide (MAXZIDE) 75-50 MG tablet Take 1 tablet by mouth daily. 90 tablet 3  . clonazePAM (KLONOPIN) 0.5 MG tablet TAKE ONE-HALF TABLET BY MOUTH AS NEEDED FOR SLEEP (Patient not taking: Reported on 12/28/2016) 30 tablet 1  . benzonatate (TESSALON) 200 MG capsule Take 1 capsule (200 mg total) by mouth 2 (two) times daily as needed for cough. 30 capsule 0  . HYDROcodone-homatropine (HYCODAN) 5-1.5 MG/5ML syrup Take 5 mLs by mouth every 8 (eight) hours as needed for cough. 120 mL 0   No facility-administered medications prior to visit.      Per HPI unless specifically indicated in ROS section below Review of Systems     Objective:    BP 114/82 (BP Location: Left Arm, Patient Position:  Sitting, Cuff Size: Normal)   Pulse 72   Temp 98.3 F (36.8 C) (Oral)   Ht 5' 6.5" (1.689 m)   Wt 145 lb 1.9 oz (65.8 kg)   SpO2 99%   BMI 23.07 kg/m   Wt Readings from Last 3 Encounters:  12/28/16 145 lb 1.9 oz (65.8 kg)  06/14/16 148 lb (67.1 kg)  04/09/16 145 lb 4 oz (65.9 kg)    Physical Exam  Constitutional: She appears well-developed and well-nourished. No distress.  Abdominal: Soft. Normal appearance and bowel sounds are normal. She exhibits no distension and no mass. There is no hepatosplenomegaly. There is no tenderness. There is no rigidity, no rebound, no guarding, no CVA tenderness and negative Murphy's sign.  Musculoskeletal: She exhibits no edema.  Nursing note and vitals reviewed.  Results for orders placed or performed in visit on 12/28/16  POCT urinalysis dipstick  Result Value Ref Range   Color, UA yellow    Clarity, UA clear    Glucose, UA neg    Bilirubin, UA neg    Ketones, UA neg    Spec Grav, UA 1.015 1.010 - 1.025   Blood, UA neg    pH, UA 6.0 5.0 - 8.0   Protein, UA neg    Urobilinogen, UA 0.2 0.2 or 1.0 E.U./dL   Nitrite, UA neg    Leukocytes, UA Negative Negative   Micro: WBC 1-5 RBC 0 Bact  tr  UCx sent    Assessment & Plan:   Problem List Items Addressed This Visit    Urgency of urination - Primary    UA reassuring, but good story for UTI. Will treat empirically with bactrim DS 1 tab bid x3d. Encouraged avoiding spicy foods while ongoing urinary symptoms. Continue to push fluids. Update if not improving with treatment. Pt agrees with plan.       Relevant Orders   POCT urinalysis dipstick (Completed)   Urine Culture       Follow up plan: Return if symptoms worsen or fail to improve.  Ria Bush, MD

## 2016-12-28 NOTE — Assessment & Plan Note (Signed)
UA reassuring, but good story for UTI. Will treat empirically with bactrim DS 1 tab bid x3d. Encouraged avoiding spicy foods while ongoing urinary symptoms. Continue to push fluids. Update if not improving with treatment. Pt agrees with plan.

## 2016-12-28 NOTE — Patient Instructions (Signed)
Urine test looking ok. Culture sent - may start bactrim twice daily for 3 days while we get urine culture results.  Continue to push fluids. Avoid bladder irritants (spicy foods) while you're having urinary symptoms. Let us know if not improving with treatment. Good to see you today, call us with questions.

## 2016-12-29 LAB — URINE CULTURE: Organism ID, Bacteria: NO GROWTH

## 2017-02-08 ENCOUNTER — Other Ambulatory Visit: Payer: Self-pay | Admitting: Family Medicine

## 2017-02-08 ENCOUNTER — Encounter: Payer: Self-pay | Admitting: Family Medicine

## 2017-02-08 MED ORDER — TRIAMTERENE-HCTZ 75-50 MG PO TABS: 1.0000 | ORAL_TABLET | Freq: Every day | ORAL | 0 refills | Status: DC

## 2017-02-08 NOTE — Telephone Encounter (Signed)
Pt left v/m requesting status of triamterene HCTZ to [facility]; refill already done and pt voiced understanding; pt has already scheduled appt.

## 2017-02-09 NOTE — Telephone Encounter (Signed)
Noted  

## 2017-02-14 ENCOUNTER — Other Ambulatory Visit: Payer: Self-pay | Admitting: Family Medicine

## 2017-02-14 DIAGNOSIS — Z01419 Encounter for gynecological examination (general) (routine) without abnormal findings: Secondary | ICD-10-CM | POA: Insufficient documentation

## 2017-02-25 ENCOUNTER — Other Ambulatory Visit (INDEPENDENT_AMBULATORY_CARE_PROVIDER_SITE_OTHER): Payer: BLUE CROSS/BLUE SHIELD

## 2017-02-25 DIAGNOSIS — Z01419 Encounter for gynecological examination (general) (routine) without abnormal findings: Secondary | ICD-10-CM | POA: Diagnosis not present

## 2017-02-25 LAB — COMPREHENSIVE METABOLIC PANEL
ALT: 18 U/L (ref 0–35)
AST: 18 U/L (ref 0–37)
Albumin: 4.3 g/dL (ref 3.5–5.2)
Alkaline Phosphatase: 37 U/L — ABNORMAL LOW (ref 39–117)
BUN: 12 mg/dL (ref 6–23)
CO2: 30 mEq/L (ref 19–32)
Calcium: 9.6 mg/dL (ref 8.4–10.5)
Chloride: 97 mEq/L (ref 96–112)
Creatinine, Ser: 0.82 mg/dL (ref 0.40–1.20)
GFR: 74.85 mL/min (ref >60.00)
Glucose, Bld: 96 mg/dL (ref 70–99)
Potassium: 4 mEq/L (ref 3.5–5.1)
Sodium: 134 mEq/L — ABNORMAL LOW (ref 135–145)
Total Bilirubin: 0.6 mg/dL (ref 0.2–1.2)
Total Protein: 6.7 g/dL (ref 6.0–8.3)

## 2017-02-25 LAB — CBC WITH DIFFERENTIAL/PLATELET
Basophils Absolute: 0 10*3/uL (ref 0.0–0.1)
Basophils Relative: 0.8 % (ref 0.0–3.0)
Eosinophils Absolute: 0.1 10*3/uL (ref 0.0–0.7)
Eosinophils Relative: 1.4 % (ref 0.0–5.0)
HCT: 42.5 % (ref 36.0–46.0)
Hemoglobin: 14.3 g/dL (ref 12.0–15.0)
Lymphocytes Relative: 34.2 % (ref 12.0–46.0)
Lymphs Abs: 1.4 10*3/uL (ref 0.7–4.0)
MCHC: 33.7 g/dL (ref 30.0–36.0)
MCV: 84.6 fl (ref 78.0–100.0)
Monocytes Absolute: 0.3 10*3/uL (ref 0.1–1.0)
Monocytes Relative: 8 % (ref 3.0–12.0)
Neutro Abs: 2.2 10*3/uL (ref 1.4–7.7)
Neutrophils Relative %: 55.6 % (ref 43.0–77.0)
Platelets: 315 10*3/uL (ref 150.0–400.0)
RBC: 5.02 Mil/uL (ref 3.87–5.11)
RDW: 12.1 % (ref 11.5–15.5)
WBC: 4 10*3/uL (ref 4.0–10.5)

## 2017-02-25 LAB — LIPID PANEL
Cholesterol: 202 mg/dL — ABNORMAL HIGH (ref 0–200)
HDL: 79.6 mg/dL (ref >39.00)
LDL Cholesterol: 111 mg/dL — ABNORMAL HIGH (ref 0–99)
NonHDL: 122.02
Total CHOL/HDL Ratio: 3
Triglycerides: 53 mg/dL (ref 0.0–149.0)
VLDL: 10.6 mg/dL (ref 0.0–40.0)

## 2017-02-25 LAB — TSH: TSH: 1.18 u[IU]/mL (ref 0.35–4.50)

## 2017-02-28 ENCOUNTER — Other Ambulatory Visit (HOSPITAL_COMMUNITY)
Admission: RE | Admit: 2017-02-28 | Discharge: 2017-02-28 | Disposition: A | Discharge status: Home / Self Care | Payer: BLUE CROSS/BLUE SHIELD | Source: Ambulatory Visit | Attending: Family Medicine | Admitting: Family Medicine

## 2017-02-28 ENCOUNTER — Ambulatory Visit (INDEPENDENT_AMBULATORY_CARE_PROVIDER_SITE_OTHER): Payer: BLUE CROSS/BLUE SHIELD | Admitting: Family Medicine

## 2017-02-28 ENCOUNTER — Encounter: Payer: Self-pay | Admitting: Family Medicine

## 2017-02-28 DIAGNOSIS — Z01419 Encounter for gynecological examination (general) (routine) without abnormal findings: Secondary | ICD-10-CM | POA: Insufficient documentation

## 2017-02-28 DIAGNOSIS — I10 Essential (primary) hypertension: Secondary | ICD-10-CM

## 2017-02-28 DIAGNOSIS — F419 Anxiety disorder, unspecified: Secondary | ICD-10-CM | POA: Diagnosis not present

## 2017-02-28 DIAGNOSIS — Z23 Encounter for immunization: Secondary | ICD-10-CM

## 2017-02-28 MED ORDER — CYCLOBENZAPRINE HCL 5 MG PO TABS: 5.0000 mg | ORAL_TABLET | Freq: Three times a day (TID) | ORAL | 1 refills | Status: DC | PRN

## 2017-02-28 NOTE — Assessment & Plan Note (Signed)
Reviewed preventive care protocols, scheduled due services, and updated immunizations Discussed nutrition, exercise, diet, and healthy lifestyle.  Pap smear done today. Influenza vaccine given today.

## 2017-02-28 NOTE — Assessment & Plan Note (Signed)
Has been under good control- pt rushed to get her, compliant with rxs and asymptomatic. No changes made today.

## 2017-02-28 NOTE — Addendum Note (Signed)
Addended by: Johna Sheriff on: 02/28/2017 04:41 PM   Modules accepted: Orders

## 2017-02-28 NOTE — Progress Notes (Signed)
Subjective:   Patient ID: Kelsey Simon, female    DOB: [birth year], 61 y.o.   MRN: [id number]  Kelsey Simon is a pleasant 61 y.o. year old female who presents to clinic today with Annual Exam  on 02/28/2017  HPI:  Well woman-  Health Maintenance Due  Topic Date Due  . Hepatitis C Screening  1956/03/17  . HIV Screening  03/18/1969   Mammogram 11/12/16  Colonoscopy 01/03/08  HTN- Denies any HA, blurred vision, CP or SOB. Has been on current dose of Maxzide for years. BP elevated today but rushed to get here.  BP at work has been ranging in 270W-237S systolic.  Lab Results  Component Value Date   CREATININE 0.82 02/25/2017    Anxiety- she is doing very well.  Denies any symptoms of anxiety or depression.  Using Klonopin occasionally for insomnia.  Current Outpatient Prescriptions on File Prior to Visit  Medication Sig Dispense Refill  . clonazePAM (KLONOPIN) 0.5 MG tablet TAKE ONE-HALF TABLET BY MOUTH AS NEEDED FOR SLEEP 30 tablet 1  . dicyclomine (BENTYL) 20 MG tablet TAKE ONE TABLET EVERY 6 HOURS AS NEEDED 30 tablet 0  . triamterene-hydrochlorothiazide (MAXZIDE) 75-50 MG tablet Take 1 tablet by mouth daily. 90 tablet 0   No current facility-administered medications on file prior to visit.     Allergies  Allergen Reactions  . Penicillins Rash    REACTION: rash    Past Medical History:  Diagnosis Date  . Hypertension   . IBS (irritable bowel syndrome)   . PONV (postoperative nausea and vomiting)   . Varicose veins     Past Surgical History:  Procedure Laterality Date  . BREAST BIOPSY Right 2017 and 2006   x 2  . BREAST LUMPECTOMY WITH RADIOACTIVE SEED LOCALIZATION Right 02/13/2016   Procedure: RIGHT BREAST LUMPECTOMY WITH RADIOACTIVE SEED LOCALIZATION;  Surgeon: Autumn Messing III, MD;  Location: [place];  Service: General;  Laterality: Right;  . NASAL SEPTUM SURGERY  1990  . WISDOM TOOTH EXTRACTION      Family History  Problem  Relation Age of Onset  . Heart attack Father   . Lung cancer Father   . Drug abuse Daughter   . Colon cancer Paternal Grandfather   . Stomach cancer Neg Hx   . Esophageal cancer Neg Hx   . Rectal cancer Neg Hx   . Liver cancer Neg Hx   . Breast cancer Neg Hx     Social History   Social History  . Marital status: Married    Spouse name: N/A  . Number of children: 1  . Years of education: N/A   Occupational History  . Manager    Social History Main Topics  . Smoking status: Former Research scientist (life sciences)  . Smokeless tobacco: Never Used     Comment: quit smoking 25 years ago  . Alcohol use Yes     Comment: wine daily  . Drug use: No  . Sexual activity: Yes   Other Topics Concern  . Not on file   Social History Narrative  . No narrative on file   The PMH, PSH, Social History, Family History, Medications, and allergies have been reviewed in [facility], and have been updated if relevant.    Review of Systems  Constitutional: Negative.   HENT: Negative.   Eyes: Negative.   Respiratory: Negative.   Cardiovascular: Negative.   Gastrointestinal: Negative.   Endocrine: Negative.   Genitourinary: Negative.   Musculoskeletal: Negative.  Skin: Negative.   Allergic/Immunologic: Negative.   Neurological: Negative.   Hematological: Negative.   Psychiatric/Behavioral: Negative.   All other systems reviewed and are negative.      Objective:    BP (!) 160/90 (BP Location: Left Arm, Patient Position: Sitting, Cuff Size: Normal)   Pulse 82   Wt 145 lb 12 oz (66.1 kg)   SpO2 98%   BMI 23.17 kg/m    Physical Exam   General:  Well-developed,well-nourished,in no acute distress; alert,appropriate and cooperative throughout examination Head:  normocephalic and atraumatic.   Eyes:  vision grossly intact, PERRL Ears:  R ear normal and L ear normal externally, TMs clear bilaterally Nose:  no external deformity.   Mouth:  good dentition.   Neck:  No deformities, masses, or tenderness  noted. Breasts:  No mass, nodules, thickening, tenderness, bulging, retraction, inflamation, nipple discharge or skin changes noted.   Lungs:  Normal respiratory effort, chest expands symmetrically. Lungs are clear to auscultation, no crackles or wheezes. Heart:  Normal rate and regular rhythm. S1 and S2 normal without gallop, murmur, click, rub or other extra sounds. Abdomen:  Bowel sounds positive,abdomen soft and non-tender without masses, organomegaly or hernias noted. Rectal:  no external abnormalities.   Genitalia:  Pelvic Exam:        External: normal female genitalia without lesions or masses        Vagina: normal without lesions or masses        Cervix: normal without lesions or masses        Adnexa: normal bimanual exam without masses or fullness        Uterus: normal by palpation        Pap smear: performed Msk:  No deformity or scoliosis noted of thoracic or lumbar spine.   Extremities:  No clubbing, cyanosis, edema, or deformity noted with normal full range of motion of all joints.   Neurologic:  alert & oriented X3 and gait normal.   Skin:  Intact without suspicious lesions or rashes Cervical Nodes:  No lymphadenopathy noted Axillary Nodes:  No palpable lymphadenopathy Psych:  Cognition and judgment appear intact. Alert and cooperative with normal attention span and concentration. No apparent delusions, illusions, hallucinations       Assessment & Plan:   Well woman exam  Essential hypertension  Anxiety  Need for immunization against influenza - Plan: Flu Vaccine QUAD 36+ mos IM No Follow-up on file.

## 2017-02-28 NOTE — Addendum Note (Signed)
Addended by: Johna Sheriff on: 02/28/2017 04:01 PM   Modules accepted: Orders

## 2017-03-03 LAB — CYTOLOGY - PAP
Diagnosis: NEGATIVE
HPV: NOT DETECTED

## 2017-05-07 ENCOUNTER — Other Ambulatory Visit: Payer: Self-pay | Admitting: Family Medicine

## 2017-08-19 ENCOUNTER — Encounter: Payer: Self-pay | Admitting: Family Medicine

## 2017-08-19 ENCOUNTER — Other Ambulatory Visit: Payer: Self-pay | Admitting: Family Medicine

## 2017-08-19 MED ORDER — DICYCLOMINE HCL 20 MG PO TABS: ORAL_TABLET | ORAL | 0 refills | Status: DC

## 2017-08-19 NOTE — Telephone Encounter (Signed)
Last filled 11/2016... Please advise

## 2017-08-21 ENCOUNTER — Encounter: Payer: Self-pay | Admitting: Family Medicine

## 2017-08-22 ENCOUNTER — Other Ambulatory Visit: Payer: Self-pay

## 2017-08-22 ENCOUNTER — Telehealth: Payer: Self-pay | Admitting: Family Medicine

## 2017-08-22 MED ORDER — CLONAZEPAM 0.5 MG PO TABS: ORAL_TABLET | ORAL | 2 refills | Status: DC

## 2017-08-22 NOTE — Telephone Encounter (Signed)
Faxed signed Rx  To the pharm/thx dmf

## 2017-08-22 NOTE — Telephone Encounter (Signed)
Copied from [place] [id number]. Topic: Quick Communication - See Telephone Encounter >> Aug 22, 2017  2:21 PM Bea Graff, NT wrote: CRM for notification. See Telephone encounter for: 08/22/17. [place] is calling to see if the instructions on the clonazePAM [facility]) can state to take one 1/2 tablet at bedtime instead of "to sleep" so that it will tie ends up instead of sounding like she can take it anytime when she wants to sleep. CB#: [phone]

## 2017-08-29 DIAGNOSIS — H524 Presbyopia: Secondary | ICD-10-CM | POA: Diagnosis not present

## 2017-10-12 ENCOUNTER — Other Ambulatory Visit: Payer: Self-pay | Admitting: Family Medicine

## 2017-10-12 DIAGNOSIS — Z1231 Encounter for screening mammogram for malignant neoplasm of breast: Secondary | ICD-10-CM

## 2017-10-15 DIAGNOSIS — H5213 Myopia, bilateral: Secondary | ICD-10-CM | POA: Diagnosis not present

## 2017-11-18 ENCOUNTER — Ambulatory Visit
Admission: RE | Admit: 2017-11-18 | Discharge: 2017-11-18 | Disposition: A | Discharge status: Home / Self Care | Payer: BLUE CROSS/BLUE SHIELD | Source: Ambulatory Visit | Attending: Family Medicine | Admitting: Family Medicine

## 2017-11-18 DIAGNOSIS — Z1231 Encounter for screening mammogram for malignant neoplasm of breast: Secondary | ICD-10-CM

## 2018-02-11 ENCOUNTER — Other Ambulatory Visit: Payer: Self-pay | Admitting: Family Medicine

## 2018-02-11 ENCOUNTER — Encounter: Payer: Self-pay | Admitting: Family Medicine

## 2018-02-13 ENCOUNTER — Other Ambulatory Visit: Payer: Self-pay

## 2018-02-13 MED ORDER — TRIAMTERENE-HCTZ 75-50 MG PO TABS: 1.0000 | ORAL_TABLET | Freq: Every day | ORAL | 0 refills | Status: DC

## 2018-02-21 ENCOUNTER — Ambulatory Visit: Payer: BLUE CROSS/BLUE SHIELD | Admitting: Family Medicine

## 2018-02-27 ENCOUNTER — Ambulatory Visit: Payer: BLUE CROSS/BLUE SHIELD | Admitting: Family Medicine

## 2018-02-27 ENCOUNTER — Encounter: Payer: Self-pay | Admitting: Family Medicine

## 2018-02-27 VITALS — BP 138/86 | HR 71 | Temp 98.2°F | Ht 66.5 in | Wt 143.0 lb

## 2018-02-27 DIAGNOSIS — I10 Essential (primary) hypertension: Secondary | ICD-10-CM

## 2018-02-27 DIAGNOSIS — Z1159 Encounter for screening for other viral diseases: Secondary | ICD-10-CM

## 2018-02-27 DIAGNOSIS — Z114 Encounter for screening for human immunodeficiency virus [HIV]: Secondary | ICD-10-CM | POA: Diagnosis not present

## 2018-02-27 MED ORDER — TRIAMTERENE-HCTZ 75-50 MG PO TABS: 1.0000 | ORAL_TABLET | Freq: Every day | ORAL | 6 refills | Status: DC

## 2018-02-27 NOTE — Progress Notes (Signed)
Subjective:   Patient ID: Kelsey Simon, female    DOB: [birth year], 62 y.o.   MRN: [id number]  Kelsey Simon is a pleasant 62 y.o. year old female who presents to clinic today with Follow-up (Patient is here today to F/U with HTN.  She currently takes Triamterene-HCTZ 75/50mg  and is compliant.  She does not have any outside readings with her.  She got the flu shot 9.17.19. She agrees to Hep-C and HIV labs drawn.)  on 02/27/2018  HPI:  HTN- has been well controlled on Maxzide 75-50 mg daily. Lab Results  Component Value Date   CREATININE 0.82 02/25/2017   Lab Results  Component Value Date   NA 134 (L) 02/25/2017   K 4.0 02/25/2017   CL 97 02/25/2017   CO2 30 02/25/2017      Current Outpatient Medications on File Prior to Visit  Medication Sig Dispense Refill  . clonazePAM (KLONOPIN) 0.5 MG tablet TAKE ONE-HALF TABLET BY MOUTH AS NEEDED FOR SLEEP 30 tablet 2  . cyclobenzaprine (FLEXERIL) 5 MG tablet Take 1 tablet (5 mg total) by mouth 3 (three) times daily as needed for muscle spasms. 20 tablet 1  . dicyclomine (BENTYL) 20 MG tablet TAKE ONE TABLET EVERY 6 HOURS AS NEEDED 30 tablet 0  . triamterene-hydrochlorothiazide (MAXZIDE) 75-50 MG tablet Take 1 tablet by mouth daily. 30 tablet 0   No current facility-administered medications on file prior to visit.     Allergies  Allergen Reactions  . Penicillins Rash    REACTION: rash    Past Medical History:  Diagnosis Date  . Hypertension   . IBS (irritable bowel syndrome)   . PONV (postoperative nausea and vomiting)   . Varicose veins     Past Surgical History:  Procedure Laterality Date  . BREAST BIOPSY Right 2017 and 2006   x 2  . BREAST EXCISIONAL BIOPSY    . BREAST LUMPECTOMY WITH RADIOACTIVE SEED LOCALIZATION Right 02/13/2016   Procedure: RIGHT BREAST LUMPECTOMY WITH RADIOACTIVE SEED LOCALIZATION;  Surgeon: Autumn Messing III, MD;  Location: [place];  Service: General;  Laterality: Right;  . NASAL  SEPTUM SURGERY  1990  . WISDOM TOOTH EXTRACTION      Family History  Problem Relation Age of Onset  . Heart attack Father   . Lung cancer Father   . Drug abuse Daughter   . Colon cancer Paternal Grandfather   . Stomach cancer Neg Hx   . Esophageal cancer Neg Hx   . Rectal cancer Neg Hx   . Liver cancer Neg Hx   . Breast cancer Neg Hx     Social History   Socioeconomic History  . Marital status: Married    Spouse name: Not on file  . Number of children: 1  . Years of education: Not on file  . Highest education level: Not on file  Occupational History  . Occupation: Freight forwarder  Social Needs  . Financial resource strain: Not on file  . Food insecurity:    Worry: Not on file    Inability: Not on file  . Transportation needs:    Medical: Not on file    Non-medical: Not on file  Tobacco Use  . Smoking status: Former Research scientist (life sciences)  . Smokeless tobacco: Never Used  . Tobacco comment: quit smoking 25 years ago  Substance and Sexual Activity  . Alcohol use: Yes    Comment: wine daily  . Drug use: No  . Sexual activity: Yes  Lifestyle  .  Physical activity:    Days per week: Not on file    Minutes per session: Not on file  . Stress: Not on file  Relationships  . Social connections:    Talks on phone: Not on file    Gets together: Not on file    Attends religious service: Not on file    Active member of club or organization: Not on file    Attends meetings of clubs or organizations: Not on file    Relationship status: Not on file  . Intimate partner violence:    Fear of current or ex partner: Not on file    Emotionally abused: Not on file    Physically abused: Not on file    Forced sexual activity: Not on file  Other Topics Concern  . Not on file  Social History Narrative  . Not on file   The PMH, PSH, Social History, Family History, Medications, and allergies have been reviewed in [facility], and have been updated if relevant.   Review of Systems  Constitutional: Negative.     HENT: Negative.   Respiratory: Negative.   Cardiovascular: Negative.   Gastrointestinal: Negative.   Musculoskeletal: Negative.   Neurological: Negative.   Hematological: Negative.   Psychiatric/Behavioral: Negative.   All other systems reviewed and are negative.      Objective:    BP 138/86 (BP Location: Left Arm, Patient Position: Sitting, Cuff Size: Normal)   Pulse 71   Temp 98.2 F (36.8 C) (Oral)   Ht 5' 6.5" (1.689 m)   Wt 143 lb (64.9 kg)   SpO2 98%   BMI 22.74 kg/m    Physical Exam  Constitutional: She is oriented to person, place, and time. She appears well-developed and well-nourished. No distress.  HENT:  Head: Normocephalic and atraumatic.  Eyes: EOM are normal.  Neck: Normal range of motion.  Cardiovascular: Normal rate and regular rhythm.  Pulmonary/Chest: Effort normal and breath sounds normal.  Musculoskeletal: Normal range of motion. She exhibits no edema.  Neurological: She is alert and oriented to person, place, and time. No cranial nerve deficit.  Skin: Skin is warm and dry. She is not diaphoretic.  Psychiatric: She has a normal mood and affect. Her behavior is normal. Judgment and thought content normal.  Nursing note and vitals reviewed.         Assessment & Plan:   Essential hypertension - Plan: TSH, Lipid panel, Comprehensive metabolic panel  Need for hepatitis C screening test - Plan: Hepatitis C Antibody No follow-ups on file.

## 2018-02-27 NOTE — Patient Instructions (Signed)
Great to see you. I will call you with your lab results from today and you can view them online.   

## 2018-02-27 NOTE — Assessment & Plan Note (Signed)
Well controlled. No changes made to rx. Labs today. Orders Placed This Encounter  Procedures  . Hepatitis C Antibody  . TSH  . Lipid panel  . Comprehensive metabolic panel  . HIV antibody (with reflex)

## 2018-02-28 LAB — COMPREHENSIVE METABOLIC PANEL
ALT: 19 U/L (ref 0–35)
AST: 23 U/L (ref 0–37)
Albumin: 4.1 g/dL (ref 3.5–5.2)
Alkaline Phosphatase: 44 U/L (ref 39–117)
BUN: 13 mg/dL (ref 6–23)
CO2: 30 mEq/L (ref 19–32)
Calcium: 9.3 mg/dL (ref 8.4–10.5)
Chloride: 98 mEq/L (ref 96–112)
Creatinine, Ser: 0.76 mg/dL (ref 0.40–1.20)
GFR: 81.45 mL/min (ref >60.00)
Glucose, Bld: 87 mg/dL (ref 70–99)
Potassium: 3.5 mEq/L (ref 3.5–5.1)
Sodium: 136 mEq/L (ref 135–145)
Total Bilirubin: 0.4 mg/dL (ref 0.2–1.2)
Total Protein: 6.6 g/dL (ref 6.0–8.3)

## 2018-02-28 LAB — LIPID PANEL
Cholesterol: 185 mg/dL (ref 0–200)
HDL: 79.8 mg/dL (ref >39.00)
LDL Cholesterol: 90 mg/dL (ref 0–99)
NonHDL: 104.83
Total CHOL/HDL Ratio: 2
Triglycerides: 75 mg/dL (ref 0.0–149.0)
VLDL: 15 mg/dL (ref 0.0–40.0)

## 2018-02-28 LAB — HIV ANTIBODY (ROUTINE TESTING W REFLEX): HIV 1&2 Ab, 4th Generation: NONREACTIVE

## 2018-02-28 LAB — TSH: TSH: 1.1 u[IU]/mL (ref 0.35–4.50)

## 2018-02-28 LAB — HEPATITIS C ANTIBODY
Hepatitis C Ab: NONREACTIVE
SIGNAL TO CUT-OFF: 0.01 (ref <1.00)

## 2018-03-21 ENCOUNTER — Encounter: Payer: Self-pay | Admitting: Internal Medicine

## 2018-03-21 ENCOUNTER — Other Ambulatory Visit: Payer: Self-pay | Admitting: Family Medicine

## 2018-03-21 MED ORDER — DICYCLOMINE HCL 20 MG PO TABS: ORAL_TABLET | ORAL | 0 refills | Status: DC

## 2018-04-03 ENCOUNTER — Encounter: Payer: Self-pay | Admitting: Family Medicine

## 2018-04-03 ENCOUNTER — Ambulatory Visit: Payer: BLUE CROSS/BLUE SHIELD | Admitting: Family Medicine

## 2018-04-03 VITALS — BP 130/92 | HR 84 | Temp 98.0°F | Ht 66.5 in | Wt 142.0 lb

## 2018-04-03 DIAGNOSIS — R350 Frequency of micturition: Secondary | ICD-10-CM | POA: Diagnosis not present

## 2018-04-03 LAB — POCT URINALYSIS DIPSTICK
Blood, UA: NEGATIVE
Glucose, UA: NEGATIVE
Ketones, UA: NEGATIVE
Nitrite, UA: POSITIVE
Protein, UA: NEGATIVE
Spec Grav, UA: 1.015 (ref 1.010–1.025)
Urobilinogen, UA: 2 E.U./dL — AB
pH, UA: 6.5 (ref 5.0–8.0)

## 2018-04-03 MED ORDER — SULFAMETHOXAZOLE-TRIMETHOPRIM 800-160 MG PO TABS: 1.0000 | ORAL_TABLET | Freq: Two times a day (BID) | ORAL | 0 refills | Status: DC

## 2018-04-03 NOTE — Patient Instructions (Signed)
Nice to meet you  Please try the medicine  Please follow up if your symptoms don't improve  I will call you with the results from today.

## 2018-04-03 NOTE — Progress Notes (Signed)
Kelsey Simon - 62 y.o. female MRN [id number]  Date of birth: [birth year]  SUBJECTIVE:  Including CC & ROS.  Chief Complaint  Patient presents with  . Urinary Frequency    pt c/o frequent urination for x2 days, pt also having burning while urinating. pt c/o pressure and low back ache. Pt has taken [place] is a 62 y.o. female that is presenting with urinary frequency and dysuria.  Symptoms been going on for couple of days.  Having some flank pain but no fevers.  Denies any possible sexual transmitted disease.  Has a history of a urinary tract infection a couple of years ago that grew E. coli.  Symptoms got progressively worse over the past few days.  Has been taking Azo to help with the pain.   Review of Systems  Constitutional: Negative for fever.  HENT: Negative for congestion.   Genitourinary: Positive for dysuria, flank pain and frequency.  Musculoskeletal: Negative for gait problem.  Skin: Negative for color change.  Psychiatric/Behavioral: Negative for agitation.    HISTORY: Past Medical, Surgical, Social, and Family History Reviewed & Updated per EMR.   Pertinent Historical Findings include:  Past Medical History:  Diagnosis Date  . Hypertension   . IBS (irritable bowel syndrome)   . PONV (postoperative nausea and vomiting)   . Varicose veins     Past Surgical History:  Procedure Laterality Date  . BREAST BIOPSY Right 2017 and 2006   x 2  . BREAST EXCISIONAL BIOPSY    . BREAST LUMPECTOMY WITH RADIOACTIVE SEED LOCALIZATION Right 02/13/2016   Procedure: RIGHT BREAST LUMPECTOMY WITH RADIOACTIVE SEED LOCALIZATION;  Surgeon: Autumn Messing III, MD;  Location: [place];  Service: General;  Laterality: Right;  . NASAL SEPTUM SURGERY  1990  . WISDOM TOOTH EXTRACTION      Allergies  Allergen Reactions  . Penicillins Rash    REACTION: rash    Family History  Problem Relation Age of Onset  . Heart attack Father   . Lung cancer Father   .  Drug abuse Daughter   . Colon cancer Paternal Grandfather   . Stomach cancer Neg Hx   . Esophageal cancer Neg Hx   . Rectal cancer Neg Hx   . Liver cancer Neg Hx   . Breast cancer Neg Hx      Social History   Socioeconomic History  . Marital status: Married    Spouse name: Not on file  . Number of children: 1  . Years of education: Not on file  . Highest education level: Not on file  Occupational History  . Occupation: Freight forwarder  Social Needs  . Financial resource strain: Not on file  . Food insecurity:    Worry: Not on file    Inability: Not on file  . Transportation needs:    Medical: Not on file    Non-medical: Not on file  Tobacco Use  . Smoking status: Former Research scientist (life sciences)  . Smokeless tobacco: Never Used  . Tobacco comment: quit smoking 25 years ago  Substance and Sexual Activity  . Alcohol use: Yes    Comment: wine daily  . Drug use: No  . Sexual activity: Yes  Lifestyle  . Physical activity:    Days per week: Not on file    Minutes per session: Not on file  . Stress: Not on file  Relationships  . Social connections:    Talks on phone: Not on file  Gets together: Not on file    Attends religious service: Not on file    Active member of club or organization: Not on file    Attends meetings of clubs or organizations: Not on file    Relationship status: Not on file  . Intimate partner violence:    Fear of current or ex partner: Not on file    Emotionally abused: Not on file    Physically abused: Not on file    Forced sexual activity: Not on file  Other Topics Concern  . Not on file  Social History Narrative  . Not on file     PHYSICAL EXAM:  VS: BP (!) 130/92 (BP Location: Right Arm, Patient Position: Sitting, Cuff Size: Normal)   Pulse 84   Temp 98 F (36.7 C) (Oral)   Ht 5' 6.5" (1.689 m)   Wt 142 lb (64.4 kg)   SpO2 97%   BMI 22.58 kg/m  Physical Exam Gen: NAD, alert, cooperative with exam, well-appearing ENT: normal lips, normal nasal mucosa,   Eye: normal EOM, normal conjunctiva and lids CV:  no edema, +2 pedal pulses   Resp: no accessory muscle use, non-labored,  GI: no masses, mild supraspubic tenderness, no hernia  Skin: no rashes, no areas of induration  Neuro: normal tone, normal sensation to touch Psych:  normal insight, alert and oriented MSK: normal gait, normal strength, negative CVA tenderness.      ASSESSMENT & PLAN:   Frequent urination Dipstick suggestive of UTI. No fevers but does have flank pain  - bactrim  - urine culture  - counseled on supportive care

## 2018-04-03 NOTE — Assessment & Plan Note (Signed)
Dipstick suggestive of UTI. No fevers but does have flank pain  - bactrim  - urine culture  - counseled on supportive care

## 2018-04-04 LAB — URINE CULTURE
MICRO NUMBER:: 91323944
Result:: NO GROWTH
SPECIMEN QUALITY:: ADEQUATE

## 2018-04-05 ENCOUNTER — Telehealth: Payer: Self-pay | Admitting: Family Medicine

## 2018-04-05 NOTE — Telephone Encounter (Signed)
Left VM for patient. If she calls back please have her speak with a nurse/CMA and inform that her urine culture didn't grow a bacteria. The PEC can report results to patient.   If any questions then please take the best time and phone number to call and I will try to call her back.   Rosemarie Ax, MD [facility] 04/05/2018, 8:19 AM

## 2018-04-05 NOTE — Telephone Encounter (Signed)
Results/message from Dr. Raeford Razor read to patient. Pt has additional questions and would like a CB from Dr. Raeford Razor. States "Anytime"  # [phone].

## 2018-04-05 NOTE — Telephone Encounter (Signed)
Attempted to call. Unable to reach.   Rosemarie Ax, MD [facility] 04/05/2018, 10:38 AM

## 2018-04-07 ENCOUNTER — Encounter: Payer: Self-pay | Admitting: Family Medicine

## 2018-04-21 ENCOUNTER — Ambulatory Visit (AMBULATORY_SURGERY_CENTER): Payer: Self-pay | Admitting: *Deleted

## 2018-04-21 VITALS — Ht 67.0 in | Wt 139.0 lb

## 2018-04-21 DIAGNOSIS — Z1211 Encounter for screening for malignant neoplasm of colon: Secondary | ICD-10-CM

## 2018-04-21 NOTE — Progress Notes (Signed)
No egg or soy allergy known to patient  No issues with past sedation with any surgeries  or procedures, no intubation problems  No diet pills per patient No home 02 use per patient  No blood thinners per patient  Pt denies issues with constipation -on magnesium daily  No A fib or A flutter  EMMI video sent to pt's e mail - pt declined

## 2018-04-26 ENCOUNTER — Encounter: Payer: Self-pay | Admitting: Internal Medicine

## 2018-05-12 ENCOUNTER — Ambulatory Visit (AMBULATORY_SURGERY_CENTER): Payer: BLUE CROSS/BLUE SHIELD | Admitting: Internal Medicine

## 2018-05-12 ENCOUNTER — Encounter: Payer: Self-pay | Admitting: Internal Medicine

## 2018-05-12 VITALS — BP 133/76 | HR 64 | Temp 97.5°F | Resp 10

## 2018-05-12 DIAGNOSIS — D120 Benign neoplasm of cecum: Secondary | ICD-10-CM

## 2018-05-12 DIAGNOSIS — Z1211 Encounter for screening for malignant neoplasm of colon: Secondary | ICD-10-CM

## 2018-05-12 DIAGNOSIS — Z86010 Personal history of colonic polyps: Secondary | ICD-10-CM | POA: Diagnosis not present

## 2018-05-12 MED ORDER — SODIUM CHLORIDE 0.9 % IV SOLN: 500.0000 mL | Freq: Once | INTRAVENOUS | Status: DC

## 2018-05-12 NOTE — Progress Notes (Signed)
Report given to PACU, vss 

## 2018-05-12 NOTE — Progress Notes (Signed)
Called to room to assist during endoscopic procedure.  Patient ID and intended procedure confirmed with present staff. Received instructions for my participation in the procedure from the performing physician.  

## 2018-05-12 NOTE — Progress Notes (Signed)
Pt's states no medical or surgical changes since previsit or office visit. 

## 2018-05-12 NOTE — Patient Instructions (Addendum)
I found and removed 2 tiny polyps.  I will let you know pathology results and when to have another routine colonoscopy by mail and/or My Chart.  You also have a condition called diverticulosis - common and not usually a problem. Please read the handout provided.  I appreciate the opportunity to care for you. Gatha Mayer, MD, [facility]     Handouts Provided:  Polyps and Diverticulosis  YOU HAD AN ENDOSCOPIC PROCEDURE TODAY AT [place]:   Refer to the procedure report that was given to you for any specific questions about what was found during the examination.  If the procedure report does not answer your questions, please call your gastroenterologist to clarify.  If you requested that your care partner not be given the details of your procedure findings, then the procedure report has been included in a sealed envelope for you to review at your convenience later.  YOU SHOULD EXPECT: Some feelings of bloating in the abdomen. Passage of more gas than usual.  Walking can help get rid of the air that was put into your GI tract during the procedure and reduce the bloating. If you had a lower endoscopy (such as a colonoscopy or flexible sigmoidoscopy) you may notice spotting of blood in your stool or on the toilet paper. If you underwent a bowel prep for your procedure, you may not have a normal bowel movement for a few days.  Please Note:  You might notice some irritation and congestion in your nose or some drainage.  This is from the oxygen used during your procedure.  There is no need for concern and it should clear up in a day or so.  SYMPTOMS TO REPORT IMMEDIATELY:   Following lower endoscopy (colonoscopy or flexible sigmoidoscopy):  Excessive amounts of blood in the stool  Significant tenderness or worsening of abdominal pains  Swelling of the abdomen that is new, acute  Fever of 100F or higher   For urgent or emergent issues, a gastroenterologist can be  reached at any hour by calling [phone].   DIET:  We do recommend a small meal at first, but then you may proceed to your regular diet.  Drink plenty of fluids but you should avoid alcoholic beverages for 24 hours.  ACTIVITY:  You should plan to take it easy for the rest of today and you should NOT DRIVE or use heavy machinery until tomorrow (because of the sedation medicines used during the test).    FOLLOW UP: Our staff will call the number listed on your records the next business day following your procedure to check on you and address any questions or concerns that you may have regarding the information given to you following your procedure. If we do not reach you, we will leave a message.  However, if you are feeling well and you are not experiencing any problems, there is no need to return our call.  We will assume that you have returned to your regular daily activities without incident.  If any biopsies were taken you will be contacted by phone or by letter within the next 1-3 weeks.  Please call us at [phone] if you have not heard about the biopsies in 3 weeks.    SIGNATURES/CONFIDENTIALITY: You and/or your care partner have signed paperwork which will be entered into your electronic medical record.  These signatures attest to the fact that that the information above on your After Visit Summary has been reviewed and is  understood.  Full responsibility of the confidentiality of this discharge information lies with you and/or your care-partner.

## 2018-05-12 NOTE — Op Note (Signed)
[facility] Patient Name: Kelsey Simon Procedure Date: 05/12/2018 11:12 AM MRN: [id number] Endoscopist: Gatha Mayer , MD Age: 62 Referring MD:  Date of Birth: 05/06/1956 Gender: Female Account #: [id number] Procedure:                Colonoscopy Indications:              Screening for colorectal malignant neoplasm, Last                            colonoscopy: 2009 Medicines:                Propofol per Anesthesia, Monitored Anesthesia Care Procedure:                Pre-Anesthesia Assessment:                           - Prior to the procedure, a History and Physical                            was performed, and patient medications and                            allergies were reviewed. The patient's tolerance of                            previous anesthesia was also reviewed. The risks                            and benefits of the procedure and the sedation                            options and risks were discussed with the patient.                            All questions were answered, and informed consent                            was obtained. Prior Anticoagulants: The patient has                            taken no previous anticoagulant or antiplatelet                            agents. ASA Grade Assessment: II - A patient with                            mild systemic disease. After reviewing the risks                            and benefits, the patient was deemed in                            satisfactory condition to undergo the procedure.  After obtaining informed consent, the colonoscope                            was passed under direct vision. Throughout the                            procedure, the patient's blood pressure, pulse, and                            oxygen saturations were monitored continuously. The                            Colonoscope was introduced through the anus and                            advanced to the  the cecum, identified by                            appendiceal orifice and ileocecal valve. The                            colonoscopy was performed without difficulty. The                            patient tolerated the procedure well. The quality                            of the bowel preparation was good. The ileocecal                            valve, appendiceal orifice, and rectum were                            photographed. The bowel preparation used was                            Miralax. Scope In: 11:29:10 AM Scope Out: 11:45:16 AM Scope Withdrawal Time: 0 hours 10 minutes 20 seconds  Total Procedure Duration: 0 hours 16 minutes 6 seconds  Findings:                 The perianal and digital rectal examinations were                            normal.                           Two sessile polyps were found in the cecum. The                            polyps were diminutive in size. These polyps were                            removed with a cold snare. Resection and retrieval  were complete. Verification of patient                            identification for the specimen was done. Estimated                            blood loss was minimal.                           Diverticula were found in the sigmoid colon and                            ascending colon.                           The exam was otherwise without abnormality on                            direct and retroflexion views. Complications:            No immediate complications. Estimated Blood Loss:     Estimated blood loss was minimal. Impression:               - Two diminutive polyps in the cecum, removed with                            a cold snare. Resected and retrieved.                           - Diverticulosis in the sigmoid colon and in the                            ascending colon.                           - The examination was otherwise normal on direct                             and retroflexion views. Recommendation:           - Patient has a contact number available for                            emergencies. The signs and symptoms of potential                            delayed complications were discussed with the                            patient. Return to normal activities tomorrow.                            Written discharge instructions were provided to the                            patient.                           -  Resume previous diet.                           - Continue present medications.                           - Repeat colonoscopy is recommended. The                            colonoscopy date will be determined after pathology                            results from today's exam become available for                            review. Gatha Mayer, MD 05/12/2018 11:50:44 AM This report has been signed electronically.

## 2018-05-15 ENCOUNTER — Telehealth: Payer: Self-pay

## 2018-05-15 NOTE — Telephone Encounter (Signed)
Left message on answering machine. 

## 2018-05-15 NOTE — Telephone Encounter (Signed)
  Follow up Call-  Call back number 05/12/2018  Post procedure Call Back phone  # [phone]  Permission to leave phone message Yes  Some recent data might be hidden     Patient questions:  Do you have a fever, pain , or abdominal swelling? No. Pain Score  0 *  Have you tolerated food without any problems? Yes.    Have you been able to return to your normal activities? Yes.    Do you have any questions about your discharge instructions: Diet   No. Medications  No. Follow up visit  No.  Do you have questions or concerns about your Care? No.  Actions: * If pain score is 4 or above: No action needed, pain <4.

## 2018-05-18 ENCOUNTER — Encounter: Payer: Self-pay | Admitting: Internal Medicine

## 2018-05-18 DIAGNOSIS — Z86010 Personal history of colonic polyps: Secondary | ICD-10-CM

## 2018-05-18 DIAGNOSIS — Z860101 Personal history of adenomatous and serrated colon polyps: Secondary | ICD-10-CM | POA: Insufficient documentation

## 2018-05-18 HISTORY — DX: Personal history of colonic polyps: Z86.010

## 2018-05-18 HISTORY — DX: Personal history of adenomatous and serrated colon polyps: Z86.0101

## 2018-05-18 NOTE — Progress Notes (Signed)
2 diminutive adenomas  7 year recall 2026-7

## 2018-05-26 ENCOUNTER — Telehealth: Payer: Self-pay | Admitting: Family Medicine

## 2018-05-26 MED ORDER — CLONAZEPAM 0.5 MG PO TABS: ORAL_TABLET | ORAL | 2 refills | Status: DC

## 2018-05-26 MED ORDER — CYCLOBENZAPRINE HCL 5 MG PO TABS: 5.0000 mg | ORAL_TABLET | Freq: Three times a day (TID) | ORAL | 1 refills | Status: DC | PRN

## 2018-05-26 MED ORDER — DICYCLOMINE HCL 20 MG PO TABS: ORAL_TABLET | ORAL | 0 refills | Status: DC

## 2018-05-26 NOTE — Telephone Encounter (Signed)
Okay to refill Bentyl, Clonazepam, and Flexeril?

## 2018-05-26 NOTE — Telephone Encounter (Signed)
Rxs sent

## 2018-05-26 NOTE — Telephone Encounter (Signed)
Copied from [place] [phone]. Topic: General - Other >> May 26, 2018  1:07 PM Lennox Solders wrote: Reason for CRM: [facility]. [facility] is requesting new rxs bentyl, clonazepam and flexeril

## 2018-05-26 NOTE — Telephone Encounter (Signed)
Yes okay to refill as requested.

## 2018-06-12 DIAGNOSIS — I8312 Varicose veins of left lower extremity with inflammation: Secondary | ICD-10-CM | POA: Diagnosis not present

## 2018-06-12 DIAGNOSIS — I8311 Varicose veins of right lower extremity with inflammation: Secondary | ICD-10-CM | POA: Diagnosis not present

## 2018-06-27 DIAGNOSIS — I87323 Chronic venous hypertension (idiopathic) with inflammation of bilateral lower extremity: Secondary | ICD-10-CM | POA: Diagnosis not present

## 2018-10-24 ENCOUNTER — Other Ambulatory Visit: Payer: Self-pay | Admitting: Family Medicine

## 2018-10-24 DIAGNOSIS — Z1231 Encounter for screening mammogram for malignant neoplasm of breast: Secondary | ICD-10-CM

## 2018-10-31 ENCOUNTER — Other Ambulatory Visit: Payer: Self-pay | Admitting: Family Medicine

## 2018-12-12 ENCOUNTER — Ambulatory Visit: Payer: BLUE CROSS/BLUE SHIELD

## 2019-01-16 ENCOUNTER — Other Ambulatory Visit: Payer: Self-pay | Admitting: Family Medicine

## 2019-01-16 ENCOUNTER — Other Ambulatory Visit: Payer: Self-pay

## 2019-01-16 ENCOUNTER — Ambulatory Visit
Admission: RE | Admit: 2019-01-16 | Discharge: 2019-01-16 | Disposition: A | Discharge status: Home / Self Care | Payer: BLUE CROSS/BLUE SHIELD | Source: Ambulatory Visit | Attending: Family Medicine | Admitting: Family Medicine

## 2019-01-16 DIAGNOSIS — Z1231 Encounter for screening mammogram for malignant neoplasm of breast: Secondary | ICD-10-CM

## 2019-01-16 DIAGNOSIS — R234 Changes in skin texture: Secondary | ICD-10-CM

## 2019-01-18 ENCOUNTER — Ambulatory Visit
Admission: RE | Admit: 2019-01-18 | Discharge: 2019-01-18 | Disposition: A | Discharge status: Home / Self Care | Payer: BC Managed Care – PPO | Source: Ambulatory Visit | Attending: Family Medicine | Admitting: Family Medicine

## 2019-01-18 ENCOUNTER — Other Ambulatory Visit: Payer: Self-pay | Admitting: Family Medicine

## 2019-01-18 ENCOUNTER — Other Ambulatory Visit: Payer: Self-pay

## 2019-01-18 DIAGNOSIS — R234 Changes in skin texture: Secondary | ICD-10-CM

## 2019-01-18 DIAGNOSIS — N6489 Other specified disorders of breast: Secondary | ICD-10-CM | POA: Diagnosis not present

## 2019-01-18 DIAGNOSIS — R928 Other abnormal and inconclusive findings on diagnostic imaging of breast: Secondary | ICD-10-CM | POA: Diagnosis not present

## 2019-01-23 ENCOUNTER — Other Ambulatory Visit: Payer: Self-pay | Admitting: Family Medicine

## 2019-05-22 ENCOUNTER — Other Ambulatory Visit: Payer: Self-pay | Admitting: Family Medicine

## 2019-05-28 NOTE — Telephone Encounter (Signed)
Left Vm to set up appt.

## 2019-06-12 NOTE — Progress Notes (Addendum)
Virtual Visit via Video   Due to the COVID-19 pandemic, this visit was completed with telemedicine (audio/video) technology to reduce patient and provider exposure as well as to preserve personal protective equipment.   I connected with Kelsey Simon by a video enabled telemedicine application and verified that I am speaking with the correct person using two identifiers. Location patient: Home Location provider: [facility], Office Persons participating in the virtual visit: JANIA GIEGERICH, Arnette Norris, MD   I discussed the limitations of evaluation and management by telemedicine and the availability of in person appointments. The patient expressed understanding and agreed to proceed.  Interactive audio and video telecommunications were attempted between this provider and patient, however failed, due to patient having technical difficulties OR patient did not have access to video capability.  We continued and completed visit with audio only.   Care Team   Patient Care Team: Lucille Passy, MD as PCP - General (Family Medicine)  Subjective:   HPI: Patient agrees to virtual visit. She is connecting to F/U with her medications.  She currently takes Maxzide 75/50mg  1qd for her HTN and is compliant.  Follow up, med refills.  HTN- has been well controlled on Maxzide 75-50 mg daily.   Review of Systems  Constitutional: Negative.  Negative for fever and malaise/fatigue.  HENT: Negative.  Negative for congestion and hearing loss.   Eyes: Negative.  Negative for blurred vision, discharge and redness.  Respiratory: Negative.  Negative for cough and shortness of breath.   Cardiovascular: Negative.  Negative for chest pain, palpitations and leg swelling.  Gastrointestinal: Negative.  Negative for abdominal pain and heartburn.  Genitourinary: Negative.  Negative for dysuria.  Musculoskeletal: Negative.  Negative for falls and myalgias.  Skin: Negative.  Negative for rash.    Neurological: Negative.  Negative for loss of consciousness and headaches.  Endo/Heme/Allergies: Negative.  Does not bruise/bleed easily.  Psychiatric/Behavioral: Negative.  Negative for depression and memory loss.  All other systems reviewed and are negative.    Patient Active Problem List   Diagnosis Date Noted  . Hx of adenomatous colonic polyps 05/18/2018  . Frequent urination 04/03/2018  . OAB (overactive bladder) 08/25/2012  . 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 Anxiety 01/04/2012  . Hypertension     Social History   Tobacco Use  . Smoking status: Former Research scientist (life sciences)  . Smokeless tobacco: Never Used  . Tobacco comment: quit smoking 25 years ago  Substance Use Topics  . Alcohol use: Yes    Comment: wine daily    Current Outpatient Medications:  .  BIOTIN PO, Take by mouth daily., Disp: , Rfl:  .  cholecalciferol (VITAMIN D3) 25 MCG (1000 UT) tablet, Take 1,000 Units by mouth daily., Disp: , Rfl:  .  clonazePAM (KLONOPIN) 0.5 MG tablet, TAKE ONE-HALF TABLET BY MOUTH AS NEEDED FOR SLEEP, Disp: 30 tablet, Rfl: 2 .  Cyanocobalamin (VITAMIN B-12) 6000 MCG SUBL, Place under the tongue., Disp: , Rfl:  .  dicyclomine (BENTYL) 20 MG tablet, TAKE ONE TABLET EVERY 6 HOURS AS NEEDED, Disp: 30 tablet, Rfl: 0 .  Magnesium 250 MG TABS, Take by mouth., Disp: , Rfl:  .  Multiple Vitamin (MULTIVITAMIN) tablet, Take 1 tablet by mouth daily., Disp: , Rfl:  .  triamterene-hydrochlorothiazide (MAXZIDE) 75-50 MG tablet, Take 1 tablet by mouth daily., Disp: 90 tablet, Rfl: 3  Allergies  Allergen Reactions  . Penicillins Rash    REACTION: rash    Objective:  There were  no vitals taken for this visit.  VITALS: Per patient if applicable, see vitals. GENERAL: Alert, appears well and in no acute distress. HEENT: Atraumatic, conjunctiva clear, no obvious abnormalities on inspection of external nose and ears. NECK: Normal movements of the head and neck. CARDIOPULMONARY: No increased WOB. Speaking  in clear sentences. I:E ratio WNL.  MS: Moves all visible extremities without noticeable abnormality. PSYCH: Pleasant and cooperative, well-groomed. Speech normal rate and rhythm. Affect is appropriate. Insight and judgement are appropriate. Attention is focused, linear, and appropriate.  NEURO: CN grossly intact. Oriented as arrived to appointment on time with no prompting. Moves both UE equally.  SKIN: No obvious lesions, wounds, erythema, or cyanosis noted on face or hands.  Depression screen PHQ 2/9 02/28/2017  Decreased Interest 0  Down, Depressed, Hopeless 0  PHQ - 2 Score 0  Altered sleeping 0  Tired, decreased energy 0  Change in appetite 0  Feeling bad or failure about yourself  0  Trouble concentrating 0  Moving slowly or fidgety/restless 0  Suicidal thoughts 0  PHQ-9 Score 0     . COVID-19 Education: The signs and symptoms of COVID-19 were discussed with the patient and how to seek care for testing if needed. The importance of social distancing was discussed today. . Reviewed expectations re: course of current medical issues. . Discussed self-management of symptoms. . Outlined signs and symptoms indicating need for more acute intervention. . Patient verbalized understanding and all questions were answered. Marland Kitchen Health Maintenance issues including appropriate healthy diet, exercise, and smoking avoidance were discussed with patient. . See orders for this visit as documented in the electronic medical record.  Arnette Norris, MD  Records requested if needed. Time spent: 15 minutes, of which >50% was spent in obtaining information about her symptoms, reviewing her previous labs, evaluations, and treatments, counseling her about her condition (please see the discussed topics above), and developing a plan to further investigate it; she had a number of questions which I addressed.   Lab Results  Component Value Date   WBC 4.0 02/25/2017   HGB 14.3 02/25/2017   HCT 42.5 02/25/2017    PLT 315.0 02/25/2017   GLUCOSE 87 02/27/2018   CHOL 185 02/27/2018   TRIG 75.0 02/27/2018   HDL 79.80 02/27/2018   LDLDIRECT 123.1 01/04/2012   LDLCALC 90 02/27/2018   ALT 19 02/27/2018   AST 23 02/27/2018   NA 136 02/27/2018   K 3.5 02/27/2018   CL 98 02/27/2018   CREATININE 0.76 02/27/2018   BUN 13 02/27/2018   CO2 30 02/27/2018   TSH 1.10 02/27/2018    Lab Results  Component Value Date   TSH 1.10 02/27/2018   Lab Results  Component Value Date   WBC 4.0 02/25/2017   HGB 14.3 02/25/2017   HCT 42.5 02/25/2017   MCV 84.6 02/25/2017   PLT 315.0 02/25/2017   Lab Results  Component Value Date   NA 136 02/27/2018   K 3.5 02/27/2018   CO2 30 02/27/2018   GLUCOSE 87 02/27/2018   BUN 13 02/27/2018   CREATININE 0.76 02/27/2018   BILITOT 0.4 02/27/2018   ALKPHOS 44 02/27/2018   AST 23 02/27/2018   ALT 19 02/27/2018   PROT 6.6 02/27/2018   ALBUMIN 4.1 02/27/2018   CALCIUM 9.3 02/27/2018   ANIONGAP 10 02/09/2016   GFR 81.45 02/27/2018   Lab Results  Component Value Date   CHOL 185 02/27/2018   Lab Results  Component Value Date   HDL  79.80 02/27/2018   Lab Results  Component Value Date   LDLCALC 90 02/27/2018   Lab Results  Component Value Date   TRIG 75.0 02/27/2018   Lab Results  Component Value Date   CHOLHDL 2 02/27/2018   No results found for: HGBA1C     Assessment & Plan:   Problem List Items Addressed This Visit      Active Problems   Hypertension - Primary    History: Review: taking medications as instructed, no medication side effects noted, no TIAs, no chest pain on exertion, no dyspnea on exertion, no swelling of ankles. Smoker: No.  BP Readings from Last 3 Encounters:  05/12/18 133/76  04/03/18 (!) 130/92  02/27/18 138/86   Lab Results  Component Value Date   CREATININE 0.76 02/27/2018     Assessment/Plan: 1. Medication: no change. 2. Dietary sodium restriction. 3. Regular aerobic exercise.  She will call me back with her  blood pressure readings.       Relevant Medications   triamterene-hydrochlorothiazide (MAXZIDE) 75-50 MG tablet   Other Relevant Orders   Well woman- non DM- CBC   Well woman- non DM- CMET   Well woman- non DM- lipid   well woman- non DM- TSH      I have discontinued Noam M. Wagler's fenoprofen and cyclobenzaprine. I have also changed her triamterene-hydrochlorothiazide. Additionally, I am having her maintain her cholecalciferol, Magnesium, BIOTIN PO, multivitamin, Vitamin B-12, dicyclomine, and clonazePAM.  Meds ordered this encounter  Medications  . triamterene-hydrochlorothiazide (MAXZIDE) 75-50 MG tablet    Sig: Take 1 tablet by mouth daily.    Dispense:  90 tablet    Refill:  3  . clonazePAM (KLONOPIN) 0.5 MG tablet    Sig: TAKE ONE-HALF TABLET BY MOUTH AS NEEDED FOR SLEEP    Dispense:  30 tablet    Refill:  2   Spent 20 minutes in patient care  Arnette Norris, MD

## 2019-06-13 ENCOUNTER — Encounter: Payer: Self-pay | Admitting: Family Medicine

## 2019-06-13 ENCOUNTER — Ambulatory Visit (INDEPENDENT_AMBULATORY_CARE_PROVIDER_SITE_OTHER): Payer: Self-pay | Admitting: Family Medicine

## 2019-06-13 DIAGNOSIS — I10 Essential (primary) hypertension: Secondary | ICD-10-CM

## 2019-06-13 MED ORDER — CLONAZEPAM 0.5 MG PO TABS: ORAL_TABLET | ORAL | 2 refills | Status: DC

## 2019-06-13 MED ORDER — TRIAMTERENE-HCTZ 75-50 MG PO TABS: 1.0000 | ORAL_TABLET | Freq: Every day | ORAL | 3 refills | Status: DC

## 2019-06-13 NOTE — Assessment & Plan Note (Addendum)
History: Review: taking medications as instructed, no medication side effects noted, no TIAs, no chest pain on exertion, no dyspnea on exertion, no swelling of ankles. Smoker: No.  BP Readings from Last 3 Encounters:  05/12/18 133/76  04/03/18 (!) 130/92  02/27/18 138/86   Lab Results  Component Value Date   CREATININE 0.76 02/27/2018     Assessment/Plan: 1. Medication: no change. 2. Dietary sodium restriction. 3. Regular aerobic exercise.  She will call me back with her blood pressure readings.

## 2019-06-18 ENCOUNTER — Other Ambulatory Visit: Payer: Self-pay

## 2019-06-18 ENCOUNTER — Other Ambulatory Visit (INDEPENDENT_AMBULATORY_CARE_PROVIDER_SITE_OTHER): Payer: BC Managed Care – PPO

## 2019-06-18 DIAGNOSIS — I10 Essential (primary) hypertension: Secondary | ICD-10-CM

## 2019-06-18 LAB — COMPREHENSIVE METABOLIC PANEL
ALT: 22 U/L (ref 0–35)
AST: 24 U/L (ref 0–37)
Albumin: 4.1 g/dL (ref 3.5–5.2)
Alkaline Phosphatase: 37 U/L — ABNORMAL LOW (ref 39–117)
BUN: 15 mg/dL (ref 6–23)
CO2: 29 mEq/L (ref 19–32)
Calcium: 9.2 mg/dL (ref 8.4–10.5)
Chloride: 97 mEq/L (ref 96–112)
Creatinine, Ser: 0.67 mg/dL (ref 0.40–1.20)
GFR: 88.27 mL/min (ref >60.00)
Glucose, Bld: 103 mg/dL — ABNORMAL HIGH (ref 70–99)
Potassium: 3.5 mEq/L (ref 3.5–5.1)
Sodium: 133 mEq/L — ABNORMAL LOW (ref 135–145)
Total Bilirubin: 0.5 mg/dL (ref 0.2–1.2)
Total Protein: 6.4 g/dL (ref 6.0–8.3)

## 2019-06-18 LAB — CBC WITH DIFFERENTIAL/PLATELET
Basophils Absolute: 0 10*3/uL (ref 0.0–0.1)
Basophils Relative: 0.4 % (ref 0.0–3.0)
Eosinophils Absolute: 0 10*3/uL (ref 0.0–0.7)
Eosinophils Relative: 0.8 % (ref 0.0–5.0)
HCT: 41.1 % (ref 36.0–46.0)
Hemoglobin: 13.4 g/dL (ref 12.0–15.0)
Lymphocytes Relative: 23.6 % (ref 12.0–46.0)
Lymphs Abs: 1.3 10*3/uL (ref 0.7–4.0)
MCHC: 32.5 g/dL (ref 30.0–36.0)
MCV: 85.3 fl (ref 78.0–100.0)
Monocytes Absolute: 0.4 10*3/uL (ref 0.1–1.0)
Monocytes Relative: 6.9 % (ref 3.0–12.0)
Neutro Abs: 3.7 10*3/uL (ref 1.4–7.7)
Neutrophils Relative %: 68.3 % (ref 43.0–77.0)
Platelets: 286 10*3/uL (ref 150.0–400.0)
RBC: 4.82 Mil/uL (ref 3.87–5.11)
RDW: 12.4 % (ref 11.5–15.5)
WBC: 5.4 10*3/uL (ref 4.0–10.5)

## 2019-06-18 LAB — LIPID PANEL
Cholesterol: 190 mg/dL (ref 0–200)
HDL: 88.4 mg/dL (ref >39.00)
LDL Cholesterol: 92 mg/dL (ref 0–99)
NonHDL: 101.49
Total CHOL/HDL Ratio: 2
Triglycerides: 45 mg/dL (ref 0.0–149.0)
VLDL: 9 mg/dL (ref 0.0–40.0)

## 2019-06-18 LAB — TSH: TSH: 0.77 u[IU]/mL (ref 0.35–4.50)

## 2019-07-10 ENCOUNTER — Other Ambulatory Visit: Payer: Self-pay | Admitting: Family Medicine

## 2019-07-10 MED ORDER — DICYCLOMINE HCL 20 MG PO TABS: ORAL_TABLET | ORAL | 11 refills | Status: DC

## 2019-07-21 ENCOUNTER — Ambulatory Visit: Payer: BC Managed Care – PPO | Attending: Internal Medicine

## 2019-07-21 DIAGNOSIS — Z23 Encounter for immunization: Secondary | ICD-10-CM | POA: Insufficient documentation

## 2019-07-21 NOTE — Progress Notes (Signed)
   Covid-19 Vaccination Clinic  Name:  Kelsey Simon    MRN: [id number] DOB: 1955/10/10  07/21/2019  [name] was observed post Covid-19 immunization for 15 minutes without incidence. She was provided with Vaccine Information Sheet and instruction to access the V-Safe system.   [name] was instructed to call 911 with any severe reactions post vaccine: Marland Kitchen Difficulty breathing  . Swelling of your face and throat  . A fast heartbeat  . A bad rash all over your body  . Dizziness and weakness    Immunizations Administered    Name Date Dose VIS Date Route   Pfizer COVID-19 Vaccine 07/21/2019 12:30 PM 0.3 mL 05/11/2019 Intramuscular   Manufacturer: [place]   Lot: [id number]   [place]: [id number]

## 2019-08-14 ENCOUNTER — Ambulatory Visit: Payer: BC Managed Care – PPO | Attending: Internal Medicine

## 2019-08-14 DIAGNOSIS — Z23 Encounter for immunization: Secondary | ICD-10-CM

## 2019-08-14 NOTE — Progress Notes (Signed)
   Covid-19 Vaccination Clinic  Name:  Kelsey Simon    MRN: [id number] DOB: [birth year]  08/14/2019  [name] was observed post Covid-19 immunization for 15 minutes without incident. She was provided with Vaccine Information Sheet and instruction to access the V-Safe system.   [name] was instructed to call 911 with any severe reactions post vaccine: Marland Kitchen Difficulty breathing  . Swelling of face and throat  . A fast heartbeat  . A bad rash all over body  . Dizziness and weakness   Immunizations Administered    Name Date Dose VIS Date Route   Pfizer COVID-19 Vaccine 08/14/2019 11:16 AM 0.3 mL 05/11/2019 Intramuscular   Manufacturer: [place]   Lot: [id number]   [place]: [id number]

## 2019-12-16 NOTE — Progress Notes (Signed)
Kelsey Mario T. Nyala Kirchner, MD, [place] at [facility] [place], [address]  Phone: [phone]  FAX: [place] - 64 y.o. female  MRN [id number]  Date of Birth: December 13, 1955  Date: 12/17/2019  PCP: Owens Loffler, MD  Referral: No ref. provider found  Chief Complaint  Patient presents with  . Shoulder Pain    Left  . Arm Pain    Left    This visit occurred during the SARS-CoV-2 public health emergency.  Safety protocols were in place, including screening questions prior to the visit, additional usage of staff PPE, and extensive cleaning of exam room while observing appropriate contact time as indicated for disinfecting solutions.   Subjective:   Kelsey Simon is a 64 y.o. very pleasant female patient who presents with the following: shoulder pain  The patient noted above presents with shoulder pain that has been ongoing for 4 months there is no history of trauma or accident. The patient denies neck pain or radicular symptoms. No shoulder blade pain Denies dislocation, subluxation, separation of the shoulder. The patient does complain of pain with flexion, abduction, and terminal motion.  Significant restriction of motion. she describes a deep ache around the shoulder, and sometimes it will wake the patient up at night.  She is here today in consultation regarding her left-sided arm pain. 4 months. Cannot do her arm work in [employer]. I did see her distantly about 8 years ago with some right-sided knee pain.   L shoulder.  Really weird and it feels like it turns.  She will have a clikcing.  Tries to do push-up and will do a lot on the counter.  Gardens - nothing new or different.  Wakes up from sleep.  Deep dull ache in a T-shirt distribution of pain  Has had a piched nerve in the past causing some shoulder blade pain and shoulder pain.  Hands with some  numbness but off and on for a long time.    Medications Tried: Over-the-counter NSAIDs, Tylenol Ice or Heat: minimal help Tried PT: No  Prior shoulder Injury: No Prior surgery: No Prior fracture: No   Review of Systems is noted in the HPI, as appropriate  Objective:   Blood pressure 140/80, pulse 81, temperature 98 F (36.7 C), temperature source Temporal, height 5' 5.75" (1.67 m), weight 135 lb (61.2 kg), SpO2 100 %.   GEN: No acute distress; alert,appropriate. PULM: Breathing comfortably in no respiratory distress PSYCH: Normally interactive.   Shoulder: R and L Inspection: No muscle wasting or winging Ecchymosis/edema: neg  AC joint, scapula, clavicle: NT Cervical spine: NT, full ROM Spurling's: neg ABNORMAL SIDE TESTED: Left UNLESS OTHERWISE NOTED, THE CONTRALATERAL SIDE HAS FULL RANGE OF MOTION. Abduction: 5/5, LIMITED TO 140 DEGREES Flexion: 5/5, LIMITED TO 140 DEGNO ROM  IR, lift-off: 5/5. TESTED AT 90 DEGREES OF ABDUCTION, 25  ER at neutral:  5/5, TESTED AT 90 DEGREES OF ABDUCTION, lacks 20 degrees compared to the contralateral side  AC crossover and compression: PAIN Drop Test: neg Empty Can: neg Supraspinatus insertion: NT Bicipital groove: NT ALL OTHER SPECIAL TESTING EQUIVOCAL GIVEN LOSS OF MOTION C5-T1 intact Sensation intact Grip 5/5   Assessment and Plan:     ICD-10-CM   1. Adhesive capsulitis of left shoulder  M75.02 methylPREDNISolone acetate (DEPO-MEDROL) injection 80 mg  2. Left shoulder pain, unspecified chronicity  M25.512    Total encounter  time: 30 minutes. This includes total time spent on the day of encounter.  This includes chart review, anatomic review, rehabilitation review.  Patient was given a systematic ROM protocol from [facility] to be done daily. Emphasized importance of adherence, help of PT, daily HEP.  The average length of total symptoms is 12-18 months going through 3 different phases in the freezing and thawing process.  Reviewed all with patient.   Tylenol or NSAID of choice prn for pain relief Intraarticular shoulder injections discussed with patient, which have good evidence for accelerating the thawing phase.  Patient will be sent for formal PT for aggressive frozen shoulder ROM if sx persist. Will need RTC str and scapular stabilization to fix underlying mechanics.  I appreciate the opportunity to evaluate this very friendly patient. If you have any question regarding her care or prognosis, do not hesitate to ask.  Intraarticular Shoulder Aspiration/Injection Procedure Note Kelsey Simon Mar 01, 1956 Date of procedure: 12/17/2019  Procedure: Large Joint Aspiration / Injection of Shoulder, Intraarticular, L Indications: Pain  Procedure Details Verbal consent was obtained from the patient. Risks including infection explained and contrasted with benefits and alternatives. Patient prepped with Chloraprep and Ethyl Chloride used for anesthesia. An intraarticular shoulder injection was performed using the posterior approach; needle placed into joint capsule without difficulty. The patient tolerated the procedure well and had decreased pain post injection. No complications. Injection: 8 cc of Lidocaine 1% and 2 mL Depo-Medrol 40 mg. Needle: 21 gauge, 2 inch   Follow-up: Return in about 1 month (around 01/17/2020).  Meds ordered this encounter  Medications  . methylPREDNISolone acetate (DEPO-MEDROL) injection 80 mg   There are no discontinued medications. No orders of the defined types were placed in this encounter.   Signed,  Maud Deed. Mercadez Heitman, MD   Outpatient Encounter Medications as of 12/17/2019  Medication Sig  . BIOTIN PO Take by mouth daily.  . cholecalciferol (VITAMIN D3) 25 MCG (1000 UT) tablet Take 1,000 Units by mouth daily.  . clonazePAM (KLONOPIN) 0.5 MG tablet TAKE ONE-HALF TABLET BY MOUTH AS NEEDED FOR SLEEP  . Cyanocobalamin (VITAMIN B-12) 6000 MCG SUBL Place under the tongue.    . dicyclomine (BENTYL) 20 MG tablet TAKE ONE TABLET EVERY 6 HOURS AS NEEDED  . Magnesium 250 MG TABS Take by mouth.  . Multiple Vitamin (MULTIVITAMIN) tablet Take 1 tablet by mouth daily.  Marland Kitchen triamterene-hydrochlorothiazide (MAXZIDE) 75-50 MG tablet Take 1 tablet by mouth daily.  . [EXPIRED] methylPREDNISolone acetate (DEPO-MEDROL) injection 80 mg    No facility-administered encounter medications on file as of 12/17/2019.

## 2019-12-17 ENCOUNTER — Encounter: Payer: Self-pay | Admitting: Family Medicine

## 2019-12-17 ENCOUNTER — Ambulatory Visit: Payer: BC Managed Care – PPO | Admitting: Family Medicine

## 2019-12-17 ENCOUNTER — Other Ambulatory Visit: Payer: Self-pay

## 2019-12-17 VITALS — BP 140/80 | HR 81 | Temp 98.0°F | Ht 65.75 in | Wt 135.0 lb

## 2019-12-17 DIAGNOSIS — M7502 Adhesive capsulitis of left shoulder: Secondary | ICD-10-CM

## 2019-12-17 DIAGNOSIS — M25512 Pain in left shoulder: Secondary | ICD-10-CM

## 2019-12-17 MED ORDER — METHYLPREDNISOLONE ACETATE 40 MG/ML IJ SUSP
80.0000 mg | Freq: Once | INTRAMUSCULAR | Status: AC
  Administered 2019-12-17: 80 mg via INTRA_ARTICULAR

## 2020-01-02 ENCOUNTER — Ambulatory Visit: Payer: BC Managed Care – PPO | Admitting: Nurse Practitioner

## 2020-01-02 ENCOUNTER — Other Ambulatory Visit: Payer: Self-pay

## 2020-01-02 NOTE — Progress Notes (Signed)
New Patient Office Visit  Subjective:  Patient ID: Kelsey Simon, female    DOB: 1955-07-09  Age: 64 y.o. MRN: [id number]  CC:  Chief Complaint  Patient presents with  . Establish Care    HPI Kelsey Simon presents to establish care. She was actively coughing and had nasal congestion in the examination room. She reports 2 days of "allergy sypmotms. " She was told that she would need to complete the visit as a telephone call while she is in her car and  that any patient with her type of symptoms, could not be seen in-person. I re-assured her that a visit to establish care is easily performed this way and that I could order her mammogram or other testing as needed. The patient walked out and did not answer her phone.   Past Medical History:  Diagnosis Date  . GERD (gastroesophageal reflux disease)    depending on diet   . Hx of adenomatous colonic polyps 05/18/2018  . Hypertension   . IBS (irritable bowel syndrome)   . PONV (postoperative nausea and vomiting)   . Varicose veins     Past Surgical History:  Procedure Laterality Date  . BREAST BIOPSY Right 2017 and 2006   x 2  . BREAST EXCISIONAL BIOPSY    . BREAST LUMPECTOMY WITH RADIOACTIVE SEED LOCALIZATION Right 02/13/2016   Procedure: RIGHT BREAST LUMPECTOMY WITH RADIOACTIVE SEED LOCALIZATION;  Surgeon: Autumn Messing III, MD;  Location: [facility];  Service: General;  Laterality: Right;  . COLONOSCOPY    . NASAL SEPTUM SURGERY  1990  . UPPER GASTROINTESTINAL ENDOSCOPY    . WISDOM TOOTH EXTRACTION      Family History  Problem Relation Age of Onset  . Heart attack Father   . Lung cancer Father   . Drug abuse Daughter   . Colon cancer Paternal Grandfather   . Stomach cancer Neg Hx   . Esophageal cancer Neg Hx   . Rectal cancer Neg Hx   . Liver cancer Neg Hx   . Breast cancer Neg Hx   . Colon polyps Neg Hx     Social History   Socioeconomic History  . Marital status: Married    Spouse name: Not  on file  . Number of children: 1  . Years of education: Not on file  . Highest education level: Not on file  Occupational History  . Occupation: Freight forwarder  Tobacco Use  . Smoking status: Former Research scientist (life sciences)  . Smokeless tobacco: Never Used  . Tobacco comment: quit smoking 25 years ago  Vaping Use  . Vaping Use: Never used  Substance and Sexual Activity  . Alcohol use: Yes    Comment: wine daily  . Drug use: No  . Sexual activity: Yes  Other Topics Concern  . Not on file  Social History Narrative  . Not on file   Social Determinants of Health   Financial Resource Strain:   . Difficulty of Paying Living Expenses:   Food Insecurity:   . Worried About Charity fundraiser in the Last Year:   . Arboriculturist in the Last Year:   Transportation Needs:   . Film/video editor (Medical):   Marland Kitchen Lack of Transportation (Non-Medical):   Physical Activity:   . Days of Exercise per Week:   . Minutes of Exercise per Session:   Stress:   . Feeling of Stress :   Social Connections:   . Frequency of Communication with  Friends and Family:   . Frequency of Social Gatherings with Friends and Family:   . Attends Religious Services:   . Active Member of Clubs or Organizations:   . Attends Archivist Meetings:   Marland Kitchen Marital Status:   Intimate Partner Violence:   . Fear of Current or Ex-Partner:   . Emotionally Abused:   Marland Kitchen Physically Abused:   . Sexually Abused:     ROS Review of Systems  Objective:   Today's Vitals: BP (!) 148/88   Pulse 82   Temp 97.8 F (36.6 C)   Resp 16   Ht 5\' 6"  (1.676 m)   Wt 132 lb 9.6 oz (60.1 kg)   SpO2 99%   BMI 21.40 kg/m   Physical Exam  Assessment & Plan:   Problem List Items Addressed This Visit    None      Outpatient Encounter Medications as of 01/02/2020  Medication Sig  . BIOTIN PO Take by mouth daily.  . cholecalciferol (VITAMIN D3) 25 MCG (1000 UT) tablet Take 1,000 Units by mouth daily.  . clonazePAM (KLONOPIN) 0.5 MG  tablet TAKE ONE-HALF TABLET BY MOUTH AS NEEDED FOR SLEEP  . Cyanocobalamin (VITAMIN B-12) 6000 MCG SUBL Place under the tongue.  . dicyclomine (BENTYL) 20 MG tablet TAKE ONE TABLET EVERY 6 HOURS AS NEEDED  . Magnesium 250 MG TABS Take by mouth.  . Multiple Vitamin (MULTIVITAMIN) tablet Take 1 tablet by mouth daily.  Marland Kitchen triamterene-hydrochlorothiazide (MAXZIDE) 75-50 MG tablet Take 1 tablet by mouth daily.   No facility-administered encounter medications on file as of 01/02/2020.    Follow-up: No follow-ups on file.   Denice Paradise, NP

## 2020-01-16 ENCOUNTER — Telehealth (INDEPENDENT_AMBULATORY_CARE_PROVIDER_SITE_OTHER): Payer: BC Managed Care – PPO | Admitting: Nurse Practitioner

## 2020-01-16 ENCOUNTER — Encounter: Payer: Self-pay | Admitting: Nurse Practitioner

## 2020-01-16 ENCOUNTER — Other Ambulatory Visit: Payer: Self-pay

## 2020-01-16 VITALS — Ht 66.0 in | Wt 131.0 lb

## 2020-01-16 DIAGNOSIS — I10 Essential (primary) hypertension: Secondary | ICD-10-CM | POA: Diagnosis not present

## 2020-01-16 DIAGNOSIS — Z1231 Encounter for screening mammogram for malignant neoplasm of breast: Secondary | ICD-10-CM

## 2020-01-16 DIAGNOSIS — F419 Anxiety disorder, unspecified: Secondary | ICD-10-CM | POA: Diagnosis not present

## 2020-01-16 DIAGNOSIS — Z13820 Encounter for screening for osteoporosis: Secondary | ICD-10-CM

## 2020-01-16 NOTE — Progress Notes (Signed)
Virtual Visit via Virtual Note  This visit type was conducted due to national recommendations for restrictions regarding the COVID-19 pandemic (e.g. social distancing).  This format is felt to be most appropriate for this patient at this time.  All issues noted in this document were discussed and addressed.  No physical exam was performed (except for noted visual exam findings with Video Visits).   I connected with@ on 01/20/20 at 11:00 AM EDT by a video enabled telemedicine application or telephone and verified that I am speaking with the correct person using two identifiers. Location patient: work Location provider: work  Persons participating in the virtual visit: patient, provider  I discussed the limitations, risks, security and privacy concerns of performing an evaluation and management service by telephone and the availability of in person appointments. I also discussed with the patient that there may be a patient responsible charge related to this service. The patient expressed understanding and agreed to proceed.  Reason for visit: Transfer of care as she was a  Dr. Marjory Lies patient. She is well and has no concerns other than needing her mammogram ordered. She had a CPE 06/2019 with labs: Normal CBC, TSH, lipids. Na low 133, alk phos 37 otherwise liver normal. Pap 02/2017. Due this year age 3.  Right breast tenderness: History of right breast architectural distortion in 2017 with excisional biopsy pathology results showing benign fibroglandular change and no suspicious findings. Most recent mammogram was a diagnostic 01/18/2019 with negative findings and recommendation for a screening mammogram in one year. She has tenderness right breast at previous incision  site and it has never returned to normal. She wants another diagnostic mammo ordered.   HTN: Taking Maxzide 75-50 mg daily. Reports BP is doing well. Did not take BP today. Na 133. No CP/SOB. BP elevated last visit.   BP Readings from  Last 3 Encounters:  01/02/20 (!) 148/88  12/17/19 140/80  05/12/18 133/76   Anxiety: No anxiety concerns. Only takes Klonopin if need for sleep- rarely uses and never takes a  whole one. 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  IBS: She takes rare Bentyl- for stress events. Stable IBS.  Colonoscopy 2019- UTD  ROS: See pertinent positives and negatives per HPI.   Past Medical History:  Diagnosis Date  . GERD (gastroesophageal reflux disease)    depending on diet   . Hx of adenomatous colonic polyps 05/18/2018  . Hypertension   . IBS (irritable bowel syndrome)   . PONV (postoperative nausea and vomiting)   . Varicose veins     Past Surgical History:  Procedure Laterality Date  . BREAST BIOPSY Right 2017 and 2006   x 2  . BREAST EXCISIONAL BIOPSY    . BREAST LUMPECTOMY WITH RADIOACTIVE SEED LOCALIZATION Right 02/13/2016   Procedure: RIGHT BREAST LUMPECTOMY WITH RADIOACTIVE SEED LOCALIZATION;  Surgeon: Autumn Messing III, MD;  Location: [facility];  Service: General;  Laterality: Right;  . COLONOSCOPY    . NASAL SEPTUM SURGERY  1990  . UPPER GASTROINTESTINAL ENDOSCOPY    . WISDOM TOOTH EXTRACTION      Family History  Problem Relation Age of Onset  . Heart attack Father   . Lung cancer Father   . Drug abuse Daughter   . Colon cancer Paternal Grandfather   . Stomach cancer Neg Hx   . Esophageal cancer Neg Hx   . Rectal cancer Neg Hx   . Liver cancer Neg Hx   .  Breast cancer Neg Hx   . Colon polyps Neg Hx     SOCIAL HX: Former- quit 25 yrs ago   Current Outpatient Medications:  .  BIOTIN PO, Take by mouth daily., Disp: , Rfl:  .  cholecalciferol (VITAMIN D3) 25 MCG (1000 UT) tablet, Take 1,000 Units by mouth daily., Disp: , Rfl:  .  clonazePAM (KLONOPIN) 0.5 MG tablet, TAKE ONE-HALF TABLET BY MOUTH AS NEEDED FOR SLEEP, Disp: 30 tablet, Rfl: 2 .  Magnesium 250 MG TABS, Take by mouth., Disp: , Rfl:  .  Multiple Vitamin  (MULTIVITAMIN) tablet, Take 1 tablet by mouth daily., Disp: , Rfl:  .  triamterene-hydrochlorothiazide (MAXZIDE) 75-50 MG tablet, Take 1 tablet by mouth daily., Disp: 90 tablet, Rfl: 3  EXAM:  VITALS per patient if applicable:  GENERAL: alert, oriented, appears well and in no acute distress  HEENT: atraumatic, conjunctiva clear, no obvious abnormalities on inspection of external nose and ears  NECK: normal movements of the head and neck  LUNGS: on inspection no signs of respiratory distress, breathing rate appears normal, no obvious gross SOB, gasping or wheezing  CV: no obvious cyanosis  MS: moves all visible extremities without noticeable abnormality  PSYCH/NEURO: pleasant and cooperative, no obvious depression or anxiety, speech and thought processing grossly intact  ASSESSMENT AND PLAN: She wants her mammogram done at [facility] and request a diagnostic as she still has sensation difference in right breast at site of previous excisional  biopsy. She is eligible for screening Dexa at 63 as well.   Patient advised:   Please call and schedule your 3D mammogram as discussed. I placed an order for a diagnostic mammogram for your right breast discomfort that is still present at the excisional biopsy site.   You can also schedule your first screening DEXA- bone density at the same  time.   [facility] in: [facility] Address: [address], [place], [place] [address] Hours:  Closed ? [facility] Phone: [phone]  Due for a PAP in Oct- at 63 yo it is no longer recommended as a routine screening test. However, we do repeat it if you want to continue this screening. Your last Pap  was 3 years ago. Make an in- person appt if you decide to have it done.   Recommend labs: Cmet- to  monitor the sodium  as yours was lower than normal 7 mos ago  and yuor BP medication can do this. Also, recommend a Vit D, B12  at  next lab draw. Please schedule a lab appt when it is convenient for you- not fasting.   You are due for pneumonia vaccine PCV 13 vaccine, Flu shot, and  the Shingles vaccine 2 part series.  You may get those here, or at your work if it is offered or at your pharmacy. If you want to get those here- please schedule a nurse visit.   You will likely need a Covid booster in Sept and will get that at your pharmacy when those are offered.   Essential hypertension - Plan: Comp Met (CMET)  Encounter for screening mammogram for malignant neoplasm of breast - Plan: MM Digital Diagnostic Bilat  Anxiety - Plan: B12 and Folate Panel  Screening for osteoporosis - Plan: DG Bone Density, VITAMIN D 25 Hydroxy (Vit-D Deficiency, Fractures)  Essential hypertension BP elevated last OV. To monitor BP at home. Last sodium in Jan 133- recommend repeat check to  make sure it is not drifting.   BP Readings from Last 3 Encounters:  01/02/20 (!) 148/88  12/17/19 140/80  05/12/18 133/76    Anxiety Reports no anxiety concerns and take rare 1/2 tab  clonazepam for sleep.  I discussed the assessment and treatment plan with the patient. The patient was provided an opportunity to ask questions and all were answered. The patient agreed with the plan and demonstrated an understanding of the instructions.   The patient was advised to call back or seek an in-person evaluation if the symptoms worsen or if the condition fails to improve as anticipated.  I provided 30 minutes of non-face-to-face time during this encounter.  Denice Paradise, NP Adult Nurse Practitioner [place] [phone]

## 2020-01-20 ENCOUNTER — Encounter: Payer: Self-pay | Admitting: Nurse Practitioner

## 2020-01-20 DIAGNOSIS — Z13820 Encounter for screening for osteoporosis: Secondary | ICD-10-CM | POA: Insufficient documentation

## 2020-01-20 DIAGNOSIS — Z1231 Encounter for screening mammogram for malignant neoplasm of breast: Secondary | ICD-10-CM | POA: Insufficient documentation

## 2020-01-20 DIAGNOSIS — Z0000 Encounter for general adult medical examination without abnormal findings: Secondary | ICD-10-CM | POA: Insufficient documentation

## 2020-01-20 NOTE — Patient Instructions (Addendum)
Please call and schedule your 3D mammogram as discussed. I placed an order for a diagnostic mammogram for your right breast discomfort that is still present at the excisional biopsy site.   You can also schedule your first screening DEXA- bone density at the same  time.   [facility] in: [facility] Address: [address], [place], [place] [address] Hours:  Closed ? [facility] Phone: [phone]  Due for a PAP in Oct- at 64 yo it is no longer recommended as a routine screening test. However, we do repeat it if you want to continue this screening. Your last Pap  was 3 years ago. Make an in- person appt if you decide to have it done.   Recommend labs: Cmet- to  monitor the sodium  as yours was lower than normal 7 mos ago  and yuor BP medication can do this. Also, recommend a Vit D, B12  at next lab draw. Please schedule a lab appt when it is convenient for you- not fasting.   You are due for pneumonia vaccine PCV 13 vaccine, Flu shot, and  the Shingles vaccine 2 part series.  You may get those here, or at your work if it is offered or at your pharmacy. If you want to get those here- please schedule a nurse visit.   You will likely need a Covid booster in Sept and will get that at your pharmacy when those are offered.   Follow up for CPE annual 06/07/2020.

## 2020-01-20 NOTE — Assessment & Plan Note (Signed)
BP elevated last OV. To monitor BP at home. Last sodium in Jan 133- recommend repeat check to make sure it is not drifting.   BP Readings from Last 3 Encounters:  01/02/20 (!) 148/88  12/17/19 140/80  05/12/18 133/76

## 2020-01-20 NOTE — Assessment & Plan Note (Signed)
Reports no anxiety concerns and take rare 1/2 tab  clonazepam for sleep.

## 2020-01-21 ENCOUNTER — Other Ambulatory Visit: Payer: Self-pay | Admitting: Nurse Practitioner

## 2020-01-21 DIAGNOSIS — Z1231 Encounter for screening mammogram for malignant neoplasm of breast: Secondary | ICD-10-CM

## 2020-01-31 ENCOUNTER — Other Ambulatory Visit: Payer: Self-pay | Admitting: Nurse Practitioner

## 2020-01-31 DIAGNOSIS — N644 Mastodynia: Secondary | ICD-10-CM

## 2020-02-05 ENCOUNTER — Ambulatory Visit
Admission: RE | Admit: 2020-02-05 | Discharge: 2020-02-05 | Disposition: A | Discharge status: Home / Self Care | Payer: BC Managed Care – PPO | Source: Ambulatory Visit | Attending: Nurse Practitioner | Admitting: Nurse Practitioner

## 2020-02-05 ENCOUNTER — Other Ambulatory Visit: Payer: Self-pay

## 2020-02-05 ENCOUNTER — Other Ambulatory Visit: Payer: Self-pay | Admitting: Nurse Practitioner

## 2020-02-05 DIAGNOSIS — N6312 Unspecified lump in the right breast, upper inner quadrant: Secondary | ICD-10-CM | POA: Diagnosis not present

## 2020-02-05 DIAGNOSIS — N644 Mastodynia: Secondary | ICD-10-CM

## 2020-02-18 ENCOUNTER — Ambulatory Visit: Payer: BC Managed Care – PPO | Admitting: Family Medicine

## 2020-02-18 ENCOUNTER — Encounter: Payer: Self-pay | Admitting: Family Medicine

## 2020-02-18 ENCOUNTER — Ambulatory Visit
Admission: RE | Admit: 2020-02-18 | Discharge: 2020-02-18 | Disposition: A | Discharge status: Home / Self Care | Payer: BC Managed Care – PPO | Source: Ambulatory Visit | Attending: Nurse Practitioner | Admitting: Nurse Practitioner

## 2020-02-18 ENCOUNTER — Other Ambulatory Visit: Payer: Self-pay

## 2020-02-18 VITALS — BP 136/76 | HR 72 | Temp 97.9°F | Ht 65.75 in | Wt 134.2 lb

## 2020-02-18 DIAGNOSIS — M7502 Adhesive capsulitis of left shoulder: Secondary | ICD-10-CM

## 2020-02-18 DIAGNOSIS — N6312 Unspecified lump in the right breast, upper inner quadrant: Secondary | ICD-10-CM | POA: Diagnosis not present

## 2020-02-18 DIAGNOSIS — C50211 Malignant neoplasm of upper-inner quadrant of right female breast: Secondary | ICD-10-CM | POA: Diagnosis not present

## 2020-02-18 DIAGNOSIS — N644 Mastodynia: Secondary | ICD-10-CM

## 2020-02-18 NOTE — Progress Notes (Signed)
    Kelsey Simon T. Kelsey Dettore, MD, [place]  [facility] and [place] [facility] [place], [address]  Phone: [phone]  FAX: [place] - 64 y.o. female  MRN [id number]  Date of Birth: August 12, 1955  Date: 02/18/2020  PCP: Marval Regal, NP  Referral: Owens Loffler, MD  Chief Complaint  Patient presents with  . Follow-up    Left Shoulder    This visit occurred during the SARS-CoV-2 public health emergency.  Safety protocols were in place, including screening questions prior to the visit, additional usage of staff PPE, and extensive cleaning of exam room while observing appropriate contact time as indicated for disinfecting solutions.   Subjective:   Kelsey Simon is a 64 y.o. very pleasant female patient with Body mass index is 21.83 kg/m. who presents with the following:  Kelsey Simon is here in follow-up of some left-sided adhesive capsulitis.  On her last office visit, I did do an intra-articular injection of Depo-Medrol, and I also had her started doing [facility] frozen shoulder protocol.  On her last office visit her movement in the plane of abduction and flexion was approximately to 140  He really is doing quite a bit better from a motion standpoint, but she does still have some pain with terminal motion including internal range of motion.  She finds this frustrating.  She has returned back to some activities in the gym.  Plus or minus rehab program.  Review of Systems is noted in the HPI, as appropriate   Objective:   BP 136/76   Pulse 72   Temp 97.9 F (36.6 C) (Temporal)   Ht 5' 5.75" (1.67 m)   Wt 134 lb 4 oz (60.9 kg)   SpO2 99%   BMI 21.83 kg/m    GEN: No acute distress; alert,appropriate. PULM: Breathing comfortably in no respiratory distress PSYCH: Normally interactive.    Right shoulder with full range of motion and strength 5/5  Left shoulder strength  5/5. Neurovascularly intact throughout.  Left shoulder lacks approximately 10 degrees of flexion and abduction compared to the contralateral side. External rotation differentiation of approximately 10 degrees compared to the contralateral side.  Internal range of motion to approximately 20.  Radiology:   Assessment and Plan:     ICD-10-CM   1. Adhesive capsulitis of left shoulder  M75.02    Clinically she is quite a bit improved, and I encouraged her to remain physically active and return to the gym.  Watch out for terminal motion pain, but I do think she will be fine.  Follow-up with me as needed:  Signed,  Dejah Droessler T. Savayah Waltrip, MD   Outpatient Encounter Medications as of 02/18/2020  Medication Sig  . BIOTIN PO Take by mouth daily.  . cholecalciferol (VITAMIN D3) 25 MCG (1000 UT) tablet Take 1,000 Units by mouth daily.  . clonazePAM (KLONOPIN) 0.5 MG tablet TAKE ONE-HALF TABLET BY MOUTH AS NEEDED FOR SLEEP  . Magnesium 250 MG TABS Take by mouth.  . Multiple Vitamin (MULTIVITAMIN) tablet Take 1 tablet by mouth daily.  Marland Kitchen triamterene-hydrochlorothiazide (MAXZIDE) 75-50 MG tablet Take 1 tablet by mouth daily.   No facility-administered encounter medications on file as of 02/18/2020.

## 2020-02-21 ENCOUNTER — Telehealth: Payer: Self-pay | Admitting: Nurse Practitioner

## 2020-02-21 NOTE — Telephone Encounter (Signed)
I spoke to Kelsey Simon about her breast Bx and improved shoulder capsulitis. She is doing well and is comfortable with her plan of care.

## 2020-02-26 DIAGNOSIS — Z170 Estrogen receptor positive status [ER+]: Secondary | ICD-10-CM | POA: Diagnosis not present

## 2020-02-26 DIAGNOSIS — C50211 Malignant neoplasm of upper-inner quadrant of right female breast: Secondary | ICD-10-CM | POA: Diagnosis not present

## 2020-02-27 ENCOUNTER — Telehealth: Payer: Self-pay | Admitting: Hematology

## 2020-02-27 NOTE — Telephone Encounter (Signed)
Received a new pt referral from Dr. Marlou Starks for new dx of breast cancer. Pt has been cld and scheduled to see Dr. Burr Medico on 10/4 at 3pm. Pt aware to arrive 20 minutes early.

## 2020-02-29 NOTE — Progress Notes (Signed)
Index   [phone] Fax:([phone] [place] Note   Patient Care Team: Marval Regal, NP as PCP - General (Nurse Practitioner)  Date of Service:  03/03/2020   CHIEF COMPLAINTS/PURPOSE OF CONSULTATION:  Newly diagnosed Malignant neoplasm of upper-inner quadrant of right breast  REFERRING PHYSICIAN:  Dr Marlou Starks   Oncology History Overview Note  Cancer Staging Malignant neoplasm of upper-inner quadrant of right breast in female, estrogen receptor positive ([place]) Staging form: Breast, AJCC 8th Edition - Clinical stage from 02/18/2020: Stage IA (cT1b, cN0, cM0, G1, ER+, PR+, HER2-) - Signed by Truitt Merle, MD on 03/02/2020    Malignant neoplasm of upper-inner quadrant of right breast in female, estrogen receptor positive ([place])  02/05/2020 Mammogram   Mammogram 02/05/20  IMPRESSION: Right breast mass at 2 o'clock measuring 0.5 x 0.6 x 0.6 cm and 5cmfn is indeterminate.   02/18/2020 Cancer Staging   Staging form: Breast, AJCC 8th Edition - Clinical stage from 02/18/2020: Stage IA (cT1b, cN0, cM0, G1, ER+, PR+, HER2-) - Signed by Truitt Merle, MD on 03/02/2020   02/18/2020 Initial Biopsy   Diagnosis 02/18/20  Breast, right, needle core biopsy, 2 o'clock - INVASIVE DUCTAL CARCINOMA, GRADE 1. - SEE MICROSCOPIC DESCRIPTION Microscopic Comment The greatest tumor dimension is 0.7 cm. A breast prognostic profile will be performed. Immunohistochemistry of basal cell markers (Calponin, p63 and smooth muscle myosin) supports the diagnosis.   02/18/2020 Receptors her2   PROGNOSTIC INDICATORS Results: IMMUNOHISTOCHEMICAL AND MORPHOMETRIC ANALYSIS PERFORMED MANUALLY The tumor cells are NEGATIVE for Her2 (1+). Estrogen Receptor: 95%, POSITIVE, STRONG STAINING INTENSITY Progesterone Receptor: 95%, POSITIVE, STRONG STAINING INTENSITY Proliferation Marker Ki67: 5%   03/02/2020 Initial Diagnosis   Malignant neoplasm of upper-inner quadrant of right breast in  female, estrogen receptor positive ([place])      HISTORY OF PRESENTING ILLNESS:  Kelsey Simon 64 y.o. female is a here because of newly diagnosed right breast cancer. The patient was referred by Dr Marlou Starks. The patient presents to the clinic today alone.  She had Right breast lumpectomy in 01/2016 with a benign fibrocystic mass. She felt a mass in her right breast 1 year ago and her 12/2018 [place] and mammogram was normal. She notes she has intermittently felt a mass since then. Her 01/2020 mammogram came back abnormal and biopsy confirmed breast cancer. She denies major changes in breast or body lately. She notes having IBS, but no abnormal changes in BM, eating, breathing, weight or abdomen.   Socially she lives in [place] with husband and her 58 year old granddaughter. 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 She notes she would like to get Rad Onc in [place], but continue care here. She drinks 1 glass a wine a day. She quit smoking after smoking 20 years few cigarettes a day.    She has a PMHx of IBS. She plans to have baseline DEXA in 04/2020. She has anxiety and trouble sleeping. Her PGF had colon cancer and her father had lung cancer from smoking. She notes her menopause was tolerable with 10 episodes of hot flashes. She notes she is more interested in quality of life than quality of life and may not be on long term AI if she cannot tolerate it.     GYN HISTORY  Menarchal: 11 LMP: 49 GP: 1 daughter    REVIEW OF SYSTEMS:    Constitutional: Denies fevers, chills or abnormal night sweats Eyes: Denies blurriness  of vision, double vision or watery eyes Ears, nose, mouth, throat, and face: Denies mucositis or sore throat Respiratory: Denies cough, dyspnea or wheezes Cardiovascular: Denies palpitation, chest discomfort or lower extremity swelling Gastrointestinal:  Denies nausea, heartburn or change in bowel habits Skin: Denies abnormal skin  rashes Lymphatics: Denies new lymphadenopathy or easy bruising Neurological:Denies numbness, tingling or new weaknesses Behavioral/Psych: Mood is stable, no new changes  BREAST: (+) right breast pain and intermittent right breast mass All other systems were reviewed with the patient and are negative.   MEDICAL HISTORY:  Past Medical History:  Diagnosis Date  . Hx of adenomatous colonic polyps 05/18/2018  . Hypertension   . IBS (irritable bowel syndrome)   . PONV (postoperative nausea and vomiting)   . Varicose veins     SURGICAL HISTORY: Past Surgical History:  Procedure Laterality Date  . BREAST BIOPSY Right 2017 and 2006   x 2  . BREAST EXCISIONAL BIOPSY    . BREAST LUMPECTOMY WITH RADIOACTIVE SEED LOCALIZATION Right 02/13/2016   Procedure: RIGHT BREAST LUMPECTOMY WITH RADIOACTIVE SEED LOCALIZATION;  Surgeon: Autumn Messing III, MD;  Location: [facility];  Service: General;  Laterality: Right;  . COLONOSCOPY    . NASAL SEPTUM SURGERY  1990  . UPPER GASTROINTESTINAL ENDOSCOPY    . WISDOM TOOTH EXTRACTION      SOCIAL HISTORY: Social History   Socioeconomic History  . Marital status: Married    Spouse name: Not on file  . Number of children: 1  . Years of education: Not on file  . Highest education level: Not on file  Occupational History  . Occupation: Freight forwarder  Tobacco Use  . Smoking status: Former Smoker    Packs/day: 0.25    Years: 20.00    Pack years: 5.00    Quit date: 05/31/1993    Years since quitting: 26.7  . Smokeless tobacco: Never Used  . Tobacco comment: quit smoking 25 years ago  Vaping Use  . Vaping Use: Never used  Substance and Sexual Activity  . Alcohol use: Yes    Alcohol/week: 7.0 standard drinks    Types: 7 Glasses of wine per week    Comment: wine daily  . Drug use: No  . Sexual activity: Yes  Other Topics Concern  . Not on file  Social History Narrative  . Not on file   Social Determinants of Health   Financial Resource  Strain:   . Difficulty of Paying Living Expenses: Not on file  Food Insecurity:   . Worried About Charity fundraiser in the Last Year: Not on file  . Ran Out of Food in the Last Year: Not on file  Transportation Needs:   . Lack of Transportation (Medical): Not on file  . Lack of Transportation (Non-Medical): Not on file  Physical Activity:   . Days of Exercise per Week: Not on file  . Minutes of Exercise per Session: Not on file  Stress:   . Feeling of Stress : Not on file  Social Connections:   . Frequency of Communication with Friends and Family: Not on file  . Frequency of Social Gatherings with Friends and Family: Not on file  . Attends Religious Services: Not on file  . Active Member of Clubs or Organizations: Not on file  . Attends Archivist Meetings: Not on file  . Marital Status: Not on file  Intimate Partner Violence:   . Fear of Current or Ex-Partner: Not on file  .  Emotionally Abused: Not on file  . Physically Abused: Not on file  . Sexually Abused: Not on file    FAMILY HISTORY: Family History  Problem Relation Age of Onset  . Heart attack Father   . Lung cancer Father   . Cancer Father        lung cancer  . Drug abuse Daughter   . Colon cancer Paternal Grandfather   . Stomach cancer Neg Hx   . Esophageal cancer Neg Hx   . Rectal cancer Neg Hx   . Liver cancer Neg Hx   . Breast cancer Neg Hx   . Colon polyps Neg Hx     ALLERGIES:  is allergic to penicillins.  MEDICATIONS:  Current Outpatient Medications  Medication Sig Dispense Refill  . BIOTIN PO Take by mouth daily.    . cholecalciferol (VITAMIN D3) 25 MCG (1000 UT) tablet Take 1,000 Units by mouth daily.    . clonazePAM (KLONOPIN) 0.5 MG tablet TAKE ONE-HALF TABLET BY MOUTH AS NEEDED FOR SLEEP 30 tablet 2  . Magnesium 250 MG TABS Take by mouth.    . Multiple Vitamin (MULTIVITAMIN) tablet Take 1 tablet by mouth daily.    Marland Kitchen triamterene-hydrochlorothiazide (MAXZIDE) 75-50 MG tablet Take 1  tablet by mouth daily. 90 tablet 3   No current facility-administered medications for this visit.    PHYSICAL EXAMINATION: ECOG PERFORMANCE STATUS: 0 - Asymptomatic  Vitals:   03/03/20 1453  Pulse: 73  Resp: 17  Temp: 97.6 F (36.4 C)  SpO2: 100%   Filed Weights   03/03/20 1453  Weight: 136 lb 4.8 oz (61.8 kg)    GENERAL:alert, no distress and comfortable SKIN: skin color, texture, turgor are normal, no rashes or significant lesions EYES: normal, Conjunctiva are pink and non-injected, sclera clear  NECK: supple, thyroid normal size, non-tender, without nodularity LYMPH:  no palpable lymphadenopathy in the cervical, axillary  LUNGS: clear to auscultation and percussion with normal breathing effort HEART: regular rate & rhythm and no murmurs and no lower extremity edema ABDOMEN:abdomen soft, non-tender and normal bowel sounds Musculoskeletal:no cyanosis of digits and no clubbing  NEURO: alert & oriented x 3 with fluent speech, no focal motor/sensory deficits BREAST: H/lo right lumpectomy: Surgical incision healed well. (+) B/l lumpy breast tissue No palpable mass, nodules or adenopathy bilaterally. Breast exam benign.  LABORATORY DATA:  I have reviewed the data as listed CBC Latest Ref Rng & Units 06/18/2019 02/25/2017 04/09/2016  WBC 4.0 - 10.5 K/uL 5.4 4.0 6.5  Hemoglobin 12.0 - 15.0 g/dL 13.4 14.3 13.9  Hematocrit 36 - 46 % 41.1 42.5 41.2  Platelets 150 - 400 K/uL 286.0 315.0 336.0    CMP Latest Ref Rng & Units 06/18/2019 02/27/2018 02/25/2017  Glucose 70 - 99 mg/dL 103(H) 87 96  BUN 6 - 23 mg/dL '15 13 12  ' Creatinine 0.40 - 1.20 mg/dL 0.67 0.76 0.82  Sodium 135 - 145 mEq/L 133(L) 136 134(L)  Potassium 3.5 - 5.1 mEq/L 3.5 3.5 4.0  Chloride 96 - 112 mEq/L 97 98 97  CO2 19 - 32 mEq/L '29 30 30  ' Calcium 8.4 - 10.5 mg/dL 9.2 9.3 9.6  Total Protein 6.0 - 8.3 g/dL 6.4 6.6 6.7  Total Bilirubin 0.2 - 1.2 mg/dL 0.5 0.4 0.6  Alkaline Phos 39 - 117 U/L 37(L) 44 37(L)  AST 0 - 37  U/L '24 23 18  ' ALT 0 - 35 U/L '22 19 18     ' RADIOGRAPHIC STUDIES: I have personally reviewed the  radiological images as listed and agreed with the findings in the report. US BREAST LTD UNI RIGHT INC AXILLA  Result Date: 02/05/2020 CLINICAL DATA:  64 year old female presenting with a palpable area of concern in the right breast that has been present for several years. Patient has history of a right excisional biopsy in 2017. EXAM: DIGITAL DIAGNOSTIC BILATERAL MAMMOGRAM WITH CAD AND TOMO ULTRASOUND RIGHT BREAST COMPARISON:  Previous exam(s). ACR Breast Density Category c: The breast tissue is heterogeneously dense, which may obscure small masses. FINDINGS: Mammogram: Right breast: A skin BB marks the palpable site of concern reported by the patient in the upper inner quadrant. No definite new mammographic abnormality is identified on the full field or spot compression tomosynthesis views. There is no new suspicious finding elsewhere in the right breast. Left breast: No suspicious mass, distortion, or microcalcifications are identified to suggest presence of malignancy. Mammographic images were processed with CAD. Ultrasound: Targeted ultrasound is performed at the palpable site of concern reported by the patient in the right breast at 2 o'clock 5 cm from the nipple demonstrating an irregular hypoechoic mass measuring 0.5 x 0.6 x 0.6 cm. There is associated vascularity. Targeted ultrasound of the right axilla demonstrates normal-appearing lymph nodes. IMPRESSION: Right breast mass at 2 o'clock measuring 0.6 cm is indeterminate. RECOMMENDATION: Ultrasound-guided core needle biopsy of the right breast mass at 2 o'clock. I have discussed the findings and recommendations with the patient. The patient will be scheduled for biopsy prior to leaving our office today. BI-RADS CATEGORY  4: Suspicious. Electronically Signed   By: Audie Pinto M.D.   On: 02/05/2020 16:05   MM DIAG BREAST TOMO BILATERAL  Result Date:  02/05/2020 CLINICAL DATA:  64 year old female presenting with a palpable area of concern in the right breast that has been present for several years. Patient has history of a right excisional biopsy in 2017. EXAM: DIGITAL DIAGNOSTIC BILATERAL MAMMOGRAM WITH CAD AND TOMO ULTRASOUND RIGHT BREAST COMPARISON:  Previous exam(s). ACR Breast Density Category c: The breast tissue is heterogeneously dense, which may obscure small masses. FINDINGS: Mammogram: Right breast: A skin BB marks the palpable site of concern reported by the patient in the upper inner quadrant. No definite new mammographic abnormality is identified on the full field or spot compression tomosynthesis views. There is no new suspicious finding elsewhere in the right breast. Left breast: No suspicious mass, distortion, or microcalcifications are identified to suggest presence of malignancy. Mammographic images were processed with CAD. Ultrasound: Targeted ultrasound is performed at the palpable site of concern reported by the patient in the right breast at 2 o'clock 5 cm from the nipple demonstrating an irregular hypoechoic mass measuring 0.5 x 0.6 x 0.6 cm. There is associated vascularity. Targeted ultrasound of the right axilla demonstrates normal-appearing lymph nodes. IMPRESSION: Right breast mass at 2 o'clock measuring 0.6 cm is indeterminate. RECOMMENDATION: Ultrasound-guided core needle biopsy of the right breast mass at 2 o'clock. I have discussed the findings and recommendations with the patient. The patient will be scheduled for biopsy prior to leaving our office today. BI-RADS CATEGORY  4: Suspicious. Electronically Signed   By: Audie Pinto M.D.   On: 02/05/2020 16:05   MM CLIP PLACEMENT RIGHT  Result Date: 02/18/2020 CLINICAL DATA:  Post biopsy mammogram of the right breast for clip placement. EXAM: DIAGNOSTIC RIGHT MAMMOGRAM POST ULTRASOUND BIOPSY COMPARISON:  Previous exam(s). FINDINGS: Mammographic images were obtained following  ultrasound guided biopsy of a mass in the right breast at 2 o'clock. The  biopsy marking clip is in expected position at the site of biopsy. IMPRESSION: Appropriate positioning of the ribbon shaped biopsy marking clip at the site of biopsy in the upper inner right breast. Final Assessment: Post Procedure Mammograms for Marker Placement Electronically Signed   By: Ammie Ferrier M.D.   On: 02/18/2020 14:16   [place] RT BREAST BX W LOC DEV 1ST LESION IMG BX SPEC US GUIDE  Addendum Date: 02/20/2020   ADDENDUM REPORT: 02/20/2020 15:09 ADDENDUM: Pathology revealed GRADE I INVASIVE DUCTAL CARCINOMA of the RIGHT breast, 2 o'clock. This was found to be concordant by Dr. Ammie Ferrier. Pathology results were discussed with the patient by telephone. The patient reported doing well after the biopsy with tenderness at the site. Post biopsy instructions and care were reviewed and questions were answered. The patient was encouraged to call [facility] for any additional concerns. Surgical consultation has been arranged with Dr. Autumn Messing at [facility] on February 26, 2020. Pathology results reported by Stacie Acres RN on 02/20/2020. Electronically Signed   By: Ammie Ferrier M.D.   On: 02/20/2020 15:09   Result Date: 02/20/2020 CLINICAL DATA:  64 year old female presenting for ultrasound-guided biopsy of a right breast mass. EXAM: ULTRASOUND GUIDED RIGHT BREAST CORE NEEDLE BIOPSY COMPARISON:  Previous exam(s). PROCEDURE: I met with the patient and we discussed the procedure of ultrasound-guided biopsy, including benefits and alternatives. We discussed the high likelihood of a successful procedure. We discussed the risks of the procedure, including infection, bleeding, tissue injury, clip migration, and inadequate sampling. Informed written consent was given. The usual time-out protocol was performed immediately prior to the procedure. Lesion quadrant: Upper inner quadrant Using  sterile technique and 1% Lidocaine as local anesthetic, under direct ultrasound visualization, a 14 gauge spring-loaded device was used to perform biopsy of a mass in the right breast at 2 o'clock using an inferior approach. At the conclusion of the procedure a ribbon shaped tissue marker clip was deployed into the biopsy cavity. Follow up 2 view mammogram was performed and dictated separately. IMPRESSION: Ultrasound guided biopsy of a right breast mass at 2 o'clock. No apparent complications. Electronically Signed: By: Ammie Ferrier M.D. On: 02/18/2020 14:04    ASSESSMENT & PLAN:  Kelsey Simon is a 64 y.o. Caucasian female with a history of HTN, IBS   1. Malignant neoplasm of upper-inner quadrant of right breast, Stage IA, c(T1bN0M0), ER+/PR+/HER2-, Grade I  -We discussed her image findings and the biopsy results in great details. She was found to have a 0.6cm mass in the right breast and biopsy confirmed grade I invasive ductal carcinoma, ER and PR strongly positive, HER2(-)  -Given the early stage disease, she is likely a candidate for lumpectomy with sentinel LN biopsy. She is agreeable with that. She was seen by Dr. Marlou Starks and likely will proceed with surgery soon.  -Based on size of surgical tumor, I may recommend a Oncotype Dx test on the surgical sample if tumor is more than 1cm and we'll make a decision about adjuvant chemotherapy based on the Oncotype result. Written material of this test was given to her.  I suspect that this is likely low risk disease, I do not think she will need adjuvant chemotherapy.  -The risk of recurrence depends on the stage and biology of the tumor. She is early stage, with ER/PR positive and HER2 negative markers. I discussed this is the more common type of slow growing tumor.  -Adjuvant radiation is  recommended after lumpectomy to reduce her risk of local recurrence. She was to see Dr Isidore Moos this week, but per request I will refer her to [facility]  for treatment close to her home.  -Given the strong ER and PR expression in her postmenopausal status, I recommend adjuvant endocrine therapy with aromatase inhibitor with exemestane for a total of 5 years to reduce the risk of cancer recurrence. Potential benefits and side effects were discussed with patient. We also discussed the option of tamoxifen.  -She is very concerned about the side effects from antiestrogen therapy.  She states that she is more interested in quality of life than quantity of life and may not be on long term AI if she cannot tolerate it (such as severe joint pain and uncontrolled weight gain). She is willing to try AI first.  -We also discussed the breast cancer surveillance after her surgery. She will continue annual screening mammogram, self exams, and a routine office visit with lab and exam with [place]. Given her very dense breast tissue, I recommend additional screening with annual breast MRIs. I also discussed Abbreviated MRIs which have $400 out-of-pocket cost if insurance does not cover this. She is interested.  -F/u after surgery or Radiation   2. Comorbidities: HTN, IBS -Controlled on Maxzide and stomach medicine.  -She takes on Klonopin nightly  -Continue to f/u with PCP for management    3. Bone Health  -She plans to have baseline DEXA on 05/01/20  -She is very active, I encouraged her to continue.    PLAN:  -Proceed with surgery soon  -We will send her surgical sample for Oncotype if tumor size more than 1 cm, or >88m with G2-3 disease  -f/u after surgery or Radiation  -[place] I informed Dr. SIsidore Moosto cancel her appointment this week    No orders of the defined types were placed in this encounter.   All questions were answered. The patient knows to call the clinic with any problems, questions or concerns. The total time spent in the appointment was 45 minutes.     YTruitt Merle MD 03/03/2020 4:58 PM  I, AJoslyn Devon am acting as  scribe for YTruitt Merle MD.   I have reviewed the above documentation for accuracy and completeness, and I agree with the above.

## 2020-03-02 DIAGNOSIS — Z170 Estrogen receptor positive status [ER+]: Secondary | ICD-10-CM | POA: Insufficient documentation

## 2020-03-02 DIAGNOSIS — C50211 Malignant neoplasm of upper-inner quadrant of right female breast: Secondary | ICD-10-CM | POA: Insufficient documentation

## 2020-03-02 DIAGNOSIS — Z853 Personal history of malignant neoplasm of breast: Secondary | ICD-10-CM | POA: Insufficient documentation

## 2020-03-03 ENCOUNTER — Inpatient Hospital Stay: Payer: BC Managed Care – PPO | Attending: Hematology | Admitting: Hematology

## 2020-03-03 ENCOUNTER — Other Ambulatory Visit: Payer: Self-pay

## 2020-03-03 ENCOUNTER — Encounter: Payer: Self-pay | Admitting: Hematology

## 2020-03-03 DIAGNOSIS — Z8249 Family history of ischemic heart disease and other diseases of the circulatory system: Secondary | ICD-10-CM | POA: Insufficient documentation

## 2020-03-03 DIAGNOSIS — K589 Irritable bowel syndrome without diarrhea: Secondary | ICD-10-CM | POA: Diagnosis not present

## 2020-03-03 DIAGNOSIS — Z800 Family history of malignant neoplasm of digestive organs: Secondary | ICD-10-CM | POA: Insufficient documentation

## 2020-03-03 DIAGNOSIS — I10 Essential (primary) hypertension: Secondary | ICD-10-CM | POA: Insufficient documentation

## 2020-03-03 DIAGNOSIS — Z79899 Other long term (current) drug therapy: Secondary | ICD-10-CM | POA: Insufficient documentation

## 2020-03-03 DIAGNOSIS — Z87891 Personal history of nicotine dependence: Secondary | ICD-10-CM | POA: Diagnosis not present

## 2020-03-03 DIAGNOSIS — Z170 Estrogen receptor positive status [ER+]: Secondary | ICD-10-CM | POA: Insufficient documentation

## 2020-03-03 DIAGNOSIS — C50211 Malignant neoplasm of upper-inner quadrant of right female breast: Secondary | ICD-10-CM | POA: Diagnosis not present

## 2020-03-03 DIAGNOSIS — Z801 Family history of malignant neoplasm of trachea, bronchus and lung: Secondary | ICD-10-CM | POA: Insufficient documentation

## 2020-03-03 DIAGNOSIS — F419 Anxiety disorder, unspecified: Secondary | ICD-10-CM | POA: Diagnosis not present

## 2020-03-04 ENCOUNTER — Other Ambulatory Visit: Payer: Self-pay

## 2020-03-04 ENCOUNTER — Ambulatory Visit: Payer: BC Managed Care – PPO | Attending: General Surgery

## 2020-03-04 ENCOUNTER — Other Ambulatory Visit: Payer: Self-pay | Admitting: *Deleted

## 2020-03-04 ENCOUNTER — Telehealth: Payer: Self-pay | Admitting: *Deleted

## 2020-03-04 ENCOUNTER — Encounter: Payer: Self-pay | Admitting: *Deleted

## 2020-03-04 DIAGNOSIS — C50211 Malignant neoplasm of upper-inner quadrant of right female breast: Secondary | ICD-10-CM | POA: Diagnosis not present

## 2020-03-04 DIAGNOSIS — Z170 Estrogen receptor positive status [ER+]: Secondary | ICD-10-CM

## 2020-03-04 DIAGNOSIS — R293 Abnormal posture: Secondary | ICD-10-CM | POA: Insufficient documentation

## 2020-03-04 NOTE — Telephone Encounter (Signed)
Left message for a return phone call to follow up from her appointment and assess navigation needs.

## 2020-03-04 NOTE — Patient Instructions (Signed)

## 2020-03-04 NOTE — Therapy (Addendum)
[place], [place], [address] Phone: [phone]   Fax:  [phone]  Physical Therapy Evaluation  Patient Details  Name: Kelsey Simon MRN: [id number] Date of Birth: 1955-06-19 Referring Provider (PT): Dr. Autumn Messing   Encounter Date: 03/04/2020   PT End of Session - 03/04/20 1359    Visit Number 1    Number of Visits 2    Date for PT Re-Evaluation 06/30/20    PT Start Time 9741    PT Stop Time 1345    PT Time Calculation (min) 40 min    Activity Tolerance Patient tolerated treatment well    Behavior During Therapy [facility] for tasks assessed/performed           Past Medical History:  Diagnosis Date  . Hx of adenomatous colonic polyps 05/18/2018  . Hypertension   . IBS (irritable bowel syndrome)   . PONV (postoperative nausea and vomiting)   . Varicose veins     Past Surgical History:  Procedure Laterality Date  . BREAST BIOPSY Right 2017 and 2006   x 2  . BREAST EXCISIONAL BIOPSY    . BREAST LUMPECTOMY WITH RADIOACTIVE SEED LOCALIZATION Right 02/13/2016   Procedure: RIGHT BREAST LUMPECTOMY WITH RADIOACTIVE SEED LOCALIZATION;  Surgeon: Autumn Messing III, MD;  Location: [place];  Service: General;  Laterality: Right;  . COLONOSCOPY    . NASAL SEPTUM SURGERY  1990  . UPPER GASTROINTESTINAL ENDOSCOPY    . WISDOM TOOTH EXTRACTION      There were no vitals filed for this visit.    Subjective Assessment - 03/04/20 1305    Subjective diagnosed 02/19/20 with stage 1 small IDC.  Will have SLNB to be safe.  Will have radiation after surgery for 4-6 weeks.Hasn't got surgery scheduled yet.    Pertinent History prior right lumpectomies(benign)    Currently in Pain? No/denies    Multiple Pain Sites No              [facility] PT Assessment - 03/04/20 0001      Assessment   Medical Diagnosis IDC    Referring Provider (PT) Dr. Autumn Messing    Onset Date/Surgical Date 02/19/20    Hand  Dominance Right      Precautions   Precautions --   active Cancer   Precaution Comments active CA      Restrictions   Weight Bearing Restrictions No    Other Position/Activity Restrictions --      Balance Screen   Has the patient fallen in the past 6 months No    Has the patient had a decrease in activity level because of a fear of falling?  No    Is the patient reluctant to leave their home because of a fear of falling?  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     Cognition   Overall Cognitive Status Within Functional Limits for tasks assessed      Observation/Other Assessments   Observations --   WNL     Observation/Other Assessments-Edema    Edema --   none     Posture/Postural  Control   Posture/Postural Control Postural limitations    Postural Limitations --   round shoulder     AROM   AROM Assessment Site Shoulder    Right/Left Shoulder Right;Left    Right Shoulder Extension 45 Degrees    Right Shoulder Flexion 140 Degrees    Right Shoulder ABduction 178 Degrees    Right Shoulder Internal Rotation 55 Degrees    Right Shoulder External Rotation 87 Degrees    Left Shoulder Extension 50 Degrees    Left Shoulder Flexion 140 Degrees    Left Shoulder ABduction 160 Degrees    Left Shoulder Internal Rotation 73 Degrees    Left Shoulder External Rotation 80 Degrees             LYMPHEDEMA/ONCOLOGY QUESTIONNAIRE - 03/04/20 0001      Type   Cancer Type Right breast cancer      Right Upper Extremity Lymphedema   10 cm Proximal to Olecranon Process 26 cm    Olecranon Process 24.9 cm    10 cm Proximal to Ulnar Styloid Process 21.4 cm    Just Proximal to Ulnar Styloid Process 16.6  cm    Across Hand at [employer] 19.8 cm    At [place] of 2nd Digit 6.4 cm      Left Upper Extremity Lymphedema   10 cm Proximal to Olecranon Process 26.5 cm    Olecranon Process 24.7 cm    10 cm Proximal to Ulnar Styloid Process 20.4 cm    Just Proximal to Ulnar Styloid Process 17 cm    Across Hand at [employer] 19.5 cm    At [place] of 2nd Digit 6.4 cm           L-DEX FLOWSHEETS - 03/04/20 1400      L-DEX LYMPHEDEMA SCREENING   Measurement Type Unilateral    L-DEX MEASUREMENT EXTREMITY Upper Extremity    POSITION  Standing    DOMINANT SIDE Right    At Risk Side Right    BASELINE SCORE (UNILATERAL) 0.8                  Objective measurements completed on examination: See above findings.               PT Education - 03/04/20 1356    Education Details Pt. was instructed in lympedema precautions, importance of attending ABC class, and HEP for gentle shoulder ROM.  See above    Person(s) Educated Patient    Methods Demonstration;Explanation;Handout    Comprehension Returned demonstration;Verbalized understanding               PT Long Term Goals - 03/04/20 1418      PT LONG TERM GOAL #1   Title Patient will demonstrate she has regained full shoulder ROM and function postoperatively compared to baseline    Time 8    Period Weeks    Status New    Target Date 04/29/20           Breast Clinic Goals - 03/04/20 1415      Patient will be able to verbalize understanding of pertinent lymphedema risk reduction practices relevant to her diagnosis specifically related to skin care.   Time 1    Period Days    Status Achieved      Patient will be able to return demonstrate and/or verbalize understanding of the post-op home exercise program related to regaining shoulder range of motion.   Time 1  Period Days    Status Achieved      Patient will be able to verbalize understanding of the importance of attending the postoperative After Breast  Cancer Class for further lymphedema risk reduction education and therapeutic exercise.   Time 1    Period Days    Status Achieved                 Plan - 03/04/20 1423    Clinical Impression Statement Pt. presents prior to lumpectomy surgery to get information regarding post op exercises, information about  lymphedema and, ABC class, and baseline screen for [place] .    Stability/Clinical Decision Making Stable/Uncomplicated    Clinical Decision Making Low    Rehab Potential Excellent    PT Frequency Other (comment)   2 visits for Pre and post op assessment with more visits to be added if needed.   PT Duration 8 weeks    PT Treatment/Interventions Therapeutic exercise;Patient/family education;ADLs/Self Care Home Management    PT Next Visit Plan Reassess baselines post op for any deficits    PT Home Exercise Plan educated in post op stretches    Consulted and Agree with Plan of Care Patient           Patient will benefit from skilled therapeutic intervention in order to improve the following deficits and impairments:  Postural dysfunction  Visit Diagnosis: Abnormal posture - Plan: PT plan of care cert/re-cert  Malignant neoplasm of upper-inner quadrant of right breast in female, estrogen receptor positive ([place]) - Plan: PT plan of care cert/re-cert     Problem List Patient Active Problem List   Diagnosis Date Noted  . Malignant neoplasm of upper-inner quadrant of right breast in female, estrogen receptor positive ([place]) 03/02/2020  . Encounter for screening mammogram for malignant neoplasm of breast 01/20/2020  . Screening for osteoporosis 01/20/2020  . Hx of adenomatous colonic polyps 05/18/2018  . 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 Anxiety 01/04/2012  . Essential hypertension     Annia Friendly, PT 03/04/2020, 7:10 PM  [place], [place], [address] Phone: [phone]   Fax:   [phone]  Name: Kelsey Simon MRN: [id number] Date of Birth: 10/26/1955

## 2020-03-05 ENCOUNTER — Ambulatory Visit: Payer: BC Managed Care – PPO

## 2020-03-05 ENCOUNTER — Telehealth: Payer: Self-pay | Admitting: Hematology

## 2020-03-05 ENCOUNTER — Ambulatory Visit: Payer: BC Managed Care – PPO | Admitting: Radiation Oncology

## 2020-03-05 NOTE — Telephone Encounter (Signed)
No 10/4 los °

## 2020-03-07 ENCOUNTER — Ambulatory Visit: Payer: Self-pay | Admitting: General Surgery

## 2020-03-07 DIAGNOSIS — C50211 Malignant neoplasm of upper-inner quadrant of right female breast: Secondary | ICD-10-CM

## 2020-03-07 DIAGNOSIS — Z170 Estrogen receptor positive status [ER+]: Secondary | ICD-10-CM

## 2020-03-10 ENCOUNTER — Encounter: Payer: Self-pay | Admitting: *Deleted

## 2020-03-12 ENCOUNTER — Institutional Professional Consult (permissible substitution): Payer: BC Managed Care – PPO | Admitting: Radiation Oncology

## 2020-03-13 ENCOUNTER — Other Ambulatory Visit: Payer: Self-pay

## 2020-03-13 ENCOUNTER — Other Ambulatory Visit: Payer: Self-pay | Admitting: General Surgery

## 2020-03-13 ENCOUNTER — Encounter: Payer: Self-pay | Admitting: Radiation Oncology

## 2020-03-13 ENCOUNTER — Ambulatory Visit
Admission: RE | Admit: 2020-03-13 | Discharge: 2020-03-13 | Disposition: A | Discharge status: Home / Self Care | Payer: BC Managed Care – PPO | Source: Ambulatory Visit | Attending: Radiation Oncology | Admitting: Radiation Oncology

## 2020-03-13 VITALS — BP 173/101 | HR 77 | Temp 96.7°F | Resp 16 | Wt 135.1 lb

## 2020-03-13 DIAGNOSIS — I10 Essential (primary) hypertension: Secondary | ICD-10-CM | POA: Insufficient documentation

## 2020-03-13 DIAGNOSIS — Z87891 Personal history of nicotine dependence: Secondary | ICD-10-CM | POA: Diagnosis not present

## 2020-03-13 DIAGNOSIS — Z170 Estrogen receptor positive status [ER+]: Secondary | ICD-10-CM

## 2020-03-13 DIAGNOSIS — Z79899 Other long term (current) drug therapy: Secondary | ICD-10-CM | POA: Insufficient documentation

## 2020-03-13 DIAGNOSIS — Z86010 Personal history of colonic polyps: Secondary | ICD-10-CM | POA: Insufficient documentation

## 2020-03-13 DIAGNOSIS — C50211 Malignant neoplasm of upper-inner quadrant of right female breast: Secondary | ICD-10-CM | POA: Diagnosis not present

## 2020-03-13 DIAGNOSIS — K589 Irritable bowel syndrome without diarrhea: Secondary | ICD-10-CM | POA: Diagnosis not present

## 2020-03-13 NOTE — Consult Note (Signed)
NEW PATIENT EVALUATION  Name: Kelsey Simon  MRN: [id number]  Date:   03/13/2020     DOB: 1956-05-22   This 64 y.o. 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  REFERRING PHYSICIAN: Marval Regal, NP  CHIEF COMPLAINT:  Chief Complaint  Patient presents with  . Breast Cancer    Initial consultation    DIAGNOSIS: The encounter diagnosis was Malignant neoplasm of upper-inner quadrant of right breast in female, estrogen receptor positive ([place]).   PREVIOUS INVESTIGATIONS:  Mammogram and ultrasound reviewed Pathology report reviewed Clinical notes reviewed  HPI: Patient is a 64 year old female who is status post wide local excision for benign fibrocystic mass back in 2017.  She has felt some abnormality in her breast for the past year or 2 and in September mammogram and ultrasound confirmed a 0.5 x 0.6 x 0.6 cm mass at the 2 o'clock position 5 cm from the nipple which was also hypoechoic worrisome for malignancy.  Axilla was normal.  She underwent a ultrasound-guided biopsy which was positive for a grade 1 invasive ductal carcinoma ER/PR positive HER-2/neu not overexpressed.  Tumor dimension the specimen is 0.7 cm in greatest dimension.  She is pending her surgery now which will be in mid November and is now seen for radiation oncology consultation she is doing well she specifically denies breast tenderness cough or bone pain.  PLANNED TREATMENT REGIMEN: Hypofractionated right whole breast radiation  PAST MEDICAL HISTORY:  has a past medical history of adenomatous colonic polyps (05/18/2018), Hypertension, IBS (irritable bowel syndrome), PONV (postoperative nausea and vomiting), and Varicose veins.    PAST SURGICAL HISTORY:  Past Surgical History:  Procedure Laterality Date  . BREAST BIOPSY Right 2017 and 2006   x 2   . BREAST EXCISIONAL BIOPSY    . BREAST LUMPECTOMY WITH RADIOACTIVE SEED LOCALIZATION Right 02/13/2016   Procedure: RIGHT BREAST LUMPECTOMY WITH RADIOACTIVE SEED LOCALIZATION;  Surgeon: Autumn Messing III, MD;  Location: [place];  Service: General;  Laterality: Right;  . COLONOSCOPY    . NASAL SEPTUM SURGERY  1990  . UPPER GASTROINTESTINAL ENDOSCOPY    . WISDOM TOOTH EXTRACTION      FAMILY HISTORY: family history includes Cancer in her father; Colon cancer in her paternal grandfather; Drug abuse in her daughter; Heart attack in her father; Lung cancer in her father.  SOCIAL HISTORY:  reports that she quit smoking about 26 years ago. She has a 5.00 pack-year smoking history. She has never used smokeless tobacco. She reports current alcohol use of about 7.0 standard drinks of alcohol per week. She reports that she does not use drugs.  ALLERGIES: Penicillins  MEDICATIONS:  Current Outpatient Medications  Medication Sig Dispense Refill  . BIOTIN PO Take by mouth daily.    . cholecalciferol (VITAMIN D3) 25 MCG (1000 UT) tablet Take 1,000 Units by mouth daily.    . clonazePAM (KLONOPIN) 0.5 MG tablet TAKE ONE-HALF TABLET BY MOUTH AS NEEDED FOR SLEEP 30 tablet 2  . Magnesium 250 MG TABS Take by mouth.    . Multiple Vitamin (MULTIVITAMIN) tablet Take 1 tablet by mouth daily.    Marland Kitchen triamterene-hydrochlorothiazide (MAXZIDE) 75-50 MG tablet Take 1 tablet by mouth daily. 90 tablet 3   No current facility-administered medications for this encounter.    ECOG PERFORMANCE STATUS:  0 - Asymptomatic  REVIEW OF SYSTEMS: Patient  denies any weight loss, fatigue, weakness, fever, chills or night sweats. Patient denies any loss of vision, blurred vision. Patient denies any ringing  of the ears or hearing loss. No irregular heartbeat. Patient denies heart murmur or history of fainting. Patient denies any chest pain or pain radiating to her upper extremities. Patient denies any shortness of breath,  difficulty breathing at night, cough or hemoptysis. Patient denies any swelling in the lower legs. Patient denies any nausea vomiting, vomiting of blood, or coffee ground material in the vomitus. Patient denies any stomach pain. Patient states has had normal bowel movements no significant constipation or diarrhea. Patient denies any dysuria, hematuria or significant nocturia. Patient denies any problems walking, swelling in the joints or loss of balance. Patient denies any skin changes, loss of hair or loss of weight. Patient denies any excessive worrying or anxiety or significant depression. Patient denies any problems with insomnia. Patient denies excessive thirst, polyuria, polydipsia. Patient denies any swollen glands, patient denies easy bruising or easy bleeding. Patient denies any recent infections, allergies or URI. Patient "s visual fields have not changed significantly in recent time.   PHYSICAL EXAM: BP (!) 173/101 (BP Location: Left Arm, Patient Position: Sitting)   Pulse 77   Temp (!) 96.7 F (35.9 C) (Tympanic)   Resp 16   Wt 135 lb 1.6 oz (61.3 kg)   BMI 21.97 kg/m  She has an old circumferential incision in the nipple areolar complex of the right which is well-healed.  No dominant mass or nodularity is noted in either breast in 2 positions examined.  No axillary or supraclavicular adenopathy is noted.  Well-developed well-nourished patient in NAD. HEENT reveals PERLA, EOMI, discs not visualized.  Oral cavity is clear. No oral mucosal lesions are identified. Neck is clear without evidence of cervical or supraclavicular adenopathy. Lungs are clear to A&P. Cardiac examination is essentially unremarkable with regular rate and rhythm without murmur rub or thrill. Abdomen is benign with no organomegaly or masses noted. Motor sensory and DTR levels are equal and symmetric in the upper and lower extremities. Cranial nerves II through XII are grossly intact. Proprioception is intact. No peripheral  adenopathy or edema is identified. No motor or sensory levels are noted. Crude visual fields are within normal range.  LABORATORY DATA: Pathology report reviewed    RADIOLOGY RESULTS: Mammogram and ultrasound reviewed compatible with above-stated findings   IMPRESSION: Probable stage Ia (T1b N0 M0) ER/PR positive invasive mammary carcinoma the right breast status post biopsy pending wide local excision and sentinel node biopsy  PLAN: This time we will make recommendations based on her final pathology after her wide local excision and sentinel node biopsy in November.  Should this poor out to be a's stage Ia lesion would offer whole breast radiation hypofractionated course over 3 weeks.  Would also probably boost her scar another 1000 cGy using electron beam in 5 fractions.  Risks and benefits of treatment occluding skin reaction fatigue alteration blood counts possible inclusion of superficial lung all were described in detail to the patient.  I will see her in follow-up in about a week or 2 after her surgery and start planning for her simulation.  Patient comprehends my recommendations and treatment plan well.  I would like to take this opportunity to thank you for allowing me to participate in the care of your patient.Noreene Filbert, MD

## 2020-03-14 DIAGNOSIS — C50911 Malignant neoplasm of unspecified site of right female breast: Secondary | ICD-10-CM | POA: Diagnosis not present

## 2020-03-17 DIAGNOSIS — C50211 Malignant neoplasm of upper-inner quadrant of right female breast: Secondary | ICD-10-CM | POA: Diagnosis not present

## 2020-03-20 ENCOUNTER — Encounter: Payer: Self-pay | Admitting: *Deleted

## 2020-04-01 ENCOUNTER — Other Ambulatory Visit: Payer: Self-pay

## 2020-04-01 ENCOUNTER — Encounter: Payer: Self-pay | Admitting: Plastic Surgery

## 2020-04-01 ENCOUNTER — Ambulatory Visit: Payer: BC Managed Care – PPO | Admitting: Plastic Surgery

## 2020-04-01 VITALS — BP 155/82 | Temp 97.3°F | Ht 67.0 in | Wt 136.4 lb

## 2020-04-01 DIAGNOSIS — Z170 Estrogen receptor positive status [ER+]: Secondary | ICD-10-CM | POA: Diagnosis not present

## 2020-04-01 DIAGNOSIS — C50211 Malignant neoplasm of upper-inner quadrant of right female breast: Secondary | ICD-10-CM | POA: Diagnosis not present

## 2020-04-01 NOTE — Progress Notes (Signed)
Patient ID: Kelsey Simon, female    DOB: Oct 10, 1955, 64 y.o.   MRN: [id number]   Chief Complaint  Patient presents with  . Consult  . Breast Cancer    The patient is a 64 year old female here for a breast reconstruction consult.  She is seeing Dr. Marlou Starks for right-sided breast cancer.  She had a mammogram and was found to have lesion of the right upper inner quadrant.  It was positive for right breast invasive ductal carcinoma grade 1.  It is estrogen and progesterone positive, HER-2 negative with Ki-67 at 5%. She is 5 feet 7 inches tall and weighs 136 pounds her preoperative cup size is in A cup.  Approximately 4 years ago she had a lesion in the upper quadrant of the right breast.  She underwent excision by Dr. Marlou Starks and it was found to be benign.  She has not had any radiation.  She is most likely going to have a partial mastectomy with radiation for this lesion.  She likes her breast size and does not really want them any larger.   Review of Systems  Constitutional: Negative.  Negative for activity change.  HENT: Negative.   Eyes: Negative.   Respiratory: Negative.  Negative for chest tightness and shortness of breath.   Cardiovascular: Negative for leg swelling.  Gastrointestinal: Negative for abdominal distention and abdominal pain.  Endocrine: Negative.   Genitourinary: Negative.   Musculoskeletal: Negative.   Neurological: Negative.   Hematological: Negative.   Psychiatric/Behavioral: Negative.     Past Medical History:  Diagnosis Date  . Hx of adenomatous colonic polyps 05/18/2018  . Hypertension   . IBS (irritable bowel syndrome)   . PONV (postoperative nausea and vomiting)   . Varicose veins     Past Surgical History:  Procedure Laterality Date  . BREAST BIOPSY Right 2017 and 2006   x 2  . BREAST EXCISIONAL BIOPSY    . BREAST LUMPECTOMY WITH RADIOACTIVE SEED LOCALIZATION Right 02/13/2016   Procedure: RIGHT BREAST LUMPECTOMY WITH RADIOACTIVE SEED LOCALIZATION;   Surgeon: Autumn Messing III, MD;  Location: [place];  Service: General;  Laterality: Right;  . COLONOSCOPY    . NASAL SEPTUM SURGERY  1990  . UPPER GASTROINTESTINAL ENDOSCOPY    . WISDOM TOOTH EXTRACTION        Current Outpatient Medications:  .  BIOTIN PO, Take by mouth daily., Disp: , Rfl:  .  cholecalciferol (VITAMIN D3) 25 MCG (1000 UT) tablet, Take 1,000 Units by mouth daily., Disp: , Rfl:  .  Magnesium 250 MG TABS, Take by mouth., Disp: , Rfl:  .  Multiple Vitamin (MULTIVITAMIN) tablet, Take 1 tablet by mouth daily., Disp: , Rfl:  .  triamterene-hydrochlorothiazide (MAXZIDE) 75-50 MG tablet, Take 1 tablet by mouth daily., Disp: 90 tablet, Rfl: 3 .  clonazePAM (KLONOPIN) 0.5 MG tablet, TAKE ONE-HALF TABLET BY MOUTH AS NEEDED FOR SLEEP (Patient not taking: Reported on 04/01/2020), Disp: 30 tablet, Rfl: 2   Objective:   Vitals:   04/01/20 1002  BP: (!) 155/82  Temp: (!) 97.3 F (36.3 C)    Physical Exam Vitals and nursing note reviewed.  Constitutional:      Appearance: Normal appearance.  HENT:     Head: Normocephalic and atraumatic.  Eyes:     Extraocular Movements: Extraocular movements intact.  Cardiovascular:     Rate and Rhythm: Normal rate.     Pulses: Normal pulses.  Pulmonary:     Effort: Pulmonary  effort is normal. No respiratory distress.  Abdominal:     General: Abdomen is flat. There is no distension.     Tenderness: There is no abdominal tenderness.  Skin:    General: Skin is warm.     Capillary Refill: Capillary refill takes less than 2 seconds.  Neurological:     General: No focal deficit present.     Mental Status: She is alert and oriented to person, place, and time.  Psychiatric:        Mood and Affect: Mood normal.        Behavior: Behavior normal.     Assessment & Plan:  Malignant neoplasm of upper-inner quadrant of right breast in female, estrogen receptor positive ([place])  We discussed the options for autologous versus  implant-based reconstruction.  We'll hold it down to the patient's particular situation.  She is interested in a partial mastectomy of the right breast.  With this in mind I think she should go ahead with the partial mastectomy and radiation.  She should see me a few months after she has finished.  At any point I invited her to call me if she needs emotional support or a review of the plan.  I do not want her getting discouraged on the way that it looks because we can get her back to looking even especially in close.  We can have her come back to the clinic for another visit or do a telemetry visit, which ever works best for her.  The main options for fat grafting if needed and or implant placement inf the right breast.  I have discussed the above information with Dr. Marlou Starks and have reviewed her chart personally. Pictures were obtained of the patient and placed in the chart with the patient's or guardian's permission.   [place], DO

## 2020-04-08 ENCOUNTER — Other Ambulatory Visit: Payer: Self-pay

## 2020-04-08 ENCOUNTER — Encounter (HOSPITAL_BASED_OUTPATIENT_CLINIC_OR_DEPARTMENT_OTHER): Payer: Self-pay | Admitting: General Surgery

## 2020-04-12 ENCOUNTER — Other Ambulatory Visit (HOSPITAL_COMMUNITY): Payer: BC Managed Care – PPO

## 2020-04-14 ENCOUNTER — Encounter (HOSPITAL_BASED_OUTPATIENT_CLINIC_OR_DEPARTMENT_OTHER)
Admission: RE | Admit: 2020-04-14 | Discharge: 2020-04-14 | Disposition: A | Discharge status: Home / Self Care | Payer: BC Managed Care – PPO | Source: Ambulatory Visit | Attending: General Surgery | Admitting: General Surgery

## 2020-04-14 ENCOUNTER — Other Ambulatory Visit (HOSPITAL_COMMUNITY)
Admission: RE | Admit: 2020-04-14 | Discharge: 2020-04-14 | Disposition: A | Discharge status: Home / Self Care | Payer: BC Managed Care – PPO | Source: Ambulatory Visit | Attending: General Surgery | Admitting: General Surgery

## 2020-04-14 DIAGNOSIS — Z20822 Contact with and (suspected) exposure to covid-19: Secondary | ICD-10-CM | POA: Insufficient documentation

## 2020-04-14 DIAGNOSIS — Z87891 Personal history of nicotine dependence: Secondary | ICD-10-CM | POA: Diagnosis not present

## 2020-04-14 DIAGNOSIS — Z79899 Other long term (current) drug therapy: Secondary | ICD-10-CM | POA: Diagnosis not present

## 2020-04-14 DIAGNOSIS — C50211 Malignant neoplasm of upper-inner quadrant of right female breast: Secondary | ICD-10-CM | POA: Diagnosis not present

## 2020-04-14 DIAGNOSIS — Z170 Estrogen receptor positive status [ER+]: Secondary | ICD-10-CM | POA: Diagnosis not present

## 2020-04-14 DIAGNOSIS — Z01812 Encounter for preprocedural laboratory examination: Secondary | ICD-10-CM | POA: Insufficient documentation

## 2020-04-14 LAB — BASIC METABOLIC PANEL
Anion gap: 10 (ref 5–15)
BUN: 9 mg/dL (ref 8–23)
CO2: 26 mmol/L (ref 22–32)
Calcium: 9.3 mg/dL (ref 8.9–10.3)
Chloride: 98 mmol/L (ref 98–111)
Creatinine, Ser: 0.74 mg/dL (ref 0.44–1.00)
GFR, Estimated: >60 mL/min (ref >60)
Glucose, Bld: 101 mg/dL — ABNORMAL HIGH (ref 70–99)
Potassium: 4.1 mmol/L (ref 3.5–5.1)
Sodium: 134 mmol/L — ABNORMAL LOW (ref 135–145)

## 2020-04-14 LAB — SARS CORONAVIRUS 2 (TAT 6-24 HRS): SARS Coronavirus 2: NEGATIVE

## 2020-04-14 NOTE — Progress Notes (Signed)

## 2020-04-15 ENCOUNTER — Other Ambulatory Visit: Payer: Self-pay

## 2020-04-15 ENCOUNTER — Ambulatory Visit
Admission: RE | Admit: 2020-04-15 | Discharge: 2020-04-15 | Disposition: A | Discharge status: Home / Self Care | Payer: BC Managed Care – PPO | Source: Ambulatory Visit | Attending: General Surgery | Admitting: General Surgery

## 2020-04-15 DIAGNOSIS — Z170 Estrogen receptor positive status [ER+]: Secondary | ICD-10-CM

## 2020-04-15 DIAGNOSIS — C50211 Malignant neoplasm of upper-inner quadrant of right female breast: Secondary | ICD-10-CM

## 2020-04-15 DIAGNOSIS — C50911 Malignant neoplasm of unspecified site of right female breast: Secondary | ICD-10-CM | POA: Diagnosis not present

## 2020-04-16 ENCOUNTER — Ambulatory Visit (HOSPITAL_BASED_OUTPATIENT_CLINIC_OR_DEPARTMENT_OTHER)
Admission: RE | Admit: 2020-04-16 | Discharge: 2020-04-16 | Disposition: A | Discharge status: Home / Self Care | Payer: BC Managed Care – PPO | Source: Ambulatory Visit | Attending: General Surgery | Admitting: General Surgery

## 2020-04-16 ENCOUNTER — Encounter (HOSPITAL_COMMUNITY)
Admission: RE | Admit: 2020-04-16 | Discharge: 2020-04-16 | Disposition: A | Discharge status: Home / Self Care | Payer: BC Managed Care – PPO | Source: Ambulatory Visit | Attending: General Surgery | Admitting: General Surgery

## 2020-04-16 ENCOUNTER — Encounter (HOSPITAL_BASED_OUTPATIENT_CLINIC_OR_DEPARTMENT_OTHER)
Admission: RE | Disposition: A | Discharge status: Home / Self Care | Payer: Self-pay | Source: Ambulatory Visit | Attending: General Surgery

## 2020-04-16 ENCOUNTER — Ambulatory Visit (HOSPITAL_BASED_OUTPATIENT_CLINIC_OR_DEPARTMENT_OTHER): Payer: BC Managed Care – PPO | Admitting: Certified Registered"

## 2020-04-16 ENCOUNTER — Encounter (HOSPITAL_BASED_OUTPATIENT_CLINIC_OR_DEPARTMENT_OTHER): Payer: Self-pay | Admitting: General Surgery

## 2020-04-16 ENCOUNTER — Ambulatory Visit
Admission: RE | Admit: 2020-04-16 | Discharge: 2020-04-16 | Disposition: A | Discharge status: Home / Self Care | Payer: BC Managed Care – PPO | Source: Ambulatory Visit | Attending: General Surgery | Admitting: General Surgery

## 2020-04-16 ENCOUNTER — Other Ambulatory Visit: Payer: Self-pay

## 2020-04-16 DIAGNOSIS — Z170 Estrogen receptor positive status [ER+]: Secondary | ICD-10-CM

## 2020-04-16 DIAGNOSIS — C50211 Malignant neoplasm of upper-inner quadrant of right female breast: Secondary | ICD-10-CM | POA: Insufficient documentation

## 2020-04-16 DIAGNOSIS — C50911 Malignant neoplasm of unspecified site of right female breast: Secondary | ICD-10-CM | POA: Diagnosis not present

## 2020-04-16 DIAGNOSIS — N6489 Other specified disorders of breast: Secondary | ICD-10-CM | POA: Diagnosis not present

## 2020-04-16 DIAGNOSIS — Z20822 Contact with and (suspected) exposure to covid-19: Secondary | ICD-10-CM | POA: Diagnosis not present

## 2020-04-16 DIAGNOSIS — G8918 Other acute postprocedural pain: Secondary | ICD-10-CM | POA: Diagnosis not present

## 2020-04-16 DIAGNOSIS — Z171 Estrogen receptor negative status [ER-]: Secondary | ICD-10-CM | POA: Diagnosis not present

## 2020-04-16 DIAGNOSIS — Z79899 Other long term (current) drug therapy: Secondary | ICD-10-CM | POA: Insufficient documentation

## 2020-04-16 DIAGNOSIS — Z87891 Personal history of nicotine dependence: Secondary | ICD-10-CM | POA: Diagnosis not present

## 2020-04-16 DIAGNOSIS — I10 Essential (primary) hypertension: Secondary | ICD-10-CM | POA: Diagnosis not present

## 2020-04-16 HISTORY — PX: BREAST LUMPECTOMY WITH RADIOACTIVE SEED AND SENTINEL LYMPH NODE BIOPSY: SHX6550

## 2020-04-16 SURGERY — BREAST LUMPECTOMY WITH RADIOACTIVE SEED AND SENTINEL LYMPH NODE BIOPSY
Anesthesia: General | Site: Breast | Laterality: Right

## 2020-04-16 MED ORDER — ACETAMINOPHEN 500 MG PO TABS
ORAL_TABLET | ORAL | Status: AC
  Filled 2020-04-16: qty 2

## 2020-04-16 MED ORDER — HYDROMORPHONE HCL 1 MG/ML IJ SOLN: 0.2500 mg | INTRAMUSCULAR | Status: DC | PRN

## 2020-04-16 MED ORDER — ROPIVACAINE HCL 5 MG/ML IJ SOLN
INTRAMUSCULAR | Status: DC | PRN
  Administered 2020-04-16: 30 mL via PERINEURAL

## 2020-04-16 MED ORDER — FENTANYL CITRATE (PF) 100 MCG/2ML IJ SOLN
INTRAMUSCULAR | Status: AC
  Filled 2020-04-16: qty 2

## 2020-04-16 MED ORDER — MIDAZOLAM HCL 2 MG/2ML IJ SOLN
2.0000 mg | Freq: Once | INTRAMUSCULAR | Status: AC
  Administered 2020-04-16: 2 mg via INTRAVENOUS

## 2020-04-16 MED ORDER — DROPERIDOL 2.5 MG/ML IJ SOLN
INTRAMUSCULAR | Status: AC
  Filled 2020-04-16: qty 2

## 2020-04-16 MED ORDER — PROMETHAZINE HCL 25 MG/ML IJ SOLN: 6.2500 mg | INTRAMUSCULAR | Status: DC | PRN

## 2020-04-16 MED ORDER — TECHNETIUM TC 99M TILMANOCEPT KIT
1.0000 | PACK | Freq: Once | INTRAVENOUS | Status: AC | PRN
  Administered 2020-04-16: 1 via INTRADERMAL

## 2020-04-16 MED ORDER — HYDROCODONE-ACETAMINOPHEN 5-325 MG PO TABS: 1.0000 | ORAL_TABLET | Freq: Four times a day (QID) | ORAL | 0 refills | Status: DC | PRN

## 2020-04-16 MED ORDER — OXYCODONE HCL 5 MG/5ML PO SOLN: 5.0000 mg | Freq: Once | ORAL | Status: DC | PRN

## 2020-04-16 MED ORDER — LACTATED RINGERS IV SOLN: INTRAVENOUS | Status: DC

## 2020-04-16 MED ORDER — ONDANSETRON HCL 4 MG/2ML IJ SOLN
INTRAMUSCULAR | Status: DC | PRN
  Administered 2020-04-16: 4 mg via INTRAVENOUS

## 2020-04-16 MED ORDER — EPHEDRINE SULFATE 50 MG/ML IJ SOLN
INTRAMUSCULAR | Status: DC | PRN
  Administered 2020-04-16: 5 mg via INTRAVENOUS
  Administered 2020-04-16: 10 mg via INTRAVENOUS

## 2020-04-16 MED ORDER — CHLORHEXIDINE GLUCONATE CLOTH 2 % EX PADS: 6.0000 | MEDICATED_PAD | Freq: Once | CUTANEOUS | Status: DC

## 2020-04-16 MED ORDER — OXYCODONE HCL 5 MG PO TABS: 5.0000 mg | ORAL_TABLET | Freq: Once | ORAL | Status: DC | PRN

## 2020-04-16 MED ORDER — MIDAZOLAM HCL 2 MG/2ML IJ SOLN
INTRAMUSCULAR | Status: AC
  Filled 2020-04-16: qty 2

## 2020-04-16 MED ORDER — BUPIVACAINE HCL (PF) 0.25 % IJ SOLN
INTRAMUSCULAR | Status: DC | PRN
  Administered 2020-04-16: 23 mL

## 2020-04-16 MED ORDER — GABAPENTIN 300 MG PO CAPS
ORAL_CAPSULE | ORAL | Status: AC
  Filled 2020-04-16: qty 1

## 2020-04-16 MED ORDER — FENTANYL CITRATE (PF) 100 MCG/2ML IJ SOLN
INTRAMUSCULAR | Status: DC | PRN
  Administered 2020-04-16: 50 ug via INTRAVENOUS

## 2020-04-16 MED ORDER — PROPOFOL 10 MG/ML IV BOLUS
INTRAVENOUS | Status: DC | PRN
  Administered 2020-04-16: 150 mg via INTRAVENOUS

## 2020-04-16 MED ORDER — PROPOFOL 500 MG/50ML IV EMUL
INTRAVENOUS | Status: DC | PRN
  Administered 2020-04-16: 50 ug/kg/min via INTRAVENOUS

## 2020-04-16 MED ORDER — FENTANYL CITRATE (PF) 100 MCG/2ML IJ SOLN
100.0000 ug | Freq: Once | INTRAMUSCULAR | Status: AC
  Administered 2020-04-16: 100 ug via INTRAVENOUS

## 2020-04-16 MED ORDER — ACETAMINOPHEN 500 MG PO TABS
1000.0000 mg | ORAL_TABLET | ORAL | Status: AC
  Administered 2020-04-16: 1000 mg via ORAL

## 2020-04-16 MED ORDER — DROPERIDOL 2.5 MG/ML IJ SOLN
INTRAMUSCULAR | Status: DC | PRN
  Administered 2020-04-16: .625 mg via INTRAVENOUS

## 2020-04-16 MED ORDER — LIDOCAINE HCL (CARDIAC) PF 100 MG/5ML IV SOSY
PREFILLED_SYRINGE | INTRAVENOUS | Status: DC | PRN
  Administered 2020-04-16: 30 mg via INTRAVENOUS

## 2020-04-16 MED ORDER — CEFAZOLIN SODIUM-DEXTROSE 2-4 GM/100ML-% IV SOLN
INTRAVENOUS | Status: AC
  Filled 2020-04-16: qty 100

## 2020-04-16 MED ORDER — GABAPENTIN 300 MG PO CAPS
300.0000 mg | ORAL_CAPSULE | ORAL | Status: AC
  Administered 2020-04-16: 300 mg via ORAL

## 2020-04-16 MED ORDER — CELECOXIB 200 MG PO CAPS
200.0000 mg | ORAL_CAPSULE | ORAL | Status: AC
  Administered 2020-04-16: 200 mg via ORAL

## 2020-04-16 MED ORDER — MEPERIDINE HCL 25 MG/ML IJ SOLN: 6.2500 mg | INTRAMUSCULAR | Status: DC | PRN

## 2020-04-16 MED ORDER — DEXAMETHASONE SODIUM PHOSPHATE 4 MG/ML IJ SOLN
INTRAMUSCULAR | Status: DC | PRN
  Administered 2020-04-16: 4 mg via INTRAVENOUS

## 2020-04-16 MED ORDER — CELECOXIB 200 MG PO CAPS
ORAL_CAPSULE | ORAL | Status: AC
  Filled 2020-04-16: qty 1

## 2020-04-16 MED ORDER — CEFAZOLIN SODIUM-DEXTROSE 2-4 GM/100ML-% IV SOLN
2.0000 g | INTRAVENOUS | Status: AC
  Administered 2020-04-16: 2 g via INTRAVENOUS

## 2020-04-16 SURGICAL SUPPLY — 43 items
ADH SKN CLS APL DERMABOND .7 (GAUZE/BANDAGES/DRESSINGS) ×2
APL PRP STRL LF DISP 70% ISPRP (MISCELLANEOUS) ×2
APPLIER CLIP 9.375 MED OPEN (MISCELLANEOUS)
APR CLP MED 9.3 20 MLT OPN (MISCELLANEOUS)
BLADE SURG 15 STRL LF DISP TIS (BLADE) ×2 IMPLANT
BLADE SURG 15 STRL SS (BLADE) ×3
CANISTER SUC SOCK COL 7IN (MISCELLANEOUS) ×3 IMPLANT
CANISTER SUCT 1200ML W/VALVE (MISCELLANEOUS) ×3 IMPLANT
CHLORAPREP W/TINT 26 (MISCELLANEOUS) ×3 IMPLANT
CLIP APPLIE 9.375 MED OPEN (MISCELLANEOUS) IMPLANT
COVER BACK TABLE 60X90IN (DRAPES) ×3 IMPLANT
COVER MAYO STAND STRL (DRAPES) ×3 IMPLANT
COVER PROBE W GEL 5X96 (DRAPES) ×3 IMPLANT
COVER WAND RF STERILE (DRAPES) IMPLANT
DECANTER SPIKE VIAL GLASS SM (MISCELLANEOUS) IMPLANT
DERMABOND ADVANCED (GAUZE/BANDAGES/DRESSINGS) ×1
DERMABOND ADVANCED .7 DNX12 (GAUZE/BANDAGES/DRESSINGS) ×2 IMPLANT
DRAPE LAPAROSCOPIC ABDOMINAL (DRAPES) ×3 IMPLANT
DRAPE UTILITY XL STRL (DRAPES) ×3 IMPLANT
ELECT COATED BLADE 2.86 ST (ELECTRODE) ×3 IMPLANT
ELECT REM PT RETURN 9FT ADLT (ELECTROSURGICAL) ×3
ELECTRODE REM PT RTRN 9FT ADLT (ELECTROSURGICAL) ×2 IMPLANT
GLOVE BIO SURGEON STRL SZ7.5 (GLOVE) ×6 IMPLANT
GOWN STRL REUS W/ TWL LRG LVL3 (GOWN DISPOSABLE) ×4 IMPLANT
GOWN STRL REUS W/TWL LRG LVL3 (GOWN DISPOSABLE) ×6
ILLUMINATOR WAVEGUIDE N/F (MISCELLANEOUS) IMPLANT
KIT MARKER MARGIN INK (KITS) ×3 IMPLANT
LIGHT WAVEGUIDE WIDE FLAT (MISCELLANEOUS) IMPLANT
NDL HYPO 25X1 1.5 SAFETY (NEEDLE) IMPLANT
NEEDLE HYPO 25X1 1.5 SAFETY (NEEDLE) IMPLANT
NS IRRIG 1000ML POUR BTL (IV SOLUTION) IMPLANT
PACK BASIN DAY SURGERY FS (CUSTOM PROCEDURE TRAY) ×3 IMPLANT
PENCIL SMOKE EVACUATOR (MISCELLANEOUS) ×3 IMPLANT
SLEEVE SCD COMPRESS KNEE MED (MISCELLANEOUS) ×3 IMPLANT
SPONGE LAP 18X18 RF (DISPOSABLE) ×3 IMPLANT
SUT MON AB 4-0 PC3 18 (SUTURE) ×3 IMPLANT
SUT SILK 2 0 SH (SUTURE) IMPLANT
SUT VICRYL 3-0 CR8 SH (SUTURE) ×3 IMPLANT
SYR CONTROL 10ML LL (SYRINGE) IMPLANT
TOWEL GREEN STERILE FF (TOWEL DISPOSABLE) ×3 IMPLANT
TRAY FAXITRON CT DISP (TRAY / TRAY PROCEDURE) ×3 IMPLANT
TUBE CONNECTING 20X1/4 (TUBING) ×3 IMPLANT
YANKAUER SUCT BULB TIP NO VENT (SUCTIONS) IMPLANT

## 2020-04-16 NOTE — Anesthesia Preprocedure Evaluation (Signed)
Anesthesia Evaluation  Patient identified by MRN, date of birth, ID band Patient awake    Reviewed: Allergy & Precautions, NPO status , Patient's Chart, lab work & pertinent test results  History of Anesthesia Complications (+) PONV and history of anesthetic complications  Airway Mallampati: II  TM Distance: >3 FB Neck ROM: Full    Dental  (+) Teeth Intact, Dental Advisory Given   Pulmonary neg shortness of breath, neg sleep apnea, neg COPD, neg recent URI, former smoker,    Pulmonary exam normal breath sounds clear to auscultation       Cardiovascular hypertension, Pt. on medications (-) angina(-) Past MI and (-) Cardiac Stents + dysrhythmias (PVCs)  Rhythm:Regular Rate:Normal  TTE 02/11/2016: Study Conclusions  - Left ventricle: The cavity size was normal. Wall thickness was   normal. Systolic function was normal. The estimated ejection   fraction was in the range of 55% to 60%. Wall motion was normal;   there were no regional wall motion abnormalities. Left   ventricular diastolic function parameters were normal for the   patient&'s age.   Neuro/Psych neg Seizures PSYCHIATRIC DISORDERS Anxiety negative neurological ROS     GI/Hepatic negative GI ROS, Neg liver ROS,   Endo/Other  negative endocrine ROS  Renal/GU negative Renal ROS  Female GU complaint (overactive bladder)     Musculoskeletal Thoracic back pain   Abdominal   Peds  Hematology negative hematology ROS (+)   Anesthesia Other Findings   Reproductive/Obstetrics                             Anesthesia Physical  Anesthesia Plan  ASA: III  Anesthesia Plan: General   Post-op Pain Management:  Regional for Post-op pain   Induction: Intravenous  PONV Risk Score and Plan: 4 or greater and Ondansetron, Dexamethasone, Midazolam, Droperidol and Treatment may vary due to age or medical condition  Airway Management Planned:  LMA  Additional Equipment:   Intra-op Plan:   Post-operative Plan: Extubation in OR  Informed Consent: I have reviewed the patients History and Physical, chart, labs and discussed the procedure including the risks, benefits and alternatives for the proposed anesthesia with the patient or authorized representative who has indicated his/her understanding and acceptance.     Dental advisory given  Plan Discussed with: CRNA  Anesthesia Plan Comments: (Risks of general anesthesia discussed including, but not limited to, sore throat, hoarse voice, chipped/damaged teeth, injury to vocal cords, nausea and vomiting, allergic reactions, lung infection, heart attack, stroke, and death. All questions answered. )        Anesthesia Quick Evaluation

## 2020-04-16 NOTE — Interval H&P Note (Signed)
History and Physical Interval Note:  04/16/2020 8:32 AM  [name]  has presented today for surgery, with the diagnosis of right breast cancer.  The various methods of treatment have been discussed with the patient and family. After consideration of risks, benefits and other options for treatment, the patient has consented to  Procedure(s): RIGHT BREAST LUMPECTOMY WITH RADIOACTIVE SEED LOCALIZATION AND SENTINEL NODE BIOPSY (N/A) as a surgical intervention.  The patient's history has been reviewed, patient examined, no change in status, stable for surgery.  I have reviewed the patient's chart and labs.  Questions were answered to the patient's satisfaction.     Autumn Messing III

## 2020-04-16 NOTE — H&P (Signed)
Kelsey Simon  Location: [facility] Patient #: [id number] DOB: 1955-12-25 Married / Language: English / Race: White Female   History of Present Illness  The patient is a 64 year old female who presents for a follow-up for Breast cancer. We are asked to see the patient in consultation by Dr. Denice Paradise to evaluate her for a new right breast cancer. The patient is a 64 year old white female who recently felt a mass in the upper inner quadrant of the right breast. She was evaluated with mammogram and ultrasound and found to have a 6 mm mass in this area. The lymph nodes looked normal. 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 She denies any breast pain or discharge from the nipple. She did have a complex sclerosing lesion removed from the right breast in 2017. She does not smoke.   Past Surgical History  Breast Biopsy  Right. Colon Polyp Removal - Colonoscopy  Oral Surgery   Diagnostic Studies History Colonoscopy  1-5 years ago 5-10 years ago Mammogram  1-3 years ago within last year  Medication History  Dicyclomine HCl (20MG Tablet, Oral) Active. Triamterene-HCTZ (75-50MG Tablet, Oral) Active. ClonazePAM (0.5MG Tablet, Oral) Active. Vitamin C (100MG Tablet, Oral) Active. Magnesium (100MG Capsule, Oral) Active. Medications Reconciled  Social History Alcohol use  Moderate alcohol use. Caffeine use  Coffee, Tea. No drug use  Tobacco use  Former smoker.  Family History Arthritis  Mother. Cancer  Father. Hypertension  Father. Prostate Cancer  Father. Thyroid problems  Father.  Pregnancy / Birth History  Age at menarche  60 years. Age of menopause  90-55 Gravida  1 Length (months) of breastfeeding  3-6 Maternal age  78-25 Para  1  Other Problems  Breast Cancer  High blood pressure     Review of Systems General Not  Present- Appetite Loss, Chills, Fatigue, Fever, Night Sweats, Weight Gain and Weight Loss. Skin Not Present- Change in Wart/Mole, Dryness, Hives, Jaundice, New Lesions, Non-Healing Wounds, Rash and Ulcer. HEENT Present- Wears glasses/contact lenses. Not Present- Earache, Hearing Loss, Hoarseness, Nose Bleed, Oral Ulcers, Ringing in the Ears, Seasonal Allergies, Sinus Pain, Sore Throat, Visual Disturbances and Yellow Eyes. Respiratory Not Present- Bloody sputum, Chronic Cough, Difficulty Breathing, Snoring and Wheezing. Breast Present- Breast Mass. Not Present- Breast Pain, Nipple Discharge and Skin Changes. Cardiovascular Not Present- Chest Pain, Difficulty Breathing Lying Down, Leg Cramps, Palpitations, Rapid Heart Rate, Shortness of Breath and Swelling of Extremities. Gastrointestinal Not Present- Abdominal Pain, Bloating, Bloody Stool, Change in Bowel Habits, Chronic diarrhea, Constipation, Difficulty Swallowing, Excessive gas, Gets full quickly at meals, Hemorrhoids, Indigestion, Nausea, Rectal Pain and Vomiting. Female Genitourinary Not Present- Frequency, Nocturia, Painful Urination, Pelvic Pain and Urgency. Musculoskeletal Not Present- Back Pain, Joint Pain, Joint Stiffness, Muscle Pain, Muscle Weakness and Swelling of Extremities. Neurological Not Present- Decreased Memory, Fainting, Headaches, Numbness, Seizures, Tingling, Tremor, Trouble walking and Weakness. Psychiatric Not Present- Anxiety, Bipolar, Change in Sleep Pattern, Depression, Fearful and Frequent crying. Endocrine Not Present- Cold Intolerance, Excessive Hunger, Hair Changes, Heat Intolerance, Hot flashes and New Diabetes. Hematology Not Present- Blood Thinners, Easy Bruising, Excessive bleeding, Gland problems, HIV and Persistent Infections.  Vitals  Weight: 135.5 lb Height: 67.5in Body Surface Area: 1.72 m Body Mass Index: 20.91 kg/m  Temp.: 49F  Pulse: 89 (Regular)  P.OX: 97% (Room air) BP: 150/98(Sitting,  Left Arm, Standard)  Physical Exam  General Mental Status-Alert. General Appearance-Consistent with stated age. Hydration-Well hydrated. Voice-Normal.  Head and Neck Head-normocephalic, atraumatic with no lesions or palpable masses. Trachea-midline. Thyroid Gland Characteristics - normal size and consistency.  Eye Eyeball - Bilateral-Extraocular movements intact. Sclera/Conjunctiva - Bilateral-No scleral icterus.  Chest and Lung Exam Chest and lung exam reveals -quiet, even and easy respiratory effort with no use of accessory muscles and on auscultation, normal breath sounds, no adventitious sounds and normal vocal resonance. Inspection Chest Wall - Normal. Back - normal.  Breast Note: The right breast inner periareolar incision has healed nicely. There is a small palpable mass in the upper inner quadrant of the right breast but it is difficult to tell if this is the actual biopsy site or part of the biopsy tract. Other than this there is no other palpable mass in either breast. There is no palpable axillary, supraclavicular, or cervical lymphadenopathy.   Cardiovascular Cardiovascular examination reveals -normal heart sounds, regular rate and rhythm with no murmurs and normal pedal pulses bilaterally.  Abdomen Inspection Inspection of the abdomen reveals - No Hernias. Skin - Scar - no surgical scars. Palpation/Percussion Palpation and Percussion of the abdomen reveal - Soft, Non Tender, No Rebound tenderness, No Rigidity (guarding) and No hepatosplenomegaly. Auscultation Auscultation of the abdomen reveals - Bowel sounds normal.  Neurologic Neurologic evaluation reveals -alert and oriented x 3 with no impairment of recent or remote memory. Mental Status-Normal.  Musculoskeletal Normal Exam - Left-Upper Extremity Strength Normal and Lower Extremity Strength Normal. Normal Exam - Right-Upper Extremity Strength Normal and Lower  Extremity Strength Normal.  Lymphatic Head & Neck  General Head & Neck Lymphatics: Bilateral - Description - Normal. Axillary  General Axillary Region: Bilateral - Description - Normal. Tenderness - Non Tender. Femoral & Inguinal  Generalized Femoral & Inguinal Lymphatics: Bilateral - Description - Normal. Tenderness - Non Tender.    Assessment & Plan  MALIGNANT NEOPLASM OF UPPER-INNER QUADRANT OF RIGHT BREAST IN FEMALE, ESTROGEN RECEPTOR POSITIVE (C50.211) Impression: The patient appears to have a small stage I cancer in the upper inner quadrant of the right breast. I have discussed with her in detail the options for management. At this point she is trying to decide between breast conservation and mastectomy. I will go ahead and refer her to medical and radiation oncology to discuss adjuvant treatments. I will refer her to plastic surgery to talk about reconstructive options. I will also refer her to physical therapy for preoperative lymphedema testing. She will let me know once she has made a decision. I have discussed with her in detail the risks and benefits of the different operations as well as some of the technical aspects and she understands. This patient encounter took 60 minutes today to perform the following: take history, perform exam, review outside records, interpret imaging, counsel the patient on their diagnosis and document encounter, findings & plan in the EHR Current Plans Referred to Physical Therapy, for evaluation and follow up (Physical Therapy). Routine. Referred to Oncology, for evaluation and follow up (Oncology). Routine. Referred to Surgery - Plastic, for evaluation and follow up (Plastic Surgery). Routine.

## 2020-04-16 NOTE — Progress Notes (Signed)
Nuc med staff performed by nuc med staff. Pt tol well with no additional sedation required. VSS. Emotional support provided.

## 2020-04-16 NOTE — Progress Notes (Signed)
Assisted Dr. Miller with right, ultrasound guided, pectoralis block. Side rails up, monitors on throughout procedure. See vital signs in flow sheet. Tolerated Procedure well. 

## 2020-04-16 NOTE — Op Note (Signed)
04/16/2020  9:39 AM  PATIENT:  Kelsey Simon  64 y.o. female  PRE-OPERATIVE DIAGNOSIS:  right breast cancer  POST-OPERATIVE DIAGNOSIS:  right breast cancer  PROCEDURE:  Procedure(s): BREAST LUMPECTOMY WITH RADIOACTIVE SEED LOCALIZATION AND DEEP RIGHT AXILLARY SENTINEL LYMPH NODE BIOPSY (Right)  SURGEON:  Surgeon(s) and Role:    * Jovita Kussmaul, MD - Primary  PHYSICIAN ASSISTANT:   ASSISTANTS: none   ANESTHESIA:   local and general  EBL:  minimal   BLOOD ADMINISTERED:none  DRAINS: none   LOCAL MEDICATIONS USED:  MARCAINE     SPECIMEN:  Source of Specimen:  right breast tissue and sentinel node  DISPOSITION OF SPECIMEN:  PATHOLOGY  COUNTS:  YES  TOURNIQUET:  * No tourniquets in log *  DICTATION: .Dragon Dictation   After informed consent was obtained the patient was brought to the operating room and placed in the supine position on the operating table. After adequate induction of general anesthesia the patient's right chest, breast, and axillary area were prepped with ChloraPrep, allowed to dry, and draped in usual sterile manner. An appropriate timeout was performed. Previously an I-125 seed was placed in the upper inner quadrant of the right breast to mark an area of invasive breast cancer. Also earlier in the day the patient underwent injection of 1 mCi of technetium sulfur colloid in the subareolar position on the right. The neoprobe was set to technetium and an area of radioactivity was readily identified in the right axilla. The area around this was infiltrated with quarter percent Marcaine. A small transversely oriented incision was made with a 15 blade knife overlying the area of radioactivity. The incision was carried through the skin and subcutaneous tissue sharply with the electrocautery until the deep right axillary space was entered. Blunt hemostat dissection was directed by the neoprobe. I was able to identify one hot lymph node. This was excised sharply with  the electrocautery and the surrounding small vessels and lymphatics were controlled with clips. Ex vivo counts on this node were approximately 100. No other hot or palpable nodes were identified in the right axilla. Hemostasis was achieved using the Bovie electrocautery. The deep layer of the incision was closed with interrupted 3-0 Vicryl stitches. The skin was closed with a running 4-0 Monocryl subcuticular stitch. Attention was then turned to the right breast. The neoprobe was set to I-125 in the area of radioactivity was readily identified. The area was very superficial to the skin. Because of this I elected to make an elliptical incision in the skin overlying the area of radioactivity with a 15 blade knife. The incision was carried through the skin and subcutaneous tissue sharply with the electrocautery. Dissection was then carried widely around the radioactive seed while checking the area of radioactivity frequently and all the way to the chest wall. Once the specimen was removed it was oriented with the appropriate paint colors. A specimen radiograph was obtained that showed the clip and seed to be near the center of the specimen. The specimen was then sent to pathology for further evaluation. Hemostasis was achieved using the Bovie electrocautery. The wound was irrigated with saline and infiltrated with more quarter percent Marcaine. The deep layer of the wound was then closed with interrupted 3-0 Vicryl stitches. The skin was then closed with a running 4-0 Monocryl subcuticular stitch. Dermabond dressings were applied. The patient tolerated the procedure well. At the end of the case all needle sponge and instrument counts were correct. The patient was then  awakened and taken to recovery in stable condition.  PLAN OF CARE: Discharge to home after PACU  PATIENT DISPOSITION:  PACU - hemodynamically stable.   Delay start of Pharmacological VTE agent (>24hrs) due to surgical blood loss or risk of bleeding:  not applicable

## 2020-04-16 NOTE — Anesthesia Postprocedure Evaluation (Signed)
Anesthesia Post Note  Patient: CIELA MAHAJAN  Procedure(s) Performed: BREAST LUMPECTOMY WITH RADIOACTIVE SEED AND SENTINEL LYMPH NODE BIOPSY (Right Breast)     Patient location during evaluation: PACU Anesthesia Type: General Level of consciousness: awake and alert Pain management: pain level controlled Vital Signs Assessment: post-procedure vital signs reviewed and stable Respiratory status: spontaneous breathing, nonlabored ventilation and respiratory function stable Cardiovascular status: blood pressure returned to baseline and stable Postop Assessment: no apparent nausea or vomiting Anesthetic complications: no   No complications documented.  Last Vitals:  Vitals:   04/16/20 1002 04/16/20 1025  BP: 123/71 131/77  Pulse: 82 69  Resp: 13 16  Temp:  37.1 C  SpO2: 98% 100%    Last Pain:  Vitals:   04/16/20 1025  TempSrc: Oral  PainSc: 0-No pain                 Lynda Rainwater

## 2020-04-16 NOTE — Discharge Instructions (Signed)
  Post Anesthesia Home Care Instructions  Activity: Get plenty of rest for the remainder of the day. A responsible individual must stay with you for 24 hours following the procedure.  For the next 24 hours, DO NOT: -Drive a car -Paediatric nurse -Drink alcoholic beverages -Take any medication unless instructed by your physician -Make any legal decisions or sign important papers.  Meals: Start with liquid foods such as gelatin or soup. Progress to regular foods as tolerated. Avoid greasy, spicy, heavy foods. If nausea and/or vomiting occur, drink only clear liquids until the nausea and/or vomiting subsides. Call your physician if vomiting continues.  Special Instructions/Symptoms: Your throat may feel dry or sore from the anesthesia or the breathing tube placed in your throat during surgery. If this causes discomfort, gargle with warm salt water. The discomfort should disappear within 24 hours.  If you had a scopolamine patch placed behind your ear for the management of post- operative nausea and/or vomiting:  1. The medication in the patch is effective for 72 hours, after which it should be removed.  Wrap patch in a tissue and discard in the trash. Wash hands thoroughly with soap and water. 2. You may remove the patch earlier than 72 hours if you experience unpleasant side effects which may include dry mouth, dizziness or visual disturbances. 3. Avoid touching the patch. Wash your hands with soap and water after contact with the patch.    May take Tylenol and Ibuprofen after 1pm, if needed.

## 2020-04-16 NOTE — Transfer of Care (Signed)
Immediate Anesthesia Transfer of Care Note  Patient: Kelsey Simon  Procedure(s) Performed: BREAST LUMPECTOMY WITH RADIOACTIVE SEED AND SENTINEL LYMPH NODE BIOPSY (Right Breast)  Patient Location: PACU  Anesthesia Type:GA combined with regional for post-op pain  Level of Consciousness: drowsy and patient cooperative  Airway & Oxygen Therapy: Patient Spontanous Breathing and Patient connected to face mask oxygen  Post-op Assessment: Report given to RN and Post -op Vital signs reviewed and stable  Post vital signs: Reviewed and stable  Last Vitals:  Vitals Value Taken Time  BP 120/70 04/16/20 0945  Temp    Pulse 45 04/16/20 0945  Resp 13 04/16/20 0945  SpO2 92 % 04/16/20 0945  Vitals shown include unvalidated device data.  Last Pain:  Vitals:   04/16/20 0700  TempSrc: Oral  PainSc: 0-No pain      Patients Stated Pain Goal: 8 (27/03/50 0938)  Complications: No complications documented.

## 2020-04-16 NOTE — Anesthesia Procedure Notes (Signed)
Procedure Name: LMA Insertion Date/Time: 04/16/2020 8:45 AM Performed by: Signe Colt, CRNA Pre-anesthesia Checklist: Patient identified, Emergency Drugs available, Suction available and Patient being monitored Patient Re-evaluated:Patient Re-evaluated prior to induction Oxygen Delivery Method: Circle System Utilized Preoxygenation: Pre-oxygenation with 100% oxygen Induction Type: IV induction Ventilation: Mask ventilation without difficulty LMA: LMA inserted LMA Size: 4.0 Number of attempts: 1 Airway Equipment and Method: bite block Placement Confirmation: positive ETCO2 Tube secured with: Tape Dental Injury: Teeth and Oropharynx as per pre-operative assessment

## 2020-04-16 NOTE — Anesthesia Procedure Notes (Signed)
Anesthesia Regional Block: Pectoralis block   Pre-Anesthetic Checklist: ,, timeout performed, Correct Patient, Correct Site, Correct Laterality, Correct Procedure, Correct Position, site marked, Risks and benefits discussed,  Surgical consent,  Pre-op evaluation,  At surgeon's request and post-op pain management  Laterality: Right  Prep: chloraprep       Needles:  Injection technique: Single-shot  Needle Type: Stimiplex     Needle Length: 9cm  Needle Gauge: 21     Additional Needles:   Procedures:,,,, ultrasound used (permanent image in chart),,,,  Narrative:  Start time: 04/16/2020 7:55 AM End time: 04/16/2020 8:00 AM Injection made incrementally with aspirations every 5 mL.  Performed by: Personally  Anesthesiologist: Lynda Rainwater, MD

## 2020-04-17 ENCOUNTER — Encounter (HOSPITAL_BASED_OUTPATIENT_CLINIC_OR_DEPARTMENT_OTHER): Payer: Self-pay | Admitting: General Surgery

## 2020-04-17 LAB — SURGICAL PATHOLOGY

## 2020-04-21 ENCOUNTER — Encounter: Payer: Self-pay | Admitting: *Deleted

## 2020-04-28 ENCOUNTER — Ambulatory Visit
Admission: RE | Admit: 2020-04-28 | Discharge: 2020-04-28 | Disposition: A | Discharge status: Home / Self Care | Payer: BC Managed Care – PPO | Source: Ambulatory Visit | Attending: Radiation Oncology | Admitting: Radiation Oncology

## 2020-04-28 VITALS — BP 150/85 | HR 81 | Temp 96.8°F | Resp 16 | Wt 137.4 lb

## 2020-04-28 DIAGNOSIS — C50211 Malignant neoplasm of upper-inner quadrant of right female breast: Secondary | ICD-10-CM | POA: Insufficient documentation

## 2020-04-28 DIAGNOSIS — Z170 Estrogen receptor positive status [ER+]: Secondary | ICD-10-CM | POA: Diagnosis not present

## 2020-04-28 NOTE — Progress Notes (Signed)
Radiation Oncology Follow up Note  Name: Kelsey Simon   Date:   04/28/2020 MRN:  [id number] DOB: 31-May-1956    This 64 y.o. female presents to the clinic today for stage I grade 1 well-differentiated invasive mammary carcinoma the right breast status post wide local excision and sentinel node biopsy.Marland Kitchen  REFERRING PROVIDER: Marval Regal, NP  HPI: Patient is a 64 year old female initially consulted prior to her wide local excision for stage Ia (1 T1b N0 M0) ER/PR positive HER-2 negative invasive mammary carcinoma.  She went on to have a wide local excision showing.  8.6 cm grade 1 invasive mammary carcinoma with margins clear at 1.5 mm and superior margin at 4 mm.  No lymph vascular invasion or perineural invasion was seen.  1 sentinel lymph node was negative.  She did well postoperatively has had very few side effects or complaints.  She is seen today for evaluation for whole breast radiation  COMPLICATIONS OF TREATMENT: none  FOLLOW UP COMPLIANCE: keeps appointments   PHYSICAL EXAM:  BP (!) 150/85 (BP Location: Left Arm, Patient Position: Sitting)   Pulse 81   Temp (!) 96.8 F (36 C) (Tympanic)   Resp 16   Wt 137 lb 6.4 oz (62.3 kg)   BMI 21.52 kg/m  Right breast is wide local excision which is healing well.  No dominant mass or nodularity is noted in either breast in 2 positions examined.  No axillary or supraclavicular adenopathy is noted.  Well-developed well-nourished patient in NAD. HEENT reveals PERLA, EOMI, discs not visualized.  Oral cavity is clear. No oral mucosal lesions are identified. Neck is clear without evidence of cervical or supraclavicular adenopathy. Lungs are clear to A&P. Cardiac examination is essentially unremarkable with regular rate and rhythm without murmur rub or thrill. Abdomen is benign with no organomegaly or masses noted. Motor sensory and DTR levels are equal and symmetric in the upper and lower extremities. Cranial nerves II through XII are grossly  intact. Proprioception is intact. No peripheral adenopathy or edema is identified. No motor or sensory levels are noted. Crude visual fields are within normal range.  RADIOLOGY RESULTS: No current films for review  PLAN: At this time I like to go ahead with hypofractionated course of whole breast radiation.  Would also boost her scar another 1000 cGy using electron beam.  Risks and benefits of treatment including skin reaction fatigue alteration of blood counts possible inclusion of superficial lung all were described in detail to the patient.  Have also reached out to Dr. Burr Medico at [facility] who his original medical oncologist to inquire whether she would need Oncotype DX.  I doubt based on the size grade of tumor and margin status she will need chemotherapy.  We will wait another week to start treatment planning and will await her response from medical oncology.  Patient comprehends my recommendations well.  I would like to take this opportunity to thank you for allowing me to participate in the care of your patient.Noreene Filbert, MD

## 2020-05-01 ENCOUNTER — Other Ambulatory Visit: Payer: Self-pay

## 2020-05-01 ENCOUNTER — Ambulatory Visit
Admission: RE | Admit: 2020-05-01 | Discharge: 2020-05-01 | Disposition: A | Discharge status: Home / Self Care | Payer: BC Managed Care – PPO | Source: Ambulatory Visit | Attending: Nurse Practitioner | Admitting: Nurse Practitioner

## 2020-05-01 DIAGNOSIS — Z780 Asymptomatic menopausal state: Secondary | ICD-10-CM | POA: Diagnosis not present

## 2020-05-01 DIAGNOSIS — Z13820 Encounter for screening for osteoporosis: Secondary | ICD-10-CM

## 2020-05-05 ENCOUNTER — Encounter: Payer: Self-pay | Admitting: *Deleted

## 2020-05-06 ENCOUNTER — Ambulatory Visit
Admission: RE | Admit: 2020-05-06 | Discharge: 2020-05-06 | Disposition: A | Discharge status: Home / Self Care | Payer: BC Managed Care – PPO | Source: Ambulatory Visit | Attending: Radiation Oncology | Admitting: Radiation Oncology

## 2020-05-06 DIAGNOSIS — C50211 Malignant neoplasm of upper-inner quadrant of right female breast: Secondary | ICD-10-CM | POA: Diagnosis not present

## 2020-05-06 DIAGNOSIS — Z170 Estrogen receptor positive status [ER+]: Secondary | ICD-10-CM | POA: Diagnosis not present

## 2020-05-06 DIAGNOSIS — C510 Malignant neoplasm of labium majus: Secondary | ICD-10-CM | POA: Diagnosis not present

## 2020-05-06 DIAGNOSIS — Z510 Encounter for antineoplastic radiation therapy: Secondary | ICD-10-CM | POA: Diagnosis not present

## 2020-05-07 ENCOUNTER — Other Ambulatory Visit: Payer: Self-pay | Admitting: *Deleted

## 2020-05-07 DIAGNOSIS — C50211 Malignant neoplasm of upper-inner quadrant of right female breast: Secondary | ICD-10-CM | POA: Diagnosis not present

## 2020-05-07 DIAGNOSIS — Z510 Encounter for antineoplastic radiation therapy: Secondary | ICD-10-CM | POA: Diagnosis not present

## 2020-05-07 DIAGNOSIS — C510 Malignant neoplasm of labium majus: Secondary | ICD-10-CM | POA: Diagnosis not present

## 2020-05-07 DIAGNOSIS — Z170 Estrogen receptor positive status [ER+]: Secondary | ICD-10-CM | POA: Diagnosis not present

## 2020-05-09 ENCOUNTER — Telehealth: Payer: Self-pay | Admitting: Hematology

## 2020-05-09 NOTE — Telephone Encounter (Signed)
Scheduled per 12/6 sch msg. Called pt and left a msg

## 2020-05-13 ENCOUNTER — Ambulatory Visit: Admission: RE | Admit: 2020-05-13 | Payer: BC Managed Care – PPO | Source: Ambulatory Visit

## 2020-05-13 DIAGNOSIS — Z170 Estrogen receptor positive status [ER+]: Secondary | ICD-10-CM | POA: Diagnosis not present

## 2020-05-13 DIAGNOSIS — C50211 Malignant neoplasm of upper-inner quadrant of right female breast: Secondary | ICD-10-CM | POA: Diagnosis not present

## 2020-05-13 DIAGNOSIS — C510 Malignant neoplasm of labium majus: Secondary | ICD-10-CM | POA: Diagnosis not present

## 2020-05-13 DIAGNOSIS — Z510 Encounter for antineoplastic radiation therapy: Secondary | ICD-10-CM | POA: Diagnosis not present

## 2020-05-14 ENCOUNTER — Ambulatory Visit
Admission: RE | Admit: 2020-05-14 | Discharge: 2020-05-14 | Disposition: A | Discharge status: Home / Self Care | Payer: BC Managed Care – PPO | Source: Ambulatory Visit | Attending: Radiation Oncology | Admitting: Radiation Oncology

## 2020-05-14 DIAGNOSIS — Z170 Estrogen receptor positive status [ER+]: Secondary | ICD-10-CM | POA: Diagnosis not present

## 2020-05-14 DIAGNOSIS — Z510 Encounter for antineoplastic radiation therapy: Secondary | ICD-10-CM | POA: Diagnosis not present

## 2020-05-14 DIAGNOSIS — C510 Malignant neoplasm of labium majus: Secondary | ICD-10-CM | POA: Diagnosis not present

## 2020-05-14 DIAGNOSIS — C50211 Malignant neoplasm of upper-inner quadrant of right female breast: Secondary | ICD-10-CM | POA: Diagnosis not present

## 2020-05-15 ENCOUNTER — Ambulatory Visit
Admission: RE | Admit: 2020-05-15 | Discharge: 2020-05-15 | Disposition: A | Discharge status: Home / Self Care | Payer: BC Managed Care – PPO | Source: Ambulatory Visit | Attending: Radiation Oncology | Admitting: Radiation Oncology

## 2020-05-15 DIAGNOSIS — Z510 Encounter for antineoplastic radiation therapy: Secondary | ICD-10-CM | POA: Diagnosis not present

## 2020-05-15 DIAGNOSIS — Z170 Estrogen receptor positive status [ER+]: Secondary | ICD-10-CM | POA: Diagnosis not present

## 2020-05-15 DIAGNOSIS — C50211 Malignant neoplasm of upper-inner quadrant of right female breast: Secondary | ICD-10-CM | POA: Diagnosis not present

## 2020-05-15 DIAGNOSIS — C510 Malignant neoplasm of labium majus: Secondary | ICD-10-CM | POA: Diagnosis not present

## 2020-05-16 ENCOUNTER — Ambulatory Visit
Admission: RE | Admit: 2020-05-16 | Discharge: 2020-05-16 | Disposition: A | Discharge status: Home / Self Care | Payer: BC Managed Care – PPO | Source: Ambulatory Visit | Attending: Radiation Oncology | Admitting: Radiation Oncology

## 2020-05-16 DIAGNOSIS — Z170 Estrogen receptor positive status [ER+]: Secondary | ICD-10-CM | POA: Diagnosis not present

## 2020-05-16 DIAGNOSIS — C510 Malignant neoplasm of labium majus: Secondary | ICD-10-CM | POA: Diagnosis not present

## 2020-05-16 DIAGNOSIS — Z510 Encounter for antineoplastic radiation therapy: Secondary | ICD-10-CM | POA: Diagnosis not present

## 2020-05-16 DIAGNOSIS — C50211 Malignant neoplasm of upper-inner quadrant of right female breast: Secondary | ICD-10-CM | POA: Diagnosis not present

## 2020-05-19 ENCOUNTER — Ambulatory Visit
Admission: RE | Admit: 2020-05-19 | Discharge: 2020-05-19 | Disposition: A | Discharge status: Home / Self Care | Payer: BC Managed Care – PPO | Source: Ambulatory Visit | Attending: Radiation Oncology | Admitting: Radiation Oncology

## 2020-05-19 DIAGNOSIS — Z170 Estrogen receptor positive status [ER+]: Secondary | ICD-10-CM | POA: Diagnosis not present

## 2020-05-19 DIAGNOSIS — C510 Malignant neoplasm of labium majus: Secondary | ICD-10-CM | POA: Diagnosis not present

## 2020-05-19 DIAGNOSIS — Z510 Encounter for antineoplastic radiation therapy: Secondary | ICD-10-CM | POA: Diagnosis not present

## 2020-05-19 DIAGNOSIS — C50211 Malignant neoplasm of upper-inner quadrant of right female breast: Secondary | ICD-10-CM | POA: Diagnosis not present

## 2020-05-20 ENCOUNTER — Ambulatory Visit
Admission: RE | Admit: 2020-05-20 | Discharge: 2020-05-20 | Disposition: A | Discharge status: Home / Self Care | Payer: BC Managed Care – PPO | Source: Ambulatory Visit | Attending: Radiation Oncology | Admitting: Radiation Oncology

## 2020-05-20 DIAGNOSIS — C510 Malignant neoplasm of labium majus: Secondary | ICD-10-CM | POA: Diagnosis not present

## 2020-05-20 DIAGNOSIS — Z170 Estrogen receptor positive status [ER+]: Secondary | ICD-10-CM | POA: Diagnosis not present

## 2020-05-20 DIAGNOSIS — Z510 Encounter for antineoplastic radiation therapy: Secondary | ICD-10-CM | POA: Diagnosis not present

## 2020-05-20 DIAGNOSIS — C50211 Malignant neoplasm of upper-inner quadrant of right female breast: Secondary | ICD-10-CM | POA: Diagnosis not present

## 2020-05-21 ENCOUNTER — Ambulatory Visit
Admission: RE | Admit: 2020-05-21 | Discharge: 2020-05-21 | Disposition: A | Discharge status: Home / Self Care | Payer: BC Managed Care – PPO | Source: Ambulatory Visit | Attending: Radiation Oncology | Admitting: Radiation Oncology

## 2020-05-21 DIAGNOSIS — Z170 Estrogen receptor positive status [ER+]: Secondary | ICD-10-CM | POA: Diagnosis not present

## 2020-05-21 DIAGNOSIS — C50211 Malignant neoplasm of upper-inner quadrant of right female breast: Secondary | ICD-10-CM | POA: Diagnosis not present

## 2020-05-21 DIAGNOSIS — Z510 Encounter for antineoplastic radiation therapy: Secondary | ICD-10-CM | POA: Diagnosis not present

## 2020-05-21 DIAGNOSIS — C510 Malignant neoplasm of labium majus: Secondary | ICD-10-CM | POA: Diagnosis not present

## 2020-05-22 ENCOUNTER — Ambulatory Visit
Admission: RE | Admit: 2020-05-22 | Discharge: 2020-05-22 | Disposition: A | Discharge status: Home / Self Care | Payer: BC Managed Care – PPO | Source: Ambulatory Visit | Attending: Radiation Oncology | Admitting: Radiation Oncology

## 2020-05-22 DIAGNOSIS — C50211 Malignant neoplasm of upper-inner quadrant of right female breast: Secondary | ICD-10-CM | POA: Diagnosis not present

## 2020-05-22 DIAGNOSIS — Z170 Estrogen receptor positive status [ER+]: Secondary | ICD-10-CM | POA: Diagnosis not present

## 2020-05-22 DIAGNOSIS — C510 Malignant neoplasm of labium majus: Secondary | ICD-10-CM | POA: Diagnosis not present

## 2020-05-22 DIAGNOSIS — Z510 Encounter for antineoplastic radiation therapy: Secondary | ICD-10-CM | POA: Diagnosis not present

## 2020-05-26 ENCOUNTER — Ambulatory Visit
Admission: RE | Admit: 2020-05-26 | Discharge: 2020-05-26 | Disposition: A | Discharge status: Home / Self Care | Payer: BC Managed Care – PPO | Source: Ambulatory Visit | Attending: Radiation Oncology | Admitting: Radiation Oncology

## 2020-05-26 DIAGNOSIS — Z170 Estrogen receptor positive status [ER+]: Secondary | ICD-10-CM | POA: Diagnosis not present

## 2020-05-26 DIAGNOSIS — Z510 Encounter for antineoplastic radiation therapy: Secondary | ICD-10-CM | POA: Diagnosis not present

## 2020-05-26 DIAGNOSIS — C50211 Malignant neoplasm of upper-inner quadrant of right female breast: Secondary | ICD-10-CM | POA: Diagnosis not present

## 2020-05-26 DIAGNOSIS — C510 Malignant neoplasm of labium majus: Secondary | ICD-10-CM | POA: Diagnosis not present

## 2020-05-27 ENCOUNTER — Ambulatory Visit
Admission: RE | Admit: 2020-05-27 | Discharge: 2020-05-27 | Disposition: A | Discharge status: Home / Self Care | Payer: BC Managed Care – PPO | Source: Ambulatory Visit | Attending: Radiation Oncology | Admitting: Radiation Oncology

## 2020-05-27 DIAGNOSIS — C510 Malignant neoplasm of labium majus: Secondary | ICD-10-CM | POA: Diagnosis not present

## 2020-05-27 DIAGNOSIS — Z170 Estrogen receptor positive status [ER+]: Secondary | ICD-10-CM | POA: Diagnosis not present

## 2020-05-27 DIAGNOSIS — C50211 Malignant neoplasm of upper-inner quadrant of right female breast: Secondary | ICD-10-CM | POA: Diagnosis not present

## 2020-05-27 DIAGNOSIS — Z510 Encounter for antineoplastic radiation therapy: Secondary | ICD-10-CM | POA: Diagnosis not present

## 2020-05-28 ENCOUNTER — Ambulatory Visit
Admission: RE | Admit: 2020-05-28 | Discharge: 2020-05-28 | Disposition: A | Discharge status: Home / Self Care | Payer: BC Managed Care – PPO | Source: Ambulatory Visit | Attending: Radiation Oncology | Admitting: Radiation Oncology

## 2020-05-28 ENCOUNTER — Inpatient Hospital Stay: Payer: BC Managed Care – PPO | Attending: Radiation Oncology

## 2020-05-28 ENCOUNTER — Other Ambulatory Visit: Payer: Self-pay

## 2020-05-28 DIAGNOSIS — Z170 Estrogen receptor positive status [ER+]: Secondary | ICD-10-CM | POA: Diagnosis not present

## 2020-05-28 DIAGNOSIS — Z510 Encounter for antineoplastic radiation therapy: Secondary | ICD-10-CM | POA: Diagnosis not present

## 2020-05-28 DIAGNOSIS — C50211 Malignant neoplasm of upper-inner quadrant of right female breast: Secondary | ICD-10-CM | POA: Insufficient documentation

## 2020-05-28 DIAGNOSIS — C510 Malignant neoplasm of labium majus: Secondary | ICD-10-CM | POA: Diagnosis not present

## 2020-05-28 LAB — CBC
HCT: 40 % (ref 36.0–46.0)
Hemoglobin: 13.7 g/dL (ref 12.0–15.0)
MCH: 28.3 pg (ref 26.0–34.0)
MCHC: 34.3 g/dL (ref 30.0–36.0)
MCV: 82.6 fL (ref 80.0–100.0)
Platelets: 276 10*3/uL (ref 150–400)
RBC: 4.84 MIL/uL (ref 3.87–5.11)
RDW: 11.8 % (ref 11.5–15.5)
WBC: 6.1 10*3/uL (ref 4.0–10.5)
nRBC: 0 % (ref 0.0–0.2)

## 2020-05-29 ENCOUNTER — Ambulatory Visit: Payer: BC Managed Care – PPO

## 2020-05-29 ENCOUNTER — Ambulatory Visit
Admission: RE | Admit: 2020-05-29 | Discharge: 2020-05-29 | Disposition: A | Discharge status: Home / Self Care | Payer: BC Managed Care – PPO | Source: Ambulatory Visit | Attending: Radiation Oncology | Admitting: Radiation Oncology

## 2020-05-29 DIAGNOSIS — C50211 Malignant neoplasm of upper-inner quadrant of right female breast: Secondary | ICD-10-CM | POA: Diagnosis not present

## 2020-05-29 DIAGNOSIS — Z510 Encounter for antineoplastic radiation therapy: Secondary | ICD-10-CM | POA: Diagnosis not present

## 2020-05-29 DIAGNOSIS — Z170 Estrogen receptor positive status [ER+]: Secondary | ICD-10-CM | POA: Diagnosis not present

## 2020-05-29 DIAGNOSIS — C510 Malignant neoplasm of labium majus: Secondary | ICD-10-CM | POA: Diagnosis not present

## 2020-06-02 ENCOUNTER — Ambulatory Visit
Admission: RE | Admit: 2020-06-02 | Discharge: 2020-06-02 | Disposition: A | Discharge status: Home / Self Care | Payer: BC Managed Care – PPO | Source: Ambulatory Visit | Attending: Radiation Oncology | Admitting: Radiation Oncology

## 2020-06-02 DIAGNOSIS — C50211 Malignant neoplasm of upper-inner quadrant of right female breast: Secondary | ICD-10-CM | POA: Insufficient documentation

## 2020-06-02 DIAGNOSIS — Z170 Estrogen receptor positive status [ER+]: Secondary | ICD-10-CM | POA: Diagnosis not present

## 2020-06-02 DIAGNOSIS — Z510 Encounter for antineoplastic radiation therapy: Secondary | ICD-10-CM | POA: Insufficient documentation

## 2020-06-03 ENCOUNTER — Ambulatory Visit
Admission: RE | Admit: 2020-06-03 | Discharge: 2020-06-03 | Disposition: A | Discharge status: Home / Self Care | Payer: BC Managed Care – PPO | Source: Ambulatory Visit | Attending: Radiation Oncology | Admitting: Radiation Oncology

## 2020-06-03 DIAGNOSIS — Z170 Estrogen receptor positive status [ER+]: Secondary | ICD-10-CM | POA: Diagnosis not present

## 2020-06-03 DIAGNOSIS — C50211 Malignant neoplasm of upper-inner quadrant of right female breast: Secondary | ICD-10-CM | POA: Diagnosis not present

## 2020-06-03 DIAGNOSIS — Z510 Encounter for antineoplastic radiation therapy: Secondary | ICD-10-CM | POA: Diagnosis not present

## 2020-06-04 ENCOUNTER — Ambulatory Visit
Admission: RE | Admit: 2020-06-04 | Discharge: 2020-06-04 | Disposition: A | Discharge status: Home / Self Care | Payer: BC Managed Care – PPO | Source: Ambulatory Visit | Attending: Radiation Oncology | Admitting: Radiation Oncology

## 2020-06-04 DIAGNOSIS — Z170 Estrogen receptor positive status [ER+]: Secondary | ICD-10-CM | POA: Diagnosis not present

## 2020-06-04 DIAGNOSIS — C50211 Malignant neoplasm of upper-inner quadrant of right female breast: Secondary | ICD-10-CM | POA: Diagnosis not present

## 2020-06-04 DIAGNOSIS — Z510 Encounter for antineoplastic radiation therapy: Secondary | ICD-10-CM | POA: Diagnosis not present

## 2020-06-05 ENCOUNTER — Ambulatory Visit
Admission: RE | Admit: 2020-06-05 | Discharge: 2020-06-05 | Disposition: A | Discharge status: Home / Self Care | Payer: BC Managed Care – PPO | Source: Ambulatory Visit | Attending: Radiation Oncology | Admitting: Radiation Oncology

## 2020-06-05 DIAGNOSIS — Z170 Estrogen receptor positive status [ER+]: Secondary | ICD-10-CM | POA: Diagnosis not present

## 2020-06-05 DIAGNOSIS — Z510 Encounter for antineoplastic radiation therapy: Secondary | ICD-10-CM | POA: Diagnosis not present

## 2020-06-05 DIAGNOSIS — C50211 Malignant neoplasm of upper-inner quadrant of right female breast: Secondary | ICD-10-CM | POA: Diagnosis not present

## 2020-06-06 ENCOUNTER — Ambulatory Visit
Admission: RE | Admit: 2020-06-06 | Discharge: 2020-06-06 | Disposition: A | Discharge status: Home / Self Care | Payer: BC Managed Care – PPO | Source: Ambulatory Visit | Attending: Radiation Oncology | Admitting: Radiation Oncology

## 2020-06-06 DIAGNOSIS — C50211 Malignant neoplasm of upper-inner quadrant of right female breast: Secondary | ICD-10-CM | POA: Diagnosis not present

## 2020-06-06 DIAGNOSIS — Z170 Estrogen receptor positive status [ER+]: Secondary | ICD-10-CM | POA: Diagnosis not present

## 2020-06-06 DIAGNOSIS — Z510 Encounter for antineoplastic radiation therapy: Secondary | ICD-10-CM | POA: Diagnosis not present

## 2020-06-09 ENCOUNTER — Ambulatory Visit
Admission: RE | Admit: 2020-06-09 | Discharge: 2020-06-09 | Disposition: A | Discharge status: Home / Self Care | Payer: BC Managed Care – PPO | Source: Ambulatory Visit | Attending: Radiation Oncology | Admitting: Radiation Oncology

## 2020-06-09 ENCOUNTER — Other Ambulatory Visit: Payer: Self-pay

## 2020-06-09 ENCOUNTER — Ambulatory Visit: Payer: BC Managed Care – PPO | Attending: General Surgery

## 2020-06-09 DIAGNOSIS — C50211 Malignant neoplasm of upper-inner quadrant of right female breast: Secondary | ICD-10-CM | POA: Insufficient documentation

## 2020-06-09 DIAGNOSIS — Z170 Estrogen receptor positive status [ER+]: Secondary | ICD-10-CM | POA: Diagnosis not present

## 2020-06-09 DIAGNOSIS — Z510 Encounter for antineoplastic radiation therapy: Secondary | ICD-10-CM | POA: Diagnosis not present

## 2020-06-09 NOTE — Therapy (Signed)
[place], [place], [address] Phone: [phone]   Fax:  [phone]  Physical Therapy Treatment  Patient Details  Name: Kelsey Simon MRN: [id number] Date of Birth: 03/05/1956 Referring Provider (PT): Dr. Autumn Messing   Encounter Date: 06/09/2020   PT End of Session - 06/09/20 0953    Visit Number 1   # unchanged due to screen only   Number of Visits 2    Date for PT Re-Evaluation 06/30/20    PT Start Time 0943    PT Stop Time 0949    PT Time Calculation (min) 6 min    Activity Tolerance Patient tolerated treatment well    Behavior During Therapy [facility] for tasks assessed/performed           Past Medical History:  Diagnosis Date  . Hx of adenomatous colonic polyps 05/18/2018  . Hypertension   . IBS (irritable bowel syndrome)   . PONV (postoperative nausea and vomiting)   . Varicose veins     Past Surgical History:  Procedure Laterality Date  . BREAST BIOPSY Right 2017 and 2006   x 2  . BREAST EXCISIONAL BIOPSY    . BREAST LUMPECTOMY WITH RADIOACTIVE SEED AND SENTINEL LYMPH NODE BIOPSY Right 04/16/2020   Procedure: BREAST LUMPECTOMY WITH RADIOACTIVE SEED AND SENTINEL LYMPH NODE BIOPSY;  Surgeon: Jovita Kussmaul, MD;  Location: [place];  Service: General;  Laterality: Right;  . BREAST LUMPECTOMY WITH RADIOACTIVE SEED LOCALIZATION Right 02/13/2016   Procedure: RIGHT BREAST LUMPECTOMY WITH RADIOACTIVE SEED LOCALIZATION;  Surgeon: Autumn Messing III, MD;  Location: [place];  Service: General;  Laterality: Right;  . COLONOSCOPY    . NASAL SEPTUM SURGERY  1990  . UPPER GASTROINTESTINAL ENDOSCOPY    . WISDOM TOOTH EXTRACTION      There were no vitals filed for this visit.   Subjective Assessment - 06/09/20 0944    Subjective Pt returns for 3 month L-dex screen. "I'm doing great and don't have any limitations since surgery."    Pertinent History Rt lumpectomy with  SLNB on 04/16/20, currently undergoing radiation                  L-DEX FLOWSHEETS - 06/09/20 0900      L-DEX LYMPHEDEMA SCREENING   Measurement Type Unilateral    L-DEX MEASUREMENT EXTREMITY Upper Extremity    POSITION  Standing    DOMINANT SIDE Right    At Risk Side Right    BASELINE SCORE (UNILATERAL) 0.8    L-DEX SCORE (UNILATERAL) -0.9    VALUE CHANGE (UNILAT) -1.7                                  PT Long Term Goals - 03/04/20 1418      PT LONG TERM GOAL #1   Title Patient will demonstrate she has regained full shoulder ROM and function postoperatively compared to baseline    Time 8    Period Weeks    Status New    Target Date 04/29/20                 Plan - 06/09/20 0954    Clinical Impression Statement Pt returns for first 3 month L-Dex screen since surgery. Her change from baseline of -1.7 is WNLs so no further treatment is required at this time except to cont every 3 month L-Dex  screens for up to 2 years from SLNB. Pt agreeable to this.    PT Next Visit Plan Cont every 3 month L-Dex screens for up to 2 years from SLNB.    Consulted and Agree with Plan of Care Patient           Patient will benefit from skilled therapeutic intervention in order to improve the following deficits and impairments:     Visit Diagnosis: Malignant neoplasm of upper-inner quadrant of right breast in female, estrogen receptor positive ([place])     Problem List Patient Active Problem List   Diagnosis Date Noted  . Malignant neoplasm of upper-inner quadrant of right breast in female, estrogen receptor positive ([place]) 03/02/2020  . Encounter for screening mammogram for malignant neoplasm of breast 01/20/2020  . Screening for osteoporosis 01/20/2020  . Hx of adenomatous colonic polyps 05/18/2018  . 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 Anxiety 01/04/2012  . Essential hypertension     Otelia Limes, PTA 06/09/2020, 9:56 AM  [place], [place], [address] Phone: [phone]   Fax:  [phone]  Name: MICAYLAH BERTUCCI MRN: [id number] Date of Birth: Mar 05, 1956

## 2020-06-10 ENCOUNTER — Ambulatory Visit
Admission: RE | Admit: 2020-06-10 | Discharge: 2020-06-10 | Disposition: A | Discharge status: Home / Self Care | Payer: BC Managed Care – PPO | Source: Ambulatory Visit | Attending: Radiation Oncology | Admitting: Radiation Oncology

## 2020-06-10 DIAGNOSIS — C50211 Malignant neoplasm of upper-inner quadrant of right female breast: Secondary | ICD-10-CM | POA: Diagnosis not present

## 2020-06-10 DIAGNOSIS — Z170 Estrogen receptor positive status [ER+]: Secondary | ICD-10-CM | POA: Diagnosis not present

## 2020-06-10 DIAGNOSIS — Z510 Encounter for antineoplastic radiation therapy: Secondary | ICD-10-CM | POA: Diagnosis not present

## 2020-06-11 ENCOUNTER — Ambulatory Visit
Admission: RE | Admit: 2020-06-11 | Discharge: 2020-06-11 | Disposition: A | Discharge status: Home / Self Care | Payer: BC Managed Care – PPO | Source: Ambulatory Visit | Attending: Radiation Oncology | Admitting: Radiation Oncology

## 2020-06-11 DIAGNOSIS — C50211 Malignant neoplasm of upper-inner quadrant of right female breast: Secondary | ICD-10-CM | POA: Diagnosis not present

## 2020-06-11 DIAGNOSIS — Z510 Encounter for antineoplastic radiation therapy: Secondary | ICD-10-CM | POA: Diagnosis not present

## 2020-06-11 DIAGNOSIS — Z170 Estrogen receptor positive status [ER+]: Secondary | ICD-10-CM | POA: Diagnosis not present

## 2020-06-12 ENCOUNTER — Ambulatory Visit
Admission: RE | Admit: 2020-06-12 | Discharge: 2020-06-12 | Disposition: A | Discharge status: Home / Self Care | Payer: BC Managed Care – PPO | Source: Ambulatory Visit | Attending: Radiation Oncology | Admitting: Radiation Oncology

## 2020-06-12 ENCOUNTER — Encounter: Payer: Self-pay | Admitting: *Deleted

## 2020-06-12 DIAGNOSIS — Z510 Encounter for antineoplastic radiation therapy: Secondary | ICD-10-CM | POA: Diagnosis not present

## 2020-06-12 DIAGNOSIS — C50211 Malignant neoplasm of upper-inner quadrant of right female breast: Secondary | ICD-10-CM | POA: Diagnosis not present

## 2020-06-12 DIAGNOSIS — Z170 Estrogen receptor positive status [ER+]: Secondary | ICD-10-CM | POA: Diagnosis not present

## 2020-06-13 ENCOUNTER — Ambulatory Visit
Admission: RE | Admit: 2020-06-13 | Discharge: 2020-06-13 | Disposition: A | Discharge status: Home / Self Care | Payer: BC Managed Care – PPO | Source: Ambulatory Visit | Attending: Radiation Oncology | Admitting: Radiation Oncology

## 2020-06-13 DIAGNOSIS — Z510 Encounter for antineoplastic radiation therapy: Secondary | ICD-10-CM | POA: Diagnosis not present

## 2020-06-13 DIAGNOSIS — C50211 Malignant neoplasm of upper-inner quadrant of right female breast: Secondary | ICD-10-CM | POA: Diagnosis not present

## 2020-06-13 DIAGNOSIS — Z170 Estrogen receptor positive status [ER+]: Secondary | ICD-10-CM | POA: Diagnosis not present

## 2020-06-23 NOTE — Progress Notes (Signed)
[place]   [phone] [phone]   Clinic Follow up Note   Patient Care Team: Marval Regal, NP as PCP - General (Nurse Practitioner) Truitt Merle, MD as Consulting Physician (Hematology) Jovita Kussmaul, MD as Consulting Physician (General Surgery) Rockwell Germany, RN as Oncology Nurse Navigator Mauro Kaufmann, RN as Oncology Nurse Navigator Noreene Filbert, MD as Radiation Oncologist (Radiation Oncology)  Date of Service:  06/25/2020  CHIEF COMPLAINT: F/u of right breast cancer  SUMMARY OF ONCOLOGIC HISTORY: Oncology History Overview Note  Cancer Staging Malignant neoplasm of upper-inner quadrant of right breast in female, estrogen receptor positive ([place]) Staging form: Breast, AJCC 8th Edition - Clinical stage from 02/18/2020: Stage IA (cT1b, cN0, cM0, G1, ER+, PR+, HER2-) - Signed by Truitt Merle, MD on 03/02/2020    Malignant neoplasm of upper-inner quadrant of right breast in female, estrogen receptor positive ([facility])  02/05/2020 Mammogram   Mammogram 02/05/20  IMPRESSION: Right breast mass at 2 o'clock measuring 0.5 x 0.6 x 0.6 cm and 5cmfn is indeterminate.   02/18/2020 Cancer Staging   Staging form: Breast, AJCC 8th Edition - Clinical stage from 02/18/2020: Stage IA (cT1b, cN0, cM0, G1, ER+, PR+, HER2-) - Signed by Truitt Merle, MD on 03/02/2020   02/18/2020 Initial Biopsy   Diagnosis 02/18/20  Breast, right, needle core biopsy, 2 o'clock - INVASIVE DUCTAL CARCINOMA, GRADE 1. - SEE MICROSCOPIC DESCRIPTION Microscopic Comment The greatest tumor dimension is 0.7 cm. A breast prognostic profile will be performed. Immunohistochemistry of basal cell markers (Calponin, p63 and smooth muscle myosin) supports the diagnosis.   02/18/2020 Receptors her2   PROGNOSTIC INDICATORS Results: IMMUNOHISTOCHEMICAL AND MORPHOMETRIC ANALYSIS PERFORMED MANUALLY The tumor cells are NEGATIVE for Her2 (1+). Estrogen Receptor: 95%, POSITIVE, STRONG STAINING  INTENSITY Progesterone Receptor: 95%, POSITIVE, STRONG STAINING INTENSITY Proliferation Marker Ki67: 5%   03/02/2020 Initial Diagnosis   Malignant neoplasm of upper-inner quadrant of right breast in female, estrogen receptor positive ([place])   04/16/2020 Surgery   BREAST LUMPECTOMY WITH RADIOACTIVE SEED AND SENTINEL LYMPH NODE BIOPSY by Dr Marlou Starks    04/16/2020 Pathology Results   FINAL MICROSCOPIC DIAGNOSIS:   A. LYMPH NODE RIGHT AXILLARY #1, SENTINEL, EXCISION:  - Lymph node, negative for carcinoma (0/1)   B. BREAST, RIGHT, LUMPECTOMY:  - Invasive ductal carcinoma, 0.6 cm, grade 1, with calcifications  - Resection margins are negative for carcinoma; closest are the inferior  margin at 1.5 mm and superior margin at 4 mm  - Negative for lymphovascular or perineural invasion  - See oncology table   05/01/2020 Imaging   DEXA ASSESSMENT: The BMD measured at Femur Total Left is 0.889 g/cm2 with a T-score of -0.9. This patient is considered normal according to [facility]) criteria. The scan quality is good.   05/13/2020 - 06/13/2020 Radiation Therapy   Adjuvant Radiation with Dr Berton Mount in [facility]    05/2020 -  Anti-estrogen oral therapy   She declined antiestrogen therapy.       CURRENT THERAPY:  Surveillance   INTERVAL HISTORY:  Kelsey Simon is here for a follow up. She presents to the clinic alone. She notes she tolerated Radiation well. She has skin darkening, but no breakdown. She notes being mildly fatigued still, but recovering well. She notes since her prior PCP and NP is no longer available, she is still looking for new PCP. She has some refills in the meantime. I reviewed her medication list with her. She plans to  see Dr. Marlou Starks in 4 months. She notes with family stress, she has been using her Klonopin more often.     REVIEW OF SYSTEMS:   Constitutional: Denies fevers, chills or abnormal weight loss Eyes: Denies blurriness of  vision Ears, nose, mouth, throat, and face: Denies mucositis or sore throat Respiratory: Denies cough, dyspnea or wheezes Cardiovascular: Denies palpitation, chest discomfort or lower extremity swelling Gastrointestinal:  Denies nausea, heartburn or change in bowel habits Skin: Denies abnormal skin rashes Lymphatics: Denies new lymphadenopathy or easy bruising Neurological:Denies numbness, tingling or new weaknesses Behavioral/Psych: Mood is stable, no new changes  All other systems were reviewed with the patient and are negative.  MEDICAL HISTORY:  Past Medical History:  Diagnosis Date  . Hx of adenomatous colonic polyps 05/18/2018  . Hypertension   . IBS (irritable bowel syndrome)   . PONV (postoperative nausea and vomiting)   . Varicose veins     SURGICAL HISTORY: Past Surgical History:  Procedure Laterality Date  . BREAST BIOPSY Right 2017 and 2006   x 2  . BREAST EXCISIONAL BIOPSY    . BREAST LUMPECTOMY WITH RADIOACTIVE SEED AND SENTINEL LYMPH NODE BIOPSY Right 04/16/2020   Procedure: BREAST LUMPECTOMY WITH RADIOACTIVE SEED AND SENTINEL LYMPH NODE BIOPSY;  Surgeon: Jovita Kussmaul, MD;  Location: [place];  Service: General;  Laterality: Right;  . BREAST LUMPECTOMY WITH RADIOACTIVE SEED LOCALIZATION Right 02/13/2016   Procedure: RIGHT BREAST LUMPECTOMY WITH RADIOACTIVE SEED LOCALIZATION;  Surgeon: Autumn Messing III, MD;  Location: [place];  Service: General;  Laterality: Right;  . COLONOSCOPY    . NASAL SEPTUM SURGERY  1990  . UPPER GASTROINTESTINAL ENDOSCOPY    . WISDOM TOOTH EXTRACTION      I have reviewed the social history and family history with the patient and they are unchanged from previous note.  ALLERGIES:  is allergic to penicillins.  MEDICATIONS:  Current Outpatient Medications  Medication Sig Dispense Refill  . BIOTIN PO Take by mouth daily.    . cholecalciferol (VITAMIN D3) 25 MCG (1000 UT) tablet Take 1,000 Units by mouth  daily.    . clonazePAM (KLONOPIN) 0.5 MG tablet TAKE ONE-HALF TABLET BY MOUTH AS NEEDED FOR SLEEP 30 tablet 2  . Magnesium 250 MG TABS Take by mouth.    . Multiple Vitamin (MULTIVITAMIN) tablet Take 1 tablet by mouth daily.    Marland Kitchen triamterene-hydrochlorothiazide (MAXZIDE) 75-50 MG tablet Take 1 tablet by mouth daily. 90 tablet 3   No current facility-administered medications for this visit.    PHYSICAL EXAMINATION: ECOG PERFORMANCE STATUS: 0 - Asymptomatic  Vitals:   06/25/20 1426  BP: (!) 148/87  Pulse: 79  Resp: 12  Temp: 97.9 F (36.6 C)  SpO2: 100%   Filed Weights   06/25/20 1426  Weight: 135 lb 11.2 oz (61.6 kg)    GENERAL:alert, no distress and comfortable SKIN: skin color, texture, turgor are normal, no rashes or significant lesions EYES: normal, Conjunctiva are pink and non-injected, sclera clear  NECK: supple, thyroid normal size, non-tender, without nodularity LYMPH:  no palpable lymphadenopathy in the cervical, axillary  LUNGS: clear to auscultation and percussion with normal breathing effort HEART: regular rate & rhythm and no murmurs and no lower extremity edema ABDOMEN:abdomen soft, non-tender and normal bowel sounds Musculoskeletal:no cyanosis of digits and no clubbing  NEURO: alert & oriented x 3 with fluent speech, no focal motor/sensory deficits BREAST: S/p right lumpectomy: Surgical incision healed well with mild scar tissue (+) Skin  hyperpigmentation of right breast from RT. Left Breast exam benign.   LABORATORY DATA:  I have reviewed the data as listed CBC Latest Ref Rng & Units 05/28/2020 06/18/2019 02/25/2017  WBC 4.0 - 10.5 K/uL 6.1 5.4 4.0  Hemoglobin 12.0 - 15.0 g/dL 13.7 13.4 14.3  Hematocrit 36.0 - 46.0 % 40.0 41.1 42.5  Platelets 150 - 400 K/uL 276 286.0 315.0     CMP Latest Ref Rng & Units 04/14/2020 06/18/2019 02/27/2018  Glucose 70 - 99 mg/dL 101(H) 103(H) 87  BUN 8 - 23 mg/dL '9 15 13  ' Creatinine 0.44 - 1.00 mg/dL 0.74 0.67 0.76  Sodium  135 - 145 mmol/L 134(L) 133(L) 136  Potassium 3.5 - 5.1 mmol/L 4.1 3.5 3.5  Chloride 98 - 111 mmol/L 98 97 98  CO2 22 - 32 mmol/L '26 29 30  ' Calcium 8.9 - 10.3 mg/dL 9.3 9.2 9.3  Total Protein 6.0 - 8.3 g/dL - 6.4 6.6  Total Bilirubin 0.2 - 1.2 mg/dL - 0.5 0.4  Alkaline Phos 39 - 117 U/L - 37(L) 44  AST 0 - 37 U/L - 24 23  ALT 0 - 35 U/L - 22 19      RADIOGRAPHIC STUDIES: I have personally reviewed the radiological images as listed and agreed with the findings in the report. No results found.   ASSESSMENT & PLAN:  Kelsey Simon is a 65 y.o. female with    1. Malignant neoplasm of upper-inner quadrant of right breast, Stage IA, p(T1bN0M0), ER+/PR+/HER2-, Grade I  -She was diagnosed in 01/2020 with 0.6cm mass in the right breast and biopsy confirmed grade I invasive ductal carcinoma, ER and PR strongly positive, HER2(-).  -She underwent right lumpectomy with SLNB by Dr Marlou Starks on 04/16/20. Her surgical path showed 0.6cm invasive ductal carcinoma was completely resected, clear margins and node negative. Oncotype was not indicated. I reviewed with patient today.  -To reduce her risk of local cancer recurrence, she complete adjuvant Radiation 05/13/20-06/14/19. She tolerated well.  -Given the strong ER and PR expression in her postmenopausal status, I recommend adjuvant endocrine therapy with aromatase inhibitor exemestane for a total of 5 years to reduce the risk of cancer recurrence. Potential benefits and side effects were discussed with patient. We also discussed the option of tamoxifen.  -Today she declined antiestrogen therapy and rather work on [employer] and screening alone. She understand her risk of metastatic recurrence. Given her early stage, low grade disease she is fine to forego antiestrogen therapy.  -We also discussed the breast cancer surveillance. She will continue annual screening mammogram, self exams, and a routine office visit with lab and exam with [place].   -Next Mammogram in 01/2021. Will proceed with survivorship clinic with NP Lacie in 6 months.    2. Comorbidities: HTN, IBS, Anxiety  -Controlled on Maxzide and stomach medicine.  -She takes on Klonopin as needed. With recent family stress she has used more often. I refilled at #30 tabs (06/25/20).  -She is still looking to new PCP to take over her care.   3. Bone Health -Her baseline DEXA from 05/01/20 was normal with lowest T-score -0.9 at left total femur.  -She is very active, I encouraged her to continue.   PLAN:  -pt declined adjuvant antiestrogen therapy  -I refilled Maxzide, Klonopin #30 tabs today at [facility], while she is waiting to find a new PCP  -Mammogram in 01/2021.  -Virtual survivorship clinic with NP Lacie in 6 months    No  problem-specific Assessment & Plan notes found for this encounter.   No orders of the defined types were placed in this encounter.  All questions were answered. The patient knows to call the clinic with any problems, questions or concerns. No barriers to learning was detected. The total time spent in the appointment was 30 minutes.     Truitt Merle, MD 06/25/2020   I, Joslyn Devon, am acting as scribe for Truitt Merle, MD.   I have reviewed the above documentation for accuracy and completeness, and I agree with the above.

## 2020-06-25 ENCOUNTER — Other Ambulatory Visit: Payer: Self-pay

## 2020-06-25 ENCOUNTER — Encounter: Payer: Self-pay | Admitting: Hematology

## 2020-06-25 ENCOUNTER — Inpatient Hospital Stay: Payer: BC Managed Care – PPO | Attending: Hematology | Admitting: Hematology

## 2020-06-25 VITALS — BP 148/87 | HR 79 | Temp 97.9°F | Resp 12 | Ht 67.0 in | Wt 135.7 lb

## 2020-06-25 DIAGNOSIS — Z79899 Other long term (current) drug therapy: Secondary | ICD-10-CM | POA: Diagnosis not present

## 2020-06-25 DIAGNOSIS — K589 Irritable bowel syndrome without diarrhea: Secondary | ICD-10-CM | POA: Insufficient documentation

## 2020-06-25 DIAGNOSIS — Z170 Estrogen receptor positive status [ER+]: Secondary | ICD-10-CM | POA: Diagnosis not present

## 2020-06-25 DIAGNOSIS — I10 Essential (primary) hypertension: Secondary | ICD-10-CM | POA: Insufficient documentation

## 2020-06-25 DIAGNOSIS — F419 Anxiety disorder, unspecified: Secondary | ICD-10-CM | POA: Diagnosis not present

## 2020-06-25 DIAGNOSIS — C50211 Malignant neoplasm of upper-inner quadrant of right female breast: Secondary | ICD-10-CM | POA: Diagnosis not present

## 2020-06-25 MED ORDER — TRIAMTERENE-HCTZ 75-50 MG PO TABS
1.0000 | ORAL_TABLET | Freq: Every day | ORAL | 0 refills | Status: DC
Start: 2020-06-25 — End: 2020-09-24

## 2020-06-25 MED ORDER — CLONAZEPAM 0.5 MG PO TABS
ORAL_TABLET | ORAL | 0 refills | Status: DC
Start: 2020-06-25 — End: 2021-12-06

## 2020-06-26 ENCOUNTER — Telehealth: Payer: Self-pay | Admitting: Hematology

## 2020-06-26 NOTE — Telephone Encounter (Signed)
Scheduled appointment per 1/26 los. Will mail updated calendar to patient.

## 2020-07-14 ENCOUNTER — Ambulatory Visit
Admission: RE | Admit: 2020-07-14 | Discharge: 2020-07-14 | Disposition: A | Discharge status: Home / Self Care | Payer: BC Managed Care – PPO | Source: Ambulatory Visit | Attending: Radiation Oncology | Admitting: Radiation Oncology

## 2020-07-14 VITALS — BP 176/91 | HR 71 | Temp 98.6°F

## 2020-07-14 DIAGNOSIS — C50411 Malignant neoplasm of upper-outer quadrant of right female breast: Secondary | ICD-10-CM

## 2020-07-14 DIAGNOSIS — Z170 Estrogen receptor positive status [ER+]: Secondary | ICD-10-CM

## 2020-07-14 NOTE — Progress Notes (Signed)
Radiation Oncology Follow up Note  Name: Kelsey Simon   Date:   07/14/2020 MRN:  [id number] DOB: 1956/05/15    This 65 y.o. female presents to the clinic today for 1 month follow-up status post whole breast radiation to her right breast for stage I well-differentiated invasive mammary carcinoma ER/PR positive.  REFERRING PROVIDER: Marval Regal, NP  HPI: Patient is a 65 year old female now at 1 month having completed whole breast radiation to her right breast status post wide local excision for a grade 1 well-differentiated base of mammary carcinoma ER/PR positive.  Seen today in routine follow-up she is doing well she specifically denies breast tenderness cough or bone pain.  She has been offered antiestrogen therapy although it has declined that..  She is without complaints. COMPLICATIONS OF TREATMENT: none  FOLLOW UP COMPLIANCE: keeps appointments   PHYSICAL EXAM:  BP (!) 176/91   Pulse 71   Temp 98.6 F (37 C) (Tympanic)   SpO2 100% Comment: room air Lungs are clear to A&P cardiac examination essentially unremarkable with regular rate and rhythm. No dominant mass or nodularity is noted in either breast in 2 positions examined. Incision is well-healed. No axillary or supraclavicular adenopathy is appreciated. Cosmetic result is excellent.  Well-developed well-nourished patient in NAD. HEENT reveals PERLA, EOMI, discs not visualized.  Oral cavity is clear. No oral mucosal lesions are identified. Neck is clear without evidence of cervical or supraclavicular adenopathy. Lungs are clear to A&P. Cardiac examination is essentially unremarkable with regular rate and rhythm without murmur rub or thrill. Abdomen is benign with no organomegaly or masses noted. Motor sensory and DTR levels are equal and symmetric in the upper and lower extremities. Cranial nerves II through XII are grossly intact. Proprioception is intact. No peripheral adenopathy or edema is identified. No motor or sensory  levels are noted. Crude visual fields are within normal range.  RADIOLOGY RESULTS: No current films to review  PLAN: Present time patient is doing well 1 month out from whole breast radiation and pleased with her overall progress.  I have asked to see her back in 4 to 5 months for follow-up.  Patient knows to call with any concerns.  I would like to take this opportunity to thank you for allowing me to participate in the care of your patient.Noreene Filbert, MD

## 2020-07-24 ENCOUNTER — Telehealth: Payer: Self-pay | Admitting: Nurse Practitioner

## 2020-07-24 NOTE — Telephone Encounter (Signed)
Attempted to contact patient regarding moved upcoming appointment due to provider's template. Unable to leave voicemail. Mailed calendar.

## 2020-08-13 ENCOUNTER — Ambulatory Visit: Payer: BC Managed Care – PPO | Admitting: Internal Medicine

## 2020-08-13 ENCOUNTER — Encounter: Payer: Self-pay | Admitting: Internal Medicine

## 2020-08-13 ENCOUNTER — Other Ambulatory Visit: Payer: Self-pay

## 2020-08-13 DIAGNOSIS — Z0000 Encounter for general adult medical examination without abnormal findings: Secondary | ICD-10-CM | POA: Diagnosis not present

## 2020-08-13 DIAGNOSIS — I10 Essential (primary) hypertension: Secondary | ICD-10-CM

## 2020-08-13 NOTE — Progress Notes (Unsigned)
   Subjective:   Patient ID: Kelsey Simon, female    DOB: 1955/11/30, 65 y.o.   MRN: [id number]  HPI The patient is a 65 YO female coming in for transfer of care and physical. Recent breast cancer treatment.   PMH, [facility], social history reviewed and updated  Review of Systems  Constitutional: Negative.   HENT: Negative.   Eyes: Negative.   Respiratory: Negative for cough, chest tightness and shortness of breath.   Cardiovascular: Negative for chest pain, palpitations and leg swelling.  Gastrointestinal: Negative for abdominal distention, abdominal pain, constipation, diarrhea, nausea and vomiting.  Musculoskeletal: Negative.   Skin: Negative.   Neurological: Negative.   Psychiatric/Behavioral: Negative.     Objective:  Physical Exam Constitutional:      Appearance: She is well-developed.  HENT:     Head: Normocephalic and atraumatic.  Cardiovascular:     Rate and Rhythm: Normal rate and regular rhythm.  Pulmonary:     Effort: Pulmonary effort is normal. No respiratory distress.     Breath sounds: Normal breath sounds. No wheezing or rales.  Abdominal:     General: Bowel sounds are normal. There is no distension.     Palpations: Abdomen is soft.     Tenderness: There is no abdominal tenderness. There is no rebound.  Musculoskeletal:     Cervical back: Normal range of motion.  Skin:    General: Skin is warm and dry.  Neurological:     Mental Status: She is alert and oriented to person, place, and time.     Coordination: Coordination normal.     Vitals:   08/13/20 1006  BP: 122/80  Pulse: 72  Resp: 18  Temp: 98.2 F (36.8 C)  TempSrc: Oral  SpO2: 100%  Weight: 133 lb 3.2 oz (60.4 kg)  Height: 5\' 7"  (1.702 m)    This visit occurred during the SARS-CoV-2 public health emergency.  Safety protocols were in place, including screening questions prior to the visit, additional usage of staff PPE, and extensive cleaning of exam room while observing appropriate  contact time as indicated for disinfecting solutions.   Assessment & Plan:

## 2020-08-13 NOTE — Patient Instructions (Addendum)
April 5th 2022 or later you can get a 4th covid shot.   Health Maintenance, Female Adopting a healthy lifestyle and getting preventive care are important in promoting health and wellness. Ask your health care provider about:  The right schedule for you to have regular tests and exams.  Things you can do on your own to prevent diseases and keep yourself healthy. What should I know about diet, weight, and exercise? Eat a healthy diet  Eat a diet that includes plenty of vegetables, fruits, low-fat dairy products, and lean protein.  Do not eat a lot of foods that are high in solid fats, added sugars, or sodium.   Maintain a healthy weight Body mass index (BMI) is used to identify weight problems. It estimates body fat based on height and weight. Your health care provider can help determine your BMI and help you achieve or maintain a healthy weight. Get regular exercise Get regular exercise. This is one of the most important things you can do for your health. Most adults should:  Exercise for at least 150 minutes each week. The exercise should increase your heart rate and make you sweat (moderate-intensity exercise).  Do strengthening exercises at least twice a week. This is in addition to the moderate-intensity exercise.  Spend less time sitting. Even light physical activity can be beneficial. Watch cholesterol and blood lipids Have your blood tested for lipids and cholesterol at 67 years of age, then have this test every 5 years. Have your cholesterol levels checked more often if:  Your lipid or cholesterol levels are high.  You are older than 67 years of age.  You are at high risk for heart disease. What should I know about cancer screening? Depending on your health history and family history, you may need to have cancer screening at various ages. This may include screening for:  Breast cancer.  Cervical cancer.  Colorectal cancer.  Skin cancer.  Lung cancer. What should I  know about heart disease, diabetes, and high blood pressure? Blood pressure and heart disease  High blood pressure causes heart disease and increases the risk of stroke. This is more likely to develop in people who have high blood pressure readings, are of African descent, or are overweight.  Have your blood pressure checked: ? Every 3-5 years if you are 53-38 years of age. ? Every year if you are 64 years old or older. Diabetes Have regular diabetes screenings. This checks your fasting blood sugar level. Have the screening done:  Once every three years after age 93 if you are at a normal weight and have a low risk for diabetes.  More often and at a younger age if you are overweight or have a high risk for diabetes. What should I know about preventing infection? Hepatitis B If you have a higher risk for hepatitis B, you should be screened for this virus. Talk with your health care provider to find out if you are at risk for hepatitis B infection. Hepatitis C Testing is recommended for:  Everyone born from [birth year] through 1967.  Anyone with known risk factors for hepatitis C. Sexually transmitted infections (STIs)  Get screened for STIs, including gonorrhea and chlamydia, if: ? You are sexually active and are younger than 67 years of age. ? You are older than 67 years of age and your health care provider tells you that you are at risk for this type of infection. ? Your sexual activity has changed since you were last screened, and  you are at increased risk for chlamydia or gonorrhea. Ask your health care provider if you are at risk.  Ask your health care provider about whether you are at high risk for HIV. Your health care provider may recommend a prescription medicine to help prevent HIV infection. If you choose to take medicine to prevent HIV, you should first get tested for HIV. You should then be tested every 3 months for as long as you are taking the medicine. Pregnancy  If you are  about to stop having your period (premenopausal) and you may become pregnant, seek counseling before you get pregnant.  Take 400 to 800 micrograms (mcg) of folic acid every day if you become pregnant.  Ask for birth control (contraception) if you want to prevent pregnancy. Osteoporosis and menopause Osteoporosis is a disease in which the bones lose minerals and strength with aging. This can result in bone fractures. If you are 12 years old or older, or if you are at risk for osteoporosis and fractures, ask your health care provider if you should:  Be screened for bone loss.  Take a calcium or vitamin D supplement to lower your risk of fractures.  Be given hormone replacement therapy (HRT) to treat symptoms of menopause. Follow these instructions at home: Lifestyle  Do not use any products that contain nicotine or tobacco, such as cigarettes, e-cigarettes, and chewing tobacco. If you need help quitting, ask your health care provider.  Do not use street drugs.  Do not share needles.  Ask your health care provider for help if you need support or information about quitting drugs. Alcohol use  Do not drink alcohol if: ? Your health care provider tells you not to drink. ? You are pregnant, may be pregnant, or are planning to become pregnant.  If you drink alcohol: ? Limit how much you use to 0-1 drink a day. ? Limit intake if you are breastfeeding.  Be aware of how much alcohol is in your drink. In the [place] one drink equals one 12 oz bottle of beer (355 mL), one 5 oz glass of wine (148 mL), or one 1 oz glass of hard liquor (44 mL). General instructions  Schedule regular health, dental, and eye exams.  Stay current with your vaccines.  Tell your health care provider if: ? You often feel depressed. ? You have ever been abused or do not feel safe at home. Summary  Adopting a healthy lifestyle and getting preventive care are important in promoting health and wellness.  Follow  your health care provider's instructions about healthy diet, exercising, and getting tested or screened for diseases.  Follow your health care provider's instructions on monitoring your cholesterol and blood pressure. This information is not intended to replace advice given to you by your health care provider. Make sure you discuss any questions you have with your health care provider. Document Revised: 05/10/2018 Document Reviewed: 05/10/2018 Elsevier Patient Education  2021 [employer].

## 2020-08-15 NOTE — Assessment & Plan Note (Signed)
Flu shot declines today. Covid-19 up to date advised can get 4th dose in June 2022. Pneumonia declines today. Shingrix declines today. Tetanus up to date. Colonoscopy up to date. Mammogram up to date, pap smear aged out and dexa up to date. Counseled about sun safety and mole surveillance. Counseled about the dangers of distracted driving. Given 10 year screening recommendations.

## 2020-08-15 NOTE — Assessment & Plan Note (Signed)
BP at goal on hctz. Appears oncology is prescribing and dosage is above recommendation 25 mg maximum due to extra risk low K and no typical additional BP benefit. K stable on recent labs so okay to keep for now.

## 2020-09-08 ENCOUNTER — Ambulatory Visit: Payer: BC Managed Care – PPO | Attending: General Surgery

## 2020-09-08 ENCOUNTER — Other Ambulatory Visit: Payer: Self-pay

## 2020-09-08 DIAGNOSIS — Z170 Estrogen receptor positive status [ER+]: Secondary | ICD-10-CM | POA: Insufficient documentation

## 2020-09-08 DIAGNOSIS — C50211 Malignant neoplasm of upper-inner quadrant of right female breast: Secondary | ICD-10-CM | POA: Insufficient documentation

## 2020-09-08 NOTE — Therapy (Signed)
[place], [place], [address] Phone: [phone]   Fax:  [phone]  Physical Therapy Treatment  Patient Details  Name: Kelsey Simon MRN: [id number] Date of Birth: 01-Oct-1955 Referring Provider (PT): Dr. Autumn Messing   Encounter Date: 09/08/2020   PT End of Session - 09/08/20 1543    Visit Number 1   # unchanged due to screen only   Number of Visits 2    Date for PT Re-Evaluation 06/30/20    PT Start Time 1537    PT Stop Time 1543    PT Time Calculation (min) 6 min    Activity Tolerance Patient tolerated treatment well    Behavior During Therapy [facility] for tasks assessed/performed           Past Medical History:  Diagnosis Date  . Hx of adenomatous colonic polyps 05/18/2018  . Hypertension   . IBS (irritable bowel syndrome)   . PONV (postoperative nausea and vomiting)   . Varicose veins     Past Surgical History:  Procedure Laterality Date  . BREAST BIOPSY Right 2017 and 2006   x 2  . BREAST EXCISIONAL BIOPSY    . BREAST LUMPECTOMY WITH RADIOACTIVE SEED AND SENTINEL LYMPH NODE BIOPSY Right 04/16/2020   Procedure: BREAST LUMPECTOMY WITH RADIOACTIVE SEED AND SENTINEL LYMPH NODE BIOPSY;  Surgeon: Jovita Kussmaul, MD;  Location: [place];  Service: General;  Laterality: Right;  . BREAST LUMPECTOMY WITH RADIOACTIVE SEED LOCALIZATION Right 02/13/2016   Procedure: RIGHT BREAST LUMPECTOMY WITH RADIOACTIVE SEED LOCALIZATION;  Surgeon: Autumn Messing III, MD;  Location: [place];  Service: General;  Laterality: Right;  . COLONOSCOPY    . NASAL SEPTUM SURGERY  1990  . UPPER GASTROINTESTINAL ENDOSCOPY    . WISDOM TOOTH EXTRACTION      There were no vitals filed for this visit.   Subjective Assessment - 09/08/20 1538    Subjective Pt returns for her 3 month L-Dex screen.    Pertinent History Rt lumpectomy with SLNB on 04/16/20, currently undergoing radiation                   L-DEX FLOWSHEETS - 09/08/20 1500      L-DEX LYMPHEDEMA SCREENING   Measurement Type Unilateral    L-DEX MEASUREMENT EXTREMITY Upper Extremity    POSITION  Standing    DOMINANT SIDE Right    At Risk Side Right    BASELINE SCORE (UNILATERAL) 0.8    L-DEX SCORE (UNILATERAL) 1.4    VALUE CHANGE (UNILAT) 0.6                                  PT Long Term Goals - 03/04/20 1418      PT LONG TERM GOAL #1   Title Patient will demonstrate she has regained full shoulder ROM and function postoperatively compared to baseline    Time 8    Period Weeks    Status New    Target Date 04/29/20                 Plan - 09/08/20 1544    Clinical Impression Statement PT returns for her 3 month L-Dex screen. Her change from baseline of 0.6 is WNLs so no further treatment is required at this time except to cont every 3 month L-Dex screens which pt is agreeable to.    PT Next  Visit Plan Cont every 3 month L-Dex screens for up to 2 years from SLNB.    Consulted and Agree with Plan of Care Patient           Patient will benefit from skilled therapeutic intervention in order to improve the following deficits and impairments:     Visit Diagnosis: Malignant neoplasm of upper-inner quadrant of right breast in female, estrogen receptor positive ([place])     Problem List Patient Active Problem List   Diagnosis Date Noted  . Malignant neoplasm of upper-inner quadrant of right breast in female, estrogen receptor positive ([place]) 03/02/2020  . Routine general medical examination at a health care facility 01/20/2020  . Screening for osteoporosis 01/20/2020  . Hx of adenomatous colonic polyps 05/18/2018  . 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 Anxiety 01/04/2012  . Essential hypertension     Otelia Limes, PTA 09/08/2020, 3:45 PM  [place], [place], [address] Phone:  [phone]   Fax:  [phone]  Name: NATALYIA INNES MRN: [id number] Date of Birth: 10-18-1955

## 2020-09-23 ENCOUNTER — Other Ambulatory Visit: Payer: Self-pay | Admitting: Hematology

## 2020-09-23 ENCOUNTER — Encounter: Payer: Self-pay | Admitting: Internal Medicine

## 2020-09-24 MED ORDER — TRIAMTERENE-HCTZ 37.5-25 MG PO TABS: 1.0000 | ORAL_TABLET | Freq: Every day | ORAL | 3 refills | Status: DC

## 2020-10-17 ENCOUNTER — Encounter: Payer: Self-pay | Admitting: *Deleted

## 2020-11-17 DIAGNOSIS — C50211 Malignant neoplasm of upper-inner quadrant of right female breast: Secondary | ICD-10-CM | POA: Diagnosis not present

## 2020-11-17 DIAGNOSIS — Z170 Estrogen receptor positive status [ER+]: Secondary | ICD-10-CM | POA: Diagnosis not present

## 2020-12-08 ENCOUNTER — Ambulatory Visit: Payer: BC Managed Care – PPO | Attending: General Surgery | Admitting: Physical Therapy

## 2020-12-11 ENCOUNTER — Ambulatory Visit: Payer: BC Managed Care – PPO | Admitting: Radiation Oncology

## 2020-12-15 ENCOUNTER — Telehealth: Payer: Self-pay | Admitting: Radiation Oncology

## 2020-12-15 NOTE — Telephone Encounter (Signed)
Patient needs to reschedule appointment on 7/21. She is requesting next week if available, as late in the afternoon as possible.  Routing to RadOnc for rescheduling.

## 2020-12-18 ENCOUNTER — Ambulatory Visit: Payer: BC Managed Care – PPO | Admitting: Radiation Oncology

## 2020-12-23 ENCOUNTER — Ambulatory Visit
Admission: RE | Admit: 2020-12-23 | Discharge: 2020-12-23 | Disposition: A | Discharge status: Home / Self Care | Payer: BC Managed Care – PPO | Source: Ambulatory Visit | Attending: Radiation Oncology | Admitting: Radiation Oncology

## 2020-12-23 ENCOUNTER — Encounter: Payer: Self-pay | Admitting: Radiation Oncology

## 2020-12-23 VITALS — BP 139/88 | HR 71 | Temp 96.9°F | Resp 16 | Wt 129.0 lb

## 2020-12-23 DIAGNOSIS — Z853 Personal history of malignant neoplasm of breast: Secondary | ICD-10-CM | POA: Diagnosis not present

## 2020-12-23 DIAGNOSIS — Z923 Personal history of irradiation: Secondary | ICD-10-CM | POA: Insufficient documentation

## 2020-12-23 DIAGNOSIS — Z170 Estrogen receptor positive status [ER+]: Secondary | ICD-10-CM

## 2020-12-23 DIAGNOSIS — C50411 Malignant neoplasm of upper-outer quadrant of right female breast: Secondary | ICD-10-CM

## 2020-12-23 NOTE — Progress Notes (Signed)
Radiation Oncology Follow up Note  Name: Kelsey Simon   Date:   12/23/2020 MRN:  [id number] DOB: 1955/12/29    This 65 y.o. 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  REFERRING PROVIDER: MMarval Regal NP  HPI: Patient is a 65year old female now out 6 months having completed whole breast radiation to her right breast for stage I well-differentiated invasive mammary carcinoma ER/PR positive.  Seen today in routine follow-up she is doing well specifically denies breast tenderness cough or bone pain.  She has declined antiestrogen therapy.  She is not yet had a mammogram..  COMPLICATIONS OF TREATMENT: none  FOLLOW UP COMPLIANCE: keeps appointments   PHYSICAL EXAM:  BP 139/88 (BP Location: Left Arm, Patient Position: Sitting)   Pulse 71   Temp (!) 96.9 F (36.1 C) (Tympanic)   Resp 16   Wt 129 lb (58.5 kg)   BMI 20.20 kg/m  Lungs are clear to A&P cardiac examination essentially unremarkable with regular rate and rhythm. No dominant mass or nodularity is noted in either breast in 2 positions examined. Incision is well-healed. No axillary or supraclavicular adenopathy is appreciated. Cosmetic result is excellent.  Well-developed well-nourished patient in NAD. HEENT reveals PERLA, EOMI, discs not visualized.  Oral cavity is clear. No oral mucosal lesions are identified. Neck is clear without evidence of cervical or supraclavicular adenopathy. Lungs are clear to A&P. Cardiac examination is essentially unremarkable with regular rate and rhythm without murmur rub or thrill. Abdomen is benign with no organomegaly or masses noted. Motor sensory and DTR levels are equal and symmetric in the upper and lower extremities. Cranial nerves II through XII are grossly intact. Proprioception is intact. No peripheral adenopathy or edema is identified. No motor  or sensory levels are noted. Crude visual fields are within normal range.  RADIOLOGY RESULTS: No current films to review  PLAN: Present time she is doing well 6 months out with no evidence of disease.  She will have mammogram scheduled in the next 6 months.  Of asked to see her back in 6 months for follow-up.  Patient knows to call with any concerns at any time.  I would like to take this opportunity to thank you for allowing me to participate in the care of your patient..Noreene Filbert MD

## 2020-12-24 ENCOUNTER — Encounter: Payer: BC Managed Care – PPO | Admitting: Nurse Practitioner

## 2020-12-24 ENCOUNTER — Telehealth: Payer: Self-pay | Admitting: Nurse Practitioner

## 2020-12-24 NOTE — Telephone Encounter (Signed)
Rescheduled today's appointment per 7/27 secure chat. Patient is aware of changes.

## 2021-01-05 ENCOUNTER — Other Ambulatory Visit: Payer: Self-pay

## 2021-01-05 ENCOUNTER — Ambulatory Visit: Payer: BC Managed Care – PPO | Attending: General Surgery

## 2021-01-05 VITALS — Wt 128.1 lb

## 2021-01-05 DIAGNOSIS — Z483 Aftercare following surgery for neoplasm: Secondary | ICD-10-CM | POA: Insufficient documentation

## 2021-01-05 NOTE — Therapy (Signed)
[place], [place], [address] Phone: [phone]   Fax:  [phone]  Physical Therapy Treatment  Patient Details  Name: Kelsey Simon MRN: [id number] Date of Birth: 05/12/1956 Referring Provider (PT): Dr. Autumn Messing   Encounter Date: 01/05/2021   PT End of Session - 01/05/21 1033     Visit Number 1   # unchanged due to screen only   PT Start Time 1027    PT Stop Time 1034    PT Time Calculation (min) 7 min    Activity Tolerance Patient tolerated treatment well    Behavior During Therapy [facility] for tasks assessed/performed             Past Medical History:  Diagnosis Date   Hx of adenomatous colonic polyps 05/18/2018   Hypertension    IBS (irritable bowel syndrome)    PONV (postoperative nausea and vomiting)    Varicose veins     Past Surgical History:  Procedure Laterality Date   BREAST BIOPSY Right 2017 and 2006   x 2   BREAST EXCISIONAL BIOPSY     BREAST LUMPECTOMY WITH RADIOACTIVE SEED AND SENTINEL LYMPH NODE BIOPSY Right 04/16/2020   Procedure: BREAST LUMPECTOMY WITH RADIOACTIVE SEED AND SENTINEL LYMPH NODE BIOPSY;  Surgeon: Jovita Kussmaul, MD;  Location: [place];  Service: General;  Laterality: Right;   BREAST LUMPECTOMY WITH RADIOACTIVE SEED LOCALIZATION Right 02/13/2016   Procedure: RIGHT BREAST LUMPECTOMY WITH RADIOACTIVE SEED LOCALIZATION;  Surgeon: Autumn Messing III, MD;  Location: [place];  Service: General;  Laterality: Right;   COLONOSCOPY     NASAL SEPTUM SURGERY  1990   UPPER GASTROINTESTINAL ENDOSCOPY     WISDOM TOOTH EXTRACTION      Vitals:   01/05/21 1028  Weight: 128 lb 2 oz (58.1 kg)     Subjective Assessment - 01/05/21 1028     Subjective Pt returns for her 3 month L-Dex screen.    Pertinent History Rt lumpectomy with SLNB on 04/16/20, currently undergoing radiation                    L-DEX FLOWSHEETS - 01/05/21  1000       L-DEX LYMPHEDEMA SCREENING   Measurement Type Unilateral    L-DEX MEASUREMENT EXTREMITY Upper Extremity    POSITION  Standing    DOMINANT SIDE Right    At Risk Side Right    BASELINE SCORE (UNILATERAL) 0.8    L-DEX SCORE (UNILATERAL) -0.7    VALUE CHANGE (UNILAT) -1.5                                    PT Long Term Goals - 03/04/20 1418       PT LONG TERM GOAL #1   Title Patient will demonstrate she has regained full shoulder ROM and function postoperatively compared to baseline    Time 8    Period Weeks    Status New    Target Date 04/29/20                   Plan - 01/05/21 1034     Clinical Impression Statement Pt returns for her 3 month L-Dex screen. Her change from baseline of -1.5 is WNLs so no further treatment is required at this time except to cont every 3 month L-Dex screens which pt is agreeable to.  PT Next Visit Plan Cont every 3 month L-Dex screens for up to 2 years from SLNB.    Consulted and Agree with Plan of Care Patient             Patient will benefit from skilled therapeutic intervention in order to improve the following deficits and impairments:     Visit Diagnosis: Aftercare following surgery for neoplasm     Problem List Patient Active Problem List   Diagnosis Date Noted   Malignant neoplasm of upper-inner quadrant of right breast in female, estrogen receptor positive ([place]) 03/02/2020   Routine general medical examination at a health care facility 01/20/2020   Screening for osteoporosis 01/20/2020   Hx of adenomatous colonic polyps 05/18/2018   Varicose vein of leg 03/15/2012   Anxiety 01/04/2012   Essential hypertension     Otelia Limes, PTA 01/05/2021, 10:35 AM  [place], [place], [address] Phone: [phone]   Fax:  [phone]  Name: Kelsey Simon MRN: [id number] Date of Birth:  [birth year]

## 2021-01-20 ENCOUNTER — Inpatient Hospital Stay: Payer: BC Managed Care – PPO | Attending: Nurse Practitioner | Admitting: Nurse Practitioner

## 2021-01-20 ENCOUNTER — Encounter: Payer: Self-pay | Admitting: Nurse Practitioner

## 2021-01-20 DIAGNOSIS — Z170 Estrogen receptor positive status [ER+]: Secondary | ICD-10-CM | POA: Diagnosis not present

## 2021-01-20 DIAGNOSIS — C50211 Malignant neoplasm of upper-inner quadrant of right female breast: Secondary | ICD-10-CM | POA: Diagnosis not present

## 2021-01-20 NOTE — Progress Notes (Signed)
CLINIC: Survivorship  I connected with Kelsey Simon. Kelsey Simon on 01/20/21 at 11:00 AM EDT by Video and verified that I am speaking with the correct person using two identifiers.  I discussed the limitations, risks, security and privacy concerns of performing an evaluation and management service by telephone and the availability of in person appointments. I also discussed with the patient that there may be a patient responsible charge related to this service. The patient expressed understanding and agreed to proceed.   Other's participating in today's encounter: None  Patient's location: Home Provider's location: [place] office   BRIEF ONCOLOGIC HISTORY:  Oncology History Overview Note  Cancer Staging Malignant neoplasm of upper-inner quadrant of right breast in female, estrogen receptor positive ([place]) Staging form: Breast, AJCC 8th Edition - Clinical stage from 02/18/2020: Stage IA (cT1b, cN0, cM0, G1, ER+, PR+, HER2-) - Signed by Truitt Merle, MD on 03/02/2020    Malignant neoplasm of upper-inner quadrant of right breast in female, estrogen receptor positive ([place])  02/05/2020 Mammogram   Mammogram 02/05/20  IMPRESSION: Right breast mass at 2 o'clock measuring 0.5 x 0.6 x 0.6 cm and 5cmfn is indeterminate.   02/18/2020 Cancer Staging   Staging form: Breast, AJCC 8th Edition - Clinical stage from 02/18/2020: Stage IA (cT1b, cN0, cM0, G1, ER+, PR+, HER2-) - Signed by Truitt Merle, MD on 03/02/2020   02/18/2020 Initial Biopsy   Diagnosis 02/18/20  Breast, right, needle core biopsy, 2 o'clock - INVASIVE DUCTAL CARCINOMA, GRADE 1. - SEE MICROSCOPIC DESCRIPTION Microscopic Comment The greatest tumor dimension is 0.7 cm. A breast prognostic profile will be performed. Immunohistochemistry of basal cell markers (Calponin, p63 and smooth muscle myosin) supports the diagnosis.   02/18/2020 Receptors her2   PROGNOSTIC INDICATORS Results: IMMUNOHISTOCHEMICAL AND MORPHOMETRIC ANALYSIS PERFORMED MANUALLY The tumor  cells are NEGATIVE for Her2 (1+). Estrogen Receptor: 95%, POSITIVE, STRONG STAINING INTENSITY Progesterone Receptor: 95%, POSITIVE, STRONG STAINING INTENSITY Proliferation Marker Ki67: 5%   03/02/2020 Initial Diagnosis   Malignant neoplasm of upper-inner quadrant of right breast in female, estrogen receptor positive ([place])   04/16/2020 Surgery   BREAST LUMPECTOMY WITH RADIOACTIVE SEED AND SENTINEL LYMPH NODE BIOPSY by Dr Marlou Starks    04/16/2020 Pathology Results   FINAL MICROSCOPIC DIAGNOSIS:   A. LYMPH NODE RIGHT AXILLARY #1, SENTINEL, EXCISION:  - Lymph node, negative for carcinoma (0/1)   B. BREAST, RIGHT, LUMPECTOMY:  - Invasive ductal carcinoma, 0.6 cm, grade 1, with calcifications  - Resection margins are negative for carcinoma; closest are the inferior  margin at 1.5 mm and superior margin at 4 mm  - Negative for lymphovascular or perineural invasion  - See oncology table   04/16/2020 Cancer Staging   Staging form: Breast, AJCC 8th Edition - Pathologic stage from 04/16/2020: Stage IA (pT1b, pN0, cM0, G1, ER+, PR+, HER2-) - Signed by Alla Feeling, NP on 12/16/2020 Histologic grading system: 3 grade system   05/01/2020 Imaging   DEXA ASSESSMENT: The BMD measured at Femur Total Left is 0.889 g/cm2 with a T-score of -0.9. This patient is considered normal according to [facility]) criteria. The scan quality is good.   05/13/2020 - 06/13/2020 Radiation Therapy   Adjuvant Radiation with Dr Berton Mount in [facility]    05/2020 -  Anti-estrogen oral therapy   She declined antiestrogen therapy.    01/20/2021 Survivorship   SCP delivered virtually by Cira Rue, NP     INTERVAL HISTORY:  Kelsey Simon to review her survivorship care plan detailing her treatment  course for breast cancer, as well as monitoring long-term side effects of that treatment, education regarding health maintenance, screening, and overall wellness and health promotion.      Overall, Kelsey Simon feels well in general.  She was seen in person by Dr. Baruch Gouty with breast exam in July, skin has recovered well.  She has a few sharp pains at the scar, but not limiting activity.  She will see Dr. Marlou Starks early 2023.  She denies concerns in her breast such as new lump/mass, nipple discharge or inversion, or skin change.  She remains active, denies changes in her health or any specific complaints.   ONCOLOGY TREATMENT TEAM:  1. Surgeon:  Dr. [facility]ou [facility]arks at [facility] 2. Medical Oncologist: Dr. Burr Medico  3. Radiation Oncologist: Dr. Baruch Gouty    PAST MEDICAL/SURGICAL HISTORY:  Past Medical History:  Diagnosis Date   Hx of adenomatous colonic polyps 05/18/2018   Hypertension    IBS (irritable bowel syndrome)    PONV (postoperative nausea and vomiting)    Varicose veins    Past Surgical History:  Procedure Laterality Date   BREAST BIOPSY Right 2017 and 2006   x 2   BREAST EXCISIONAL BIOPSY     BREAST LUMPECTOMY WITH RADIOACTIVE SEED AND SENTINEL LYMPH NODE BIOPSY Right 04/16/2020   Procedure: BREAST LUMPECTOMY WITH RADIOACTIVE SEED AND SENTINEL LYMPH NODE BIOPSY;  Surgeon: Jovita Kussmaul, MD;  Location: [place];  Service: General;  Laterality: Right;   BREAST LUMPECTOMY WITH RADIOACTIVE SEED LOCALIZATION Right 02/13/2016   Procedure: RIGHT BREAST LUMPECTOMY WITH RADIOACTIVE SEED LOCALIZATION;  Surgeon: Autumn Messing III, MD;  Location: [place];  Service: General;  Laterality: Right;   COLONOSCOPY     NASAL SEPTUM SURGERY  1990   UPPER GASTROINTESTINAL ENDOSCOPY     WISDOM TOOTH EXTRACTION       ALLERGIES:  Allergies  Allergen Reactions   Penicillins Rash    REACTION: rash     CURRENT MEDICATIONS:  Outpatient Encounter Medications as of 01/20/2021  Medication Sig   b complex vitamins capsule Take 1 capsule by mouth daily.   BIOTIN PO Take by mouth daily.   cholecalciferol (VITAMIN D3) 25 MCG (1000 UT) tablet Take 1,000  Units by mouth daily.   clonazePAM (KLONOPIN) 0.5 MG tablet TAKE ONE-HALF TABLET BY MOUTH AS NEEDED FOR SLEEP (Patient not taking: Reported on 12/23/2020)   dicyclomine (BENTYL) 20 MG tablet Take 20 mg by mouth as needed. (Patient not taking: Reported on 12/23/2020)   Magnesium 250 MG TABS Take by mouth.   triamterene-hydrochlorothiazide (MAXZIDE-25) 37.5-25 MG tablet Take 1 tablet by mouth daily.   No facility-administered encounter medications on file as of 01/20/2021.     ONCOLOGIC FAMILY HISTORY:  Family History  Problem Relation Age of Onset   Heart attack Father    Lung cancer Father    Cancer Father        lung cancer   Drug abuse Daughter    Colon cancer Paternal Grandfather    Stomach cancer Neg Hx    Esophageal cancer Neg Hx    Rectal cancer Neg Hx    Liver cancer Neg Hx    Breast cancer Neg Hx    Colon polyps Neg Hx      GENETIC COUNSELING/TESTING: No  SOCIAL HISTORY:  Social History   Socioeconomic History   Marital status: Married    Spouse name: Not on file   Number of children: 1   Years of education:  Not on file   Highest education level: Not on file  Occupational History   Occupation: Freight forwarder  Tobacco Use   Smoking status: Former    Packs/day: 0.25    Years: 20.00    Pack years: 5.00    Types: Cigarettes    Quit date: 05/31/1993    Years since quitting: 27.6   Smokeless tobacco: Never   Tobacco comments:    quit smoking 25 years ago  Vaping Use   Vaping Use: Never used  Substance and Sexual Activity   Alcohol use: Yes    Alcohol/week: 7.0 standard drinks    Types: 7 Glasses of wine per week    Comment: wine daily   Drug use: No   Sexual activity: Yes  Other Topics Concern   Not on file  Social History Narrative   Not on file   Social Determinants of Health   Financial Resource Strain: Not on file  Food Insecurity: Not on file  Transportation Needs: Not on file  Physical Activity: Not on file  Stress: Not on file  Social  Connections: Not on file  Intimate Partner Violence: Not on file     OBSERVATIONS/OBJECTIVE:   Via video, Kelsey Simon appears well. No facial erythema or rash. Voice with strong with clear/fluent speech. Mood/affect are normal. No cough or conversational dyspnea. Breast exam deferred.   LABORATORY DATA:  None for this visit.  DIAGNOSTIC IMAGING:  None for this visit.      ASSESSMENT AND PLAN:  Kelsey Simon is a pleasant 65 y.o. 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 She presents to the Survivorship Clinic for our initial meeting and routine follow-up post-completion of treatment for breast cancer.    1. Stage IA right breast cancer:  Kelsey Simon has recovered well from definitive treatment for breast cancer. She had a recent breast exam by Dr. Baruch Gouty in July and will see Dr. Marlou Starks in a few months. She will follow-up with her medical oncologist, Dr. Burr Medico in 6 months with history and physical exam per surveillance protocol.  She declined anti-estrogen therapy due to side effect profile.  Her mammogram is due 01/2021; orders placed today.  Her breast density is category c. She is a good candidate for additional breast screening MRI due to her history, dense breast tissue, and that she declined adjuvant anti-estrogen therapy. Plan to do MRI in 07/2021 (6 months after mammo), pt agrees. Today, a comprehensive survivorship care plan and treatment summary was reviewed with the patient today detailing her breast cancer diagnosis, treatment course, potential late/long-term effects of treatment, appropriate follow-up care with recommendations for the future, and patient education resources.  A copy of this summary, along with a letter will be sent to the patient's primary care provider via In Basket message after today's visit.    2. Bone health:  Given Kelsey Simon's age/history, she should be  screening for decreased bone demineralization.  Her last DEXA scan was 04/2020 and was normal. She was encouraged to continue calcium, vitamin D, and weight bearing exercise. Repeat in 2 years, or longer given that she is not on AI. She was given education on specific activities to promote bone health.  3. Cancer screening:  Due to Kelsey Simon's history and her age, she should receive screening for skin cancers, colon cancer, and gynecologic cancers.  The information and recommendations are listed on the patient's comprehensive care plan/treatment summary and were  reviewed in detail with the patient.    4. Health maintenance and wellness promotion: Kelsey Simon was encouraged to consume 5-7 servings of fruits and vegetables per day. She was also encouraged to engage in moderate to vigorous exercise for 30 minutes per day most days of the week. She was instructed to limit her alcohol consumption and continue to abstain from tobacco use.     5. Support services/counseling: It is not uncommon for this period of the patient's cancer care trajectory to be one of many emotions and stressors.  We discussed how this can be increasingly difficult during the times of quarantine and social distancing due to the COVID-19 pandemic.   She was given information regarding our available services and encouraged to contact me with any questions or for help enrolling in any of our support group/programs.    Follow up instructions:    -Return to cancer center after screening MRI in 07/2021  -Mammogram due in 01/2021 -Follow up with surgery early 2023 as scheduled -She is welcome to return back to the Survivorship Clinic at any time; no additional follow-up needed at this time.  -Consider referral back to survivorship as a long-term survivor for continued surveillance The patient was provided an opportunity to ask questions and all were answered. The patient agreed with the plan and demonstrated an understanding of the  instructions.  The patient was advised to call back or seek an in-person evaluation if the symptoms worsen or if the condition fails to improve as anticipated.  I provided 25 minutes of non-face-to-face time during this encounter.   Alla Feeling, NP

## 2021-01-21 ENCOUNTER — Telehealth: Payer: Self-pay | Admitting: Hematology

## 2021-01-21 NOTE — Telephone Encounter (Signed)
Scheduled follow-up appointment per 8/23 los. Patient is aware. 

## 2021-04-06 ENCOUNTER — Other Ambulatory Visit: Payer: Self-pay

## 2021-04-06 ENCOUNTER — Ambulatory Visit
Admission: RE | Admit: 2021-04-06 | Discharge: 2021-04-06 | Disposition: A | Discharge status: Home / Self Care | Payer: BC Managed Care – PPO | Source: Ambulatory Visit | Attending: Nurse Practitioner | Admitting: Nurse Practitioner

## 2021-04-06 DIAGNOSIS — Z853 Personal history of malignant neoplasm of breast: Secondary | ICD-10-CM | POA: Diagnosis not present

## 2021-04-06 DIAGNOSIS — R922 Inconclusive mammogram: Secondary | ICD-10-CM | POA: Diagnosis not present

## 2021-04-06 DIAGNOSIS — C50211 Malignant neoplasm of upper-inner quadrant of right female breast: Secondary | ICD-10-CM

## 2021-04-13 ENCOUNTER — Ambulatory Visit: Payer: BC Managed Care – PPO

## 2021-05-26 ENCOUNTER — Ambulatory Visit: Payer: BC Managed Care – PPO

## 2021-05-27 ENCOUNTER — Encounter: Payer: Self-pay | Admitting: Internal Medicine

## 2021-05-27 ENCOUNTER — Other Ambulatory Visit: Payer: Self-pay | Admitting: Internal Medicine

## 2021-05-27 DIAGNOSIS — J208 Acute bronchitis due to other specified organisms: Secondary | ICD-10-CM | POA: Insufficient documentation

## 2021-05-27 DIAGNOSIS — U071 COVID-19: Secondary | ICD-10-CM

## 2021-05-27 MED ORDER — HYDROCOD POLST-CPM POLST ER 10-8 MG/5ML PO SUER: 5.0000 mL | Freq: Two times a day (BID) | ORAL | 0 refills | Status: DC | PRN

## 2021-06-08 ENCOUNTER — Other Ambulatory Visit: Payer: Self-pay

## 2021-06-08 ENCOUNTER — Ambulatory Visit: Payer: BC Managed Care – PPO | Attending: General Surgery

## 2021-06-08 VITALS — Wt 133.0 lb

## 2021-06-08 DIAGNOSIS — Z483 Aftercare following surgery for neoplasm: Secondary | ICD-10-CM | POA: Insufficient documentation

## 2021-06-08 NOTE — Therapy (Signed)
[facility] @ [address] [place], [place], [address] Phone: [phone]   Fax:  [phone]  Physical Therapy Treatment  Patient Details  Name: Kelsey Simon MRN: [id number] Date of Birth: [birth year] No data recorded  Encounter Date: 06/08/2021   PT End of Session - 06/08/21 1531     Visit Number 1   # unchanged due to screen only   PT Start Time 1529    PT Stop Time 1534    PT Time Calculation (min) 5 min    Activity Tolerance Patient tolerated treatment well    Behavior During Therapy [facility] for tasks assessed/performed             Past Medical History:  Diagnosis Date   Hx of adenomatous colonic polyps 05/18/2018   Hypertension    IBS (irritable bowel syndrome)    PONV (postoperative nausea and vomiting)    Varicose veins     Past Surgical History:  Procedure Laterality Date   BREAST BIOPSY Right 2017 and 2006   x 2   BREAST EXCISIONAL BIOPSY     BREAST LUMPECTOMY WITH RADIOACTIVE SEED AND SENTINEL LYMPH NODE BIOPSY Right 04/16/2020   Procedure: BREAST LUMPECTOMY WITH RADIOACTIVE SEED AND SENTINEL LYMPH NODE BIOPSY;  Surgeon: Jovita Kussmaul, MD;  Location: [place];  Service: General;  Laterality: Right;   BREAST LUMPECTOMY WITH RADIOACTIVE SEED LOCALIZATION Right 02/13/2016   Procedure: RIGHT BREAST LUMPECTOMY WITH RADIOACTIVE SEED LOCALIZATION;  Surgeon: Autumn Messing III, MD;  Location: [place];  Service: General;  Laterality: Right;   COLONOSCOPY     NASAL SEPTUM SURGERY  1990   UPPER GASTROINTESTINAL ENDOSCOPY     WISDOM TOOTH EXTRACTION      Vitals:   06/08/21 1531  Weight: 133 lb (60.3 kg)     Subjective Assessment - 06/08/21 1531     Subjective Pt returns for her 3 month L-Dex screen.    Pertinent History Rt lumpectomy with SLNB on 04/16/20, currently undergoing radiation                    L-DEX FLOWSHEETS - 06/08/21 1500       L-DEX LYMPHEDEMA  SCREENING   Measurement Type Unilateral    L-DEX MEASUREMENT EXTREMITY Upper Extremity    POSITION  Standing    DOMINANT SIDE Right    At Risk Side Right    BASELINE SCORE (UNILATERAL) 0.8    L-DEX SCORE (UNILATERAL) 3.1    VALUE CHANGE (UNILAT) 2.3                                     PT Long Term Goals - 03/04/20 1418       PT LONG TERM GOAL #1   Title Patient will demonstrate she has regained full shoulder ROM and function postoperatively compared to baseline    Time 8    Period Weeks    Status New    Target Date 04/29/20                   Plan - 06/08/21 1533     Clinical Impression Statement Pt returns for her 3 month L-Dex screen. Her change from baseline of 2.3 is WNLs so no further treatment is required at this time except to cont every 3 month L-Dex screens which pt is agreeable to.  PT Next Visit Plan Cont every 3 month L-Dex screens for up to 2 years from SLNB (~04/16/2022)    Consulted and Agree with Plan of Care Patient             Patient will benefit from skilled therapeutic intervention in order to improve the following deficits and impairments:     Visit Diagnosis: Aftercare following surgery for neoplasm     Problem List Patient Active Problem List   Diagnosis Date Noted   Acute bronchitis due to COVID-19 virus 05/27/2021   Malignant neoplasm of upper-inner quadrant of right breast in female, estrogen receptor positive ([place]) 03/02/2020   Routine general medical examination at a health care facility 01/20/2020   Screening for osteoporosis 01/20/2020   Hx of adenomatous colonic polyps 05/18/2018   Varicose vein of leg 03/15/2012   Anxiety 01/04/2012   Essential hypertension     Otelia Limes, PTA 06/08/2021, 3:38 PM  [facility] @ [address] [place], [place], [address] Phone: [phone]   Fax:  [phone]  Name: Kelsey Simon MRN: [id number] Date of Birth: 1956/04/17

## 2021-06-29 ENCOUNTER — Ambulatory Visit: Payer: BC Managed Care – PPO | Admitting: Radiation Oncology

## 2021-07-14 ENCOUNTER — Encounter (HOSPITAL_COMMUNITY): Payer: Self-pay

## 2021-07-14 ENCOUNTER — Telehealth: Payer: Self-pay | Admitting: Hematology

## 2021-07-14 NOTE — Telephone Encounter (Signed)
Sch per 2/12 inbasket, pt aware

## 2021-07-24 ENCOUNTER — Other Ambulatory Visit: Payer: BC Managed Care – PPO

## 2021-07-24 ENCOUNTER — Ambulatory Visit: Payer: BC Managed Care – PPO | Admitting: Hematology

## 2021-08-07 ENCOUNTER — Ambulatory Visit
Admission: RE | Admit: 2021-08-07 | Discharge: 2021-08-07 | Disposition: A | Discharge status: Home / Self Care | Payer: BC Managed Care – PPO | Source: Ambulatory Visit | Attending: Nurse Practitioner | Admitting: Nurse Practitioner

## 2021-08-07 DIAGNOSIS — C50211 Malignant neoplasm of upper-inner quadrant of right female breast: Secondary | ICD-10-CM

## 2021-08-07 DIAGNOSIS — Z853 Personal history of malignant neoplasm of breast: Secondary | ICD-10-CM | POA: Diagnosis not present

## 2021-08-07 MED ORDER — GADOBUTROL 1 MMOL/ML IV SOLN
6.0000 mL | Freq: Once | INTRAVENOUS | Status: AC | PRN
  Administered 2021-08-07: 6 mL via INTRAVENOUS

## 2021-08-14 ENCOUNTER — Inpatient Hospital Stay: Payer: BC Managed Care – PPO | Attending: Nurse Practitioner

## 2021-08-14 ENCOUNTER — Encounter: Payer: Self-pay | Admitting: Internal Medicine

## 2021-08-14 ENCOUNTER — Inpatient Hospital Stay: Payer: BC Managed Care – PPO | Admitting: Hematology

## 2021-08-14 ENCOUNTER — Other Ambulatory Visit: Payer: Self-pay

## 2021-08-14 ENCOUNTER — Encounter: Payer: Self-pay | Admitting: Hematology

## 2021-08-14 ENCOUNTER — Ambulatory Visit (INDEPENDENT_AMBULATORY_CARE_PROVIDER_SITE_OTHER): Payer: BC Managed Care – PPO | Admitting: Internal Medicine

## 2021-08-14 ENCOUNTER — Other Ambulatory Visit: Payer: Self-pay | Admitting: *Deleted

## 2021-08-14 VITALS — BP 118/82 | HR 75 | Resp 18 | Ht 67.0 in | Wt 135.4 lb

## 2021-08-14 VITALS — BP 143/89 | HR 75 | Resp 17 | Wt 134.3 lb

## 2021-08-14 DIAGNOSIS — Z853 Personal history of malignant neoplasm of breast: Secondary | ICD-10-CM

## 2021-08-14 DIAGNOSIS — Z79899 Other long term (current) drug therapy: Secondary | ICD-10-CM | POA: Insufficient documentation

## 2021-08-14 DIAGNOSIS — C50211 Malignant neoplasm of upper-inner quadrant of right female breast: Secondary | ICD-10-CM

## 2021-08-14 DIAGNOSIS — Z923 Personal history of irradiation: Secondary | ICD-10-CM | POA: Diagnosis not present

## 2021-08-14 DIAGNOSIS — K589 Irritable bowel syndrome without diarrhea: Secondary | ICD-10-CM | POA: Insufficient documentation

## 2021-08-14 DIAGNOSIS — Z170 Estrogen receptor positive status [ER+]: Secondary | ICD-10-CM

## 2021-08-14 DIAGNOSIS — F419 Anxiety disorder, unspecified: Secondary | ICD-10-CM | POA: Diagnosis not present

## 2021-08-14 DIAGNOSIS — Z23 Encounter for immunization: Secondary | ICD-10-CM | POA: Diagnosis not present

## 2021-08-14 DIAGNOSIS — I10 Essential (primary) hypertension: Secondary | ICD-10-CM | POA: Insufficient documentation

## 2021-08-14 DIAGNOSIS — Z0000 Encounter for general adult medical examination without abnormal findings: Secondary | ICD-10-CM

## 2021-08-14 LAB — CMP (CANCER CENTER ONLY)
ALT: 18 U/L (ref 0–44)
AST: 20 U/L (ref 15–41)
Albumin: 4.2 g/dL (ref 3.5–5.0)
Alkaline Phosphatase: 42 U/L (ref 38–126)
Anion gap: 5 (ref 5–15)
BUN: 15 mg/dL (ref 8–23)
CO2: 31 mmol/L (ref 22–32)
Calcium: 9.4 mg/dL (ref 8.9–10.3)
Chloride: 101 mmol/L (ref 98–111)
Creatinine: 0.77 mg/dL (ref 0.44–1.00)
GFR, Estimated: >60 mL/min (ref >60)
Glucose, Bld: 98 mg/dL (ref 70–99)
Potassium: 4 mmol/L (ref 3.5–5.1)
Sodium: 137 mmol/L (ref 135–145)
Total Bilirubin: 0.5 mg/dL (ref 0.3–1.2)
Total Protein: 6.7 g/dL (ref 6.5–8.1)

## 2021-08-14 LAB — CBC WITH DIFFERENTIAL (CANCER CENTER ONLY)
Abs Immature Granulocytes: 0.01 10*3/uL (ref 0.00–0.07)
Basophils Absolute: 0 10*3/uL (ref 0.0–0.1)
Basophils Relative: 1 %
Eosinophils Absolute: 0.1 10*3/uL (ref 0.0–0.5)
Eosinophils Relative: 2 %
HCT: 40.3 % (ref 36.0–46.0)
Hemoglobin: 13.3 g/dL (ref 12.0–15.0)
Immature Granulocytes: 0 %
Lymphocytes Relative: 20 %
Lymphs Abs: 0.9 10*3/uL (ref 0.7–4.0)
MCH: 27.5 pg (ref 26.0–34.0)
MCHC: 33 g/dL (ref 30.0–36.0)
MCV: 83.3 fL (ref 80.0–100.0)
Monocytes Absolute: 0.4 10*3/uL (ref 0.1–1.0)
Monocytes Relative: 8 %
Neutro Abs: 3.2 10*3/uL (ref 1.7–7.7)
Neutrophils Relative %: 69 %
Platelet Count: 273 10*3/uL (ref 150–400)
RBC: 4.84 MIL/uL (ref 3.87–5.11)
RDW: 12.4 % (ref 11.5–15.5)
WBC Count: 4.6 10*3/uL (ref 4.0–10.5)
nRBC: 0 % (ref 0.0–0.2)

## 2021-08-14 NOTE — Assessment & Plan Note (Signed)
Flu shot up to date. Covid-19 up to date. Pneumonia 20 given at visit. Shingrix given 1st at visit. Tetanus up to date. Colonoscopy up to date. Mammogram up to date, pap smear aged out and dexa due 2026. Counseled about sun safety and mole surveillance. Counseled about the dangers of distracted driving. Given 10 year screening recommendations.  ? ?

## 2021-08-14 NOTE — Progress Notes (Signed)
? ?  Subjective:  ? ?Patient ID: Kelsey Simon, female    DOB: 1956/01/22, 66 y.o.   MRN: [id number] ? ?HPI ?The patient is here for physical. ? ?PMH, [facility], social history reviewed and updated ? ?Review of Systems  ?Constitutional: Negative.   ?HENT: Negative.    ?Eyes: Negative.   ?Respiratory:  Negative for cough, chest tightness and shortness of breath.   ?Cardiovascular:  Negative for chest pain, palpitations and leg swelling.  ?Gastrointestinal:  Negative for abdominal distention, abdominal pain, constipation, diarrhea, nausea and vomiting.  ?Musculoskeletal: Negative.   ?Skin: Negative.   ?Neurological: Negative.   ?Psychiatric/Behavioral: Negative.    ? ?Objective:  ?Physical Exam ?Constitutional:   ?   Appearance: She is well-developed.  ?HENT:  ?   Head: Normocephalic and atraumatic.  ?Cardiovascular:  ?   Rate and Rhythm: Normal rate and regular rhythm.  ?Pulmonary:  ?   Effort: Pulmonary effort is normal. No respiratory distress.  ?   Breath sounds: Normal breath sounds. No wheezing or rales.  ?Abdominal:  ?   General: Bowel sounds are normal. There is no distension.  ?   Palpations: Abdomen is soft.  ?   Tenderness: There is no abdominal tenderness. There is no rebound.  ?Musculoskeletal:  ?   Cervical back: Normal range of motion.  ?Skin: ?   General: Skin is warm and dry.  ?Neurological:  ?   Mental Status: She is alert and oriented to person, place, and time.  ?   Coordination: Coordination normal.  ? ? ?Vitals:  ? 08/14/21 1509  ?BP: 118/82  ?Pulse: 75  ?Resp: 18  ?SpO2: 100%  ?Weight: 135 lb 6.4 oz (61.4 kg)  ?Height: '5\' 7"'$  (1.702 m)  ? ? ?This visit occurred during the SARS-CoV-2 public health emergency.  Safety protocols were in place, including screening questions prior to the visit, additional usage of staff PPE, and extensive cleaning of exam room while observing appropriate contact time as indicated for disinfecting solutions.  ? ?Assessment & Plan:  ?Shingrix and prevnar 20 given at visit ?

## 2021-08-14 NOTE — Assessment & Plan Note (Signed)
BP at goal on triamterene/hctz 37.5/25 mg daily and recent CMP reviewed from oncology without indication for change. Continue at current dose.  ?

## 2021-08-14 NOTE — Progress Notes (Signed)
?[facility]   ?[phone] [phone]   ?Clinic Follow up Note  ? ?Patient Care Team: ?Hoyt Koch, MD as PCP - General (Internal Medicine) ?Truitt Merle, MD as Consulting Physician (Hematology) ?Jovita Kussmaul, MD as Consulting Physician (General Surgery) ?Rockwell Germany, RN as Oncology Nurse Navigator ?Mauro Kaufmann, RN as Oncology Nurse Navigator ?Noreene Filbert, MD as Radiation Oncologist (Radiation Oncology) ?Alla Feeling, NP as Nurse Practitioner (Nurse Practitioner) ? ?Date of Service:  08/14/2021 ? ?CHIEF COMPLAINT: f/u of right breast cancer ? ?CURRENT THERAPY:  ?Surveillance ? ?ASSESSMENT & PLAN:  ?Kelsey Simon is a 66 y.o. post-menopausal female with  ? ?1. Malignant neoplasm of upper-inner quadrant of right breast, Stage IA, p(T1bN0M0), ER+/PR+/HER2-, Grade I  ?-diagnosed in 01/2020 with grade I invasive ductal carcinoma, ER and PR strongly positive, HER2(-).  ?-right lumpectomy with SLNB by Dr Marlou Starks on 04/16/20 showed 0.6cm invasive ductal carcinoma was completely resected, clear margins and node negative. Oncotype was not indicated.  ?-she completed adjuvant Radiation 05/13/20-06/14/19. She tolerated well.  ?-she declined antiestrogen therapy. She is now on intensive screening.  ?-most recent mammogram on 04/06/21 and MRI on 08/07/21 were both negative/benign. ?-she is clinically doing very well. Labs reviewed, WNL. Physical exam was unremarkable. There is no clinical concern for recurrence. ?  ?2. Comorbidities: HTN, IBS, Anxiety  ?-Controlled on Maxzide and stomach medicine.  ?-She takes on Klonopin as needed. With recent family stress she has used more often.  ?-She is still looking to new PCP to take over her care.  ?  ?3. Bone Health ?-Her baseline DEXA from 05/01/20 was normal with lowest T-score -0.9 at left total femur.  ?-She is very active, I encouraged her to continue.   ?  ? ?PLAN:  ?-continue cancer surveillance  ?-lab and f/u with NP Lacie  in 6 months  ? ? ?No problem-specific Assessment & Plan notes found for this encounter. ? ? ?SUMMARY OF ONCOLOGIC HISTORY: ?Oncology History Overview Note  ?Cancer Staging ?Malignant neoplasm of upper-inner quadrant of right breast in female, estrogen receptor positive ([place]) ?Staging form: Breast, AJCC 8th Edition ?- Clinical stage from 02/18/2020: Stage IA (cT1b, cN0, cM0, G1, ER+, PR+, HER2-) - Signed by Truitt Merle, MD on 03/02/2020 ? ?  ?HX: breast cancer  ?02/05/2020 Mammogram  ? Mammogram 02/05/20  ?IMPRESSION: ?Right breast mass at 2 o'clock measuring 0.5 ?x 0.6 x 0.6 cm and 5cmfn is indeterminate. ?  ?02/18/2020 Cancer Staging  ? Staging form: Breast, AJCC 8th Edition ?- Clinical stage from 02/18/2020: Stage IA (cT1b, cN0, cM0, G1, ER+, PR+, HER2-) - Signed by Truitt Merle, MD on 03/02/2020 ? ?  ?02/18/2020 Initial Biopsy  ? Diagnosis 02/18/20  ?Breast, right, needle core biopsy, 2 o'clock ?- INVASIVE DUCTAL CARCINOMA, GRADE 1. ?- SEE MICROSCOPIC DESCRIPTION ?Microscopic Comment ?The greatest tumor dimension is 0.7 cm. A breast prognostic profile will be performed. Immunohistochemistry of ?basal cell markers (Calponin, p63 and smooth muscle myosin) supports the diagnosis. ?  ?02/18/2020 Receptors her2  ? PROGNOSTIC INDICATORS ?Results: ?IMMUNOHISTOCHEMICAL AND MORPHOMETRIC ANALYSIS PERFORMED MANUALLY ?The tumor cells are NEGATIVE for Her2 (1+). ?Estrogen Receptor: 95%, POSITIVE, STRONG STAINING INTENSITY ?Progesterone Receptor: 95%, POSITIVE, STRONG STAINING INTENSITY ?Proliferation Marker Ki67: 5% ?  ?03/02/2020 Initial Diagnosis  ? Malignant neoplasm of upper-inner quadrant of right breast in female, estrogen receptor positive ([place]) ?  ?04/16/2020 Surgery  ? BREAST LUMPECTOMY WITH RADIOACTIVE SEED AND SENTINEL LYMPH NODE BIOPSY by Dr Marlou Starks  ?  ?  04/16/2020 Pathology Results  ? FINAL MICROSCOPIC DIAGNOSIS:  ? ?A. LYMPH NODE RIGHT AXILLARY #1, SENTINEL, EXCISION:  ?- Lymph node, negative for carcinoma (0/1)  ? ?B. BREAST,  RIGHT, LUMPECTOMY:  ?- Invasive ductal carcinoma, 0.6 cm, grade 1, with calcifications  ?- Resection margins are negative for carcinoma; closest are the inferior  ?margin at 1.5 mm and superior margin at 4 mm  ?- Negative for lymphovascular or perineural invasion  ?- See oncology table ?  ?04/16/2020 Cancer Staging  ? Staging form: Breast, AJCC 8th Edition ?- Pathologic stage from 04/16/2020: Stage IA (pT1b, pN0, cM0, G1, ER+, PR+, HER2-) - Signed by Alla Feeling, NP on 12/16/2020 ?Histologic grading system: 3 grade system ? ?  ?05/01/2020 Imaging  ? DEXA ?ASSESSMENT: ?The BMD measured at Femur Total Left is 0.889 g/cm2 with a T-score ?of -0.9. This patient is considered normal according to [place] ?Organization (WHO) criteria. The scan quality is good. ?  ?05/13/2020 - 06/13/2020 Radiation Therapy  ? Adjuvant Radiation with Dr Berton Mount in [place]  ?  ?05/2020 -  Anti-estrogen oral therapy  ? She declined antiestrogen therapy.  ?  ?01/20/2021 Survivorship  ? SCP delivered virtually by Cira Rue, NP ?  ? ? ? ?INTERVAL HISTORY:  ?Kelsey Simon is here for a follow up of breast cancer. She was last seen by me on 06/25/20 with virtual survivorship visit in the interim. She presents to the clinic alone. ?She reports she still has some breast pains related to surgery. ?She notes she has not retired yet and adds she walks a lot. She notes her job is "irritating," despite that she enjoys it. In addition to this, she has some other life stressors. ?  ?All other systems were reviewed with the patient and are negative. ? ?MEDICAL HISTORY:  ?Past Medical History:  ?Diagnosis Date  ? Hx of adenomatous colonic polyps 05/18/2018  ? Hypertension   ? IBS (irritable bowel syndrome)   ? PONV (postoperative nausea and vomiting)   ? Varicose veins   ? ? ?SURGICAL HISTORY: ?Past Surgical History:  ?Procedure Laterality Date  ? BREAST BIOPSY Right 2017 and 2006  ? x 2  ? BREAST EXCISIONAL BIOPSY    ? BREAST  LUMPECTOMY WITH RADIOACTIVE SEED AND SENTINEL LYMPH NODE BIOPSY Right 04/16/2020  ? Procedure: BREAST LUMPECTOMY WITH RADIOACTIVE SEED AND SENTINEL LYMPH NODE BIOPSY;  Surgeon: Jovita Kussmaul, MD;  Location: [facility];  Service: General;  Laterality: Right;  ? BREAST LUMPECTOMY WITH RADIOACTIVE SEED LOCALIZATION Right 02/13/2016  ? Procedure: RIGHT BREAST LUMPECTOMY WITH RADIOACTIVE SEED LOCALIZATION;  Surgeon: Autumn Messing III, MD;  Location: [facility];  Service: General;  Laterality: Right;  ? COLONOSCOPY    ? NASAL SEPTUM SURGERY  1990  ? UPPER GASTROINTESTINAL ENDOSCOPY    ? WISDOM TOOTH EXTRACTION    ? ? ?I have reviewed the social history and family history with the patient and they are unchanged from previous note. ? ?ALLERGIES:  is allergic to penicillins. ? ?MEDICATIONS:  ?Current Outpatient Medications  ?Medication Sig Dispense Refill  ? b complex vitamins capsule Take 1 capsule by mouth daily.    ? BIOTIN PO Take by mouth daily.    ? cholecalciferol (VITAMIN D3) 25 MCG (1000 UT) tablet Take 1,000 Units by mouth daily.    ? clonazePAM (KLONOPIN) 0.5 MG tablet TAKE ONE-HALF TABLET BY MOUTH AS NEEDED FOR SLEEP 30 tablet 0  ? dicyclomine (BENTYL) 20 MG tablet  Take 20 mg by mouth as needed.    ? Magnesium 250 MG TABS Take by mouth.    ? triamterene-hydrochlorothiazide (MAXZIDE-25) 37.5-25 MG tablet Take 1 tablet by mouth daily. 90 tablet 3  ? ?No current facility-administered medications for this visit.  ? ? ?PHYSICAL EXAMINATION: ?ECOG PERFORMANCE STATUS: 0 - Asymptomatic ? ?Vitals:  ? 08/14/21 1314  ?BP: (!) 143/89  ?Pulse: 75  ?Resp: 17  ?SpO2: 100%  ? ?Wt Readings from Last 3 Encounters:  ?08/14/21 135 lb 6.4 oz (61.4 kg)  ?08/14/21 134 lb 5 oz (60.9 kg)  ?06/08/21 133 lb (60.3 kg)  ?  ? ?GENERAL:alert, no distress and comfortable ?SKIN: skin color, texture, turgor are normal, no rashes or significant lesions ?EYES: normal, Conjunctiva are pink and non-injected, sclera clear   ?NECK: supple, thyroid normal size, non-tender, without nodularity ?LYMPH:  no palpable lymphadenopathy in the cervical, axillary ?LUNGS: clear to auscultation and percussion with normal breathing effort ?HEART: reg

## 2021-08-14 NOTE — Assessment & Plan Note (Signed)
Recent MRI reviewed with her normal. Needs diagnostic mammogram 03/2022 and then will return to yearly screening. Not on current treatment.  ?

## 2021-08-17 ENCOUNTER — Telehealth: Payer: Self-pay | Admitting: Hematology

## 2021-08-17 NOTE — Telephone Encounter (Signed)
Left message with follow-up appointment per 3/17 los. ?

## 2021-09-03 ENCOUNTER — Other Ambulatory Visit: Payer: Self-pay | Admitting: Internal Medicine

## 2021-09-07 ENCOUNTER — Ambulatory Visit: Payer: BC Managed Care – PPO

## 2021-10-05 ENCOUNTER — Ambulatory Visit: Payer: BC Managed Care – PPO | Attending: General Surgery

## 2021-10-05 VITALS — Wt 134.1 lb

## 2021-10-05 DIAGNOSIS — Z483 Aftercare following surgery for neoplasm: Secondary | ICD-10-CM | POA: Insufficient documentation

## 2021-10-05 NOTE — Therapy (Signed)
?  OUTPATIENT PHYSICAL THERAPY SOZO SCREENING NOTE ? ? ?Patient Name: Kelsey Simon ?MRN: [id number] ?DOB:12-01-1955, 66 y.o., female ?Today's Date: 10/05/2021 ? ?PCP: Hoyt Koch, MD ?REFERRING PROVIDER: Jovita Kussmaul, MD ? ? PT End of Session - 10/05/21 1359   ? ? Visit Number 1   # unchanged due to screen only  ? PT Start Time 9735   ? PT Stop Time 1400   ? PT Time Calculation (min) 3 min   ? Activity Tolerance Patient tolerated treatment well   ? Behavior During Therapy [facility] for tasks assessed/performed   ? ?  ?  ? ?  ? ? ?Past Medical History:  ?Diagnosis Date  ? Hx of adenomatous colonic polyps 05/18/2018  ? Hypertension   ? IBS (irritable bowel syndrome)   ? PONV (postoperative nausea and vomiting)   ? Varicose veins   ? ?Past Surgical History:  ?Procedure Laterality Date  ? BREAST BIOPSY Right 2017 and 2006  ? x 2  ? BREAST EXCISIONAL BIOPSY    ? BREAST LUMPECTOMY WITH RADIOACTIVE SEED AND SENTINEL LYMPH NODE BIOPSY Right 04/16/2020  ? Procedure: BREAST LUMPECTOMY WITH RADIOACTIVE SEED AND SENTINEL LYMPH NODE BIOPSY;  Surgeon: Jovita Kussmaul, MD;  Location: [place];  Service: General;  Laterality: Right;  ? BREAST LUMPECTOMY WITH RADIOACTIVE SEED LOCALIZATION Right 02/13/2016  ? Procedure: RIGHT BREAST LUMPECTOMY WITH RADIOACTIVE SEED LOCALIZATION;  Surgeon: Autumn Messing III, MD;  Location: [facility];  Service: General;  Laterality: Right;  ? COLONOSCOPY    ? NASAL SEPTUM SURGERY  1990  ? UPPER GASTROINTESTINAL ENDOSCOPY    ? WISDOM TOOTH EXTRACTION    ? ?Patient Active Problem List  ? Diagnosis Date Noted  ? HX: breast cancer 03/02/2020  ? Routine general medical examination at a health care facility 01/20/2020  ? Hx of adenomatous colonic polyps 05/18/2018  ? Varicose vein of leg 03/15/2012  ? Anxiety 01/04/2012  ? Essential hypertension   ? ? ?REFERRING DIAG: right breast cancer at risk for lymphedema ? ?THERAPY DIAG:  ?Aftercare following surgery for  neoplasm ? ?PERTINENT HISTORY: Rt lumpectomy with SLNB on 04/16/20, currently undergoing radiation  ? ?PRECAUTIONS: right UE Lymphedema risk, None ? ?SUBJECTIVE: Pt returns for her 3 month L-Dex screen.  ? ?PAIN:  ?Are you having pain? No ? ?SOZO SCREENING: ?Patient was assessed today using the SOZO machine to determine the lymphedema index score. This was compared to her baseline score. It was determined that she is within the recommended range when compared to her baseline and no further action is needed at this time. She will continue SOZO screenings. These are done every 3 months for 2 years post operatively followed by every 6 months for 2 years, and then annually. ? ? ? ?Otelia Limes, PTA ?10/05/2021, 2:00 PM ? ?  ? ?

## 2021-12-06 ENCOUNTER — Other Ambulatory Visit: Payer: Self-pay | Admitting: Internal Medicine

## 2021-12-06 ENCOUNTER — Other Ambulatory Visit: Payer: Self-pay | Admitting: Hematology

## 2021-12-07 ENCOUNTER — Other Ambulatory Visit: Payer: Self-pay | Admitting: Hematology

## 2021-12-07 MED ORDER — CLONAZEPAM 0.5 MG PO TABS: ORAL_TABLET | ORAL | 0 refills | Status: DC

## 2021-12-07 MED ORDER — DICYCLOMINE HCL 20 MG PO TABS: 20.0000 mg | ORAL_TABLET | ORAL | 0 refills | Status: DC | PRN

## 2022-02-08 ENCOUNTER — Ambulatory Visit: Payer: BC Managed Care – PPO | Attending: General Surgery

## 2022-02-08 VITALS — Wt 131.4 lb

## 2022-02-08 DIAGNOSIS — Z483 Aftercare following surgery for neoplasm: Secondary | ICD-10-CM | POA: Insufficient documentation

## 2022-02-08 NOTE — Therapy (Signed)
  OUTPATIENT PHYSICAL THERAPY SOZO SCREENING NOTE   Patient Name: Kelsey Simon MRN: [id number] DOB:[birth year], 66 y.o., female Today's Date: 02/08/2022  PCP: Hoyt Koch, MD REFERRING PROVIDER: Autumn Messing III, MD   PT End of Session - 02/08/22 1604     Visit Number 1   # unchanged due to screen only   PT Start Time 1603    PT Stop Time 1607    PT Time Calculation (min) 4 min    Activity Tolerance Patient tolerated treatment well    Behavior During Therapy [facility] for tasks assessed/performed             Past Medical History:  Diagnosis Date   Hx of adenomatous colonic polyps 05/18/2018   Hypertension    IBS (irritable bowel syndrome)    PONV (postoperative nausea and vomiting)    Varicose veins    Past Surgical History:  Procedure Laterality Date   BREAST BIOPSY Right 2017 and 2006   x 2   BREAST EXCISIONAL BIOPSY     BREAST LUMPECTOMY WITH RADIOACTIVE SEED AND SENTINEL LYMPH NODE BIOPSY Right 04/16/2020   Procedure: BREAST LUMPECTOMY WITH RADIOACTIVE SEED AND SENTINEL LYMPH NODE BIOPSY;  Surgeon: Jovita Kussmaul, MD;  Location: [place];  Service: General;  Laterality: Right;   BREAST LUMPECTOMY WITH RADIOACTIVE SEED LOCALIZATION Right 02/13/2016   Procedure: RIGHT BREAST LUMPECTOMY WITH RADIOACTIVE SEED LOCALIZATION;  Surgeon: Autumn Messing III, MD;  Location: [place];  Service: General;  Laterality: Right;   COLONOSCOPY     NASAL [place] EXTRACTION     Patient Active Problem List   Diagnosis Date Noted   HX: breast cancer 03/02/2020   Routine general medical examination at a health care facility 01/20/2020   Hx of adenomatous colonic polyps 05/18/2018   Varicose vein of leg 03/15/2012   Anxiety 01/04/2012   Essential hypertension     REFERRING DIAG: right breast cancer at risk for lymphedema  THERAPY DIAG: Aftercare following surgery for  neoplasm  PERTINENT HISTORY: Rt lumpectomy with SLNB on 04/16/20, currently undergoing radiation   PRECAUTIONS: right UE Lymphedema risk, None  SUBJECTIVE: Pt returns for her 3 month L-Dex screen.   PAIN:  Are you having pain? No  SOZO SCREENING: Patient was assessed today using the SOZO machine to determine the lymphedema index score. This was compared to her baseline score. It was determined that she is within the recommended range when compared to her baseline and no further action is needed at this time. She will continue SOZO screenings. These are done every 3 months for 2 years post operatively followed by every 6 months for 2 years, and then annually.   L-DEX FLOWSHEETS - 02/08/22 1600       L-DEX LYMPHEDEMA SCREENING   Measurement Type Unilateral    L-DEX MEASUREMENT EXTREMITY Upper Extremity    POSITION  Standing    DOMINANT SIDE Right    At Risk Side Right    BASELINE SCORE (UNILATERAL) 0.8    L-DEX SCORE (UNILATERAL) 4.3    VALUE CHANGE (UNILAT) 3.5              Otelia Limes, PTA 02/08/2022, 4:06 PM

## 2022-02-13 DIAGNOSIS — Z23 Encounter for immunization: Secondary | ICD-10-CM | POA: Diagnosis not present

## 2022-02-25 ENCOUNTER — Encounter: Payer: Self-pay | Admitting: Nurse Practitioner

## 2022-02-25 ENCOUNTER — Inpatient Hospital Stay: Payer: BC Managed Care – PPO | Attending: Nurse Practitioner | Admitting: Nurse Practitioner

## 2022-02-25 ENCOUNTER — Inpatient Hospital Stay: Payer: BC Managed Care – PPO

## 2022-02-25 ENCOUNTER — Other Ambulatory Visit: Payer: Self-pay

## 2022-02-25 VITALS — BP 166/93 | HR 75 | Temp 97.7°F | Resp 15 | Wt 132.4 lb

## 2022-02-25 DIAGNOSIS — Z923 Personal history of irradiation: Secondary | ICD-10-CM | POA: Insufficient documentation

## 2022-02-25 DIAGNOSIS — Z170 Estrogen receptor positive status [ER+]: Secondary | ICD-10-CM

## 2022-02-25 DIAGNOSIS — Z79899 Other long term (current) drug therapy: Secondary | ICD-10-CM | POA: Insufficient documentation

## 2022-02-25 DIAGNOSIS — Z853 Personal history of malignant neoplasm of breast: Secondary | ICD-10-CM | POA: Diagnosis not present

## 2022-02-25 DIAGNOSIS — C50211 Malignant neoplasm of upper-inner quadrant of right female breast: Secondary | ICD-10-CM | POA: Diagnosis not present

## 2022-02-25 LAB — CBC WITH DIFFERENTIAL (CANCER CENTER ONLY)
Abs Immature Granulocytes: 0.01 10*3/uL (ref 0.00–0.07)
Basophils Absolute: 0 10*3/uL (ref 0.0–0.1)
Basophils Relative: 1 %
Eosinophils Absolute: 0.1 10*3/uL (ref 0.0–0.5)
Eosinophils Relative: 2 %
HCT: 40.6 % (ref 36.0–46.0)
Hemoglobin: 13.7 g/dL (ref 12.0–15.0)
Immature Granulocytes: 0 %
Lymphocytes Relative: 21 %
Lymphs Abs: 0.9 10*3/uL (ref 0.7–4.0)
MCH: 28.1 pg (ref 26.0–34.0)
MCHC: 33.7 g/dL (ref 30.0–36.0)
MCV: 83.2 fL (ref 80.0–100.0)
Monocytes Absolute: 0.4 10*3/uL (ref 0.1–1.0)
Monocytes Relative: 10 %
Neutro Abs: 2.8 10*3/uL (ref 1.7–7.7)
Neutrophils Relative %: 66 %
Platelet Count: 273 10*3/uL (ref 150–400)
RBC: 4.88 MIL/uL (ref 3.87–5.11)
RDW: 12.2 % (ref 11.5–15.5)
WBC Count: 4.2 10*3/uL (ref 4.0–10.5)
nRBC: 0 % (ref 0.0–0.2)

## 2022-02-25 LAB — CMP (CANCER CENTER ONLY)
ALT: 18 U/L (ref 0–44)
AST: 20 U/L (ref 15–41)
Albumin: 4.1 g/dL (ref 3.5–5.0)
Alkaline Phosphatase: 40 U/L (ref 38–126)
Anion gap: 7 (ref 5–15)
BUN: 10 mg/dL (ref 8–23)
CO2: 31 mmol/L (ref 22–32)
Calcium: 9.1 mg/dL (ref 8.9–10.3)
Chloride: 98 mmol/L (ref 98–111)
Creatinine: 0.69 mg/dL (ref 0.44–1.00)
GFR, Estimated: >60 mL/min (ref >60)
Glucose, Bld: 92 mg/dL (ref 70–99)
Potassium: 4.3 mmol/L (ref 3.5–5.1)
Sodium: 136 mmol/L (ref 135–145)
Total Bilirubin: 0.5 mg/dL (ref 0.3–1.2)
Total Protein: 6.6 g/dL (ref 6.5–8.1)

## 2022-02-25 NOTE — Progress Notes (Signed)
Kelsey Simon   [phone] [phone]   Clinic Follow up Note   Simon Care Team: Hoyt Koch, MD as PCP - General (Internal Medicine) Truitt Merle, MD as Consulting Physician (Hematology) Jovita Kussmaul, MD as Consulting Physician (General Surgery) Rockwell Germany, RN as Oncology Nurse Navigator Mauro Kaufmann, RN as Oncology Nurse Navigator Noreene Filbert, MD as Radiation Oncologist (Radiation Oncology) Alla Feeling, NP as Nurse Practitioner (Nurse Practitioner) 02/25/2022  CHIEF COMPLAINT: Follow-up right breast cancer  SUMMARY OF ONCOLOGIC HISTORY: Oncology History Overview Note  Cancer Staging Malignant neoplasm of upper-inner quadrant of right breast in female, estrogen receptor positive ([place]) Staging form: Breast, AJCC 8th Edition - Clinical stage from 02/18/2020: Stage IA (cT1b, cN0, cM0, G1, ER+, PR+, HER2-) - Signed by Truitt Merle, MD on 03/02/2020    HX: breast cancer  02/05/2020 Mammogram   Mammogram 02/05/20  IMPRESSION: Right breast mass at 2 o'clock measuring 0.5 x 0.6 x 0.6 cm and 5cmfn is indeterminate.   02/18/2020 Cancer Staging   Staging form: Breast, AJCC 8th Edition - Clinical stage from 02/18/2020: Stage IA (cT1b, cN0, cM0, G1, ER+, PR+, HER2-) - Signed by Truitt Merle, MD on 03/02/2020   02/18/2020 Initial Biopsy   Diagnosis 02/18/20  Breast, right, needle core biopsy, 2 o'clock - INVASIVE DUCTAL CARCINOMA, GRADE 1. - SEE MICROSCOPIC DESCRIPTION Microscopic Comment The greatest tumor dimension is 0.7 cm. A breast prognostic profile will be performed. Immunohistochemistry of basal cell markers (Calponin, p63 and smooth muscle myosin) supports the diagnosis.   02/18/2020 Receptors her2   PROGNOSTIC INDICATORS Results: IMMUNOHISTOCHEMICAL AND MORPHOMETRIC ANALYSIS PERFORMED MANUALLY The tumor cells are NEGATIVE for Her2 (1+). Estrogen Receptor: 95%, POSITIVE, STRONG STAINING INTENSITY Progesterone Receptor: 95%,  POSITIVE, STRONG STAINING INTENSITY Proliferation Marker Ki67: 5%   03/02/2020 Initial Diagnosis   Malignant neoplasm of upper-inner quadrant of right breast in female, estrogen receptor positive ([place])   04/16/2020 Surgery   BREAST LUMPECTOMY WITH RADIOACTIVE SEED AND SENTINEL LYMPH NODE BIOPSY by Dr Marlou Starks    04/16/2020 Pathology Results   FINAL MICROSCOPIC DIAGNOSIS:   A. LYMPH NODE RIGHT AXILLARY #1, SENTINEL, EXCISION:  - Lymph node, negative for carcinoma (0/1)   B. BREAST, RIGHT, LUMPECTOMY:  - Invasive ductal carcinoma, 0.6 cm, grade 1, with calcifications  - Resection margins are negative for carcinoma; closest are the inferior  margin at 1.5 mm and superior margin at 4 mm  - Negative for lymphovascular or perineural invasion  - See oncology table   04/16/2020 Cancer Staging   Staging form: Breast, AJCC 8th Edition - Pathologic stage from 04/16/2020: Stage IA (pT1b, pN0, cM0, G1, ER+, PR+, HER2-) - Signed by Alla Feeling, NP on 12/16/2020 Histologic grading system: 3 grade system   05/01/2020 Imaging   DEXA ASSESSMENT: The BMD measured at Femur Total Left is 0.889 g/cm2 with a T-score of -0.9. Kelsey Simon is considered normal according to [facility]) criteria. The scan quality is good.   05/13/2020 - 06/13/2020 Radiation Therapy   Adjuvant Radiation with Dr Berton Mount in [facility]    05/2020 -  Anti-estrogen oral therapy   She declined antiestrogen therapy.    01/20/2021 Survivorship   SCP delivered virtually by Cira Rue, NP     CURRENT THERAPY: Surveillance/high risk screening with annual mammogram and breast MRI staggered 6 months apart  INTERVAL HISTORY: Kelsey Simon returns for follow-up as scheduled, last seen by Dr. Burr Medico 08/14/2021.  She is doing well  without significant changes in her overall health.  Blood pressures range 130/70s at home.  Recently completed the shingles vaccine series.  She got a new kitten and threw her back  out reaching for it in a closet, also dealing with some sciatica which is dissipating.  She denies bone or joint pain, breast concerns such as new lump/mass, nipple discharge or inversion, skin change, recent infection, or any other new specific complaints.  All other systems were reviewed with the Simon and are negative.  MEDICAL HISTORY:  Past Medical History:  Diagnosis Date   Hx of adenomatous colonic polyps 05/18/2018   Hypertension    IBS (irritable bowel syndrome)    PONV (postoperative nausea and vomiting)    Varicose veins     SURGICAL HISTORY: Past Surgical History:  Procedure Laterality Date   BREAST BIOPSY Right 2017 and 2006   x 2   BREAST EXCISIONAL BIOPSY     BREAST LUMPECTOMY WITH RADIOACTIVE SEED AND SENTINEL LYMPH NODE BIOPSY Right 04/16/2020   Procedure: BREAST LUMPECTOMY WITH RADIOACTIVE SEED AND SENTINEL LYMPH NODE BIOPSY;  Surgeon: Jovita Kussmaul, MD;  Location: [facility];  Service: General;  Laterality: Right;   BREAST LUMPECTOMY WITH RADIOACTIVE SEED LOCALIZATION Right 02/13/2016   Procedure: RIGHT BREAST LUMPECTOMY WITH RADIOACTIVE SEED LOCALIZATION;  Surgeon: Autumn Messing III, MD;  Location: [place];  Service: General;  Laterality: Right;   COLONOSCOPY     NASAL SEPTUM SURGERY  1990   UPPER GASTROINTESTINAL ENDOSCOPY     WISDOM TOOTH EXTRACTION      I have reviewed the social history and family history with the Simon and they are unchanged from previous note.  ALLERGIES:  is allergic to penicillins.  MEDICATIONS:  Current Outpatient Medications  Medication Sig Dispense Refill   b complex vitamins capsule Take 1 capsule by mouth daily.     BIOTIN PO Take by mouth daily.     cholecalciferol (VITAMIN D3) 25 MCG (1000 UT) tablet Take 1,000 Units by mouth daily.     clonazePAM (KLONOPIN) 0.5 MG tablet TAKE ONE-HALF TABLET BY MOUTH AS NEEDED FOR SLEEP 30 tablet 0   dicyclomine (BENTYL) 20 MG tablet Take 1 tablet (20 mg  total) by mouth as needed. 90 tablet 0   Magnesium 250 MG TABS Take by mouth.     triamterene-hydrochlorothiazide (MAXZIDE-25) 37.5-25 MG tablet TAKE 1 TABLET BY MOUTH ONCE A DAY 90 tablet 3   No current facility-administered medications for Kelsey visit.    PHYSICAL EXAMINATION: ECOG PERFORMANCE STATUS: 0 - Asymptomatic  Vitals:   02/25/22 0843  BP: (!) 166/93  Pulse: 75  Resp: 15  Temp: 97.7 F (36.5 C)  SpO2: 100%   Filed Weights   02/25/22 0843  Weight: 132 lb 6.4 oz (60.1 kg)    GENERAL:alert, no distress and comfortable SKIN: No rash EYES: sclera clear NECK: Without mass LYMPH:  no cervical or supraclavicular palpable lymphadenopathy  LUNGS: normal breathing effort HEART: no lower extremity edema ABDOMEN:abdomen soft, non-tender and normal bowel sounds Musculoskeletal: No focal spinal tenderness NEURO: alert & oriented x 3 with fluent speech, no focal motor/sensory deficits Breast exam: Breasts are symmetrical without nipple discharge or inversion.  S/p right lumpectomy, incisions completely healed.  No palpable mass or nodularity in either breast or axilla that I could appreciate.  LABORATORY DATA:  I have reviewed the data as listed    Latest Ref Rng & Units 02/25/2022    8:31 AM 08/14/2021  12:57 PM 05/28/2020    3:32 PM  CBC  WBC 4.0 - 10.5 K/uL 4.2  4.6  6.1   Hemoglobin 12.0 - 15.0 g/dL 13.7  13.3  13.7   Hematocrit 36.0 - 46.0 % 40.6  40.3  40.0   Platelets 150 - 400 K/uL 273  273  276         Latest Ref Rng & Units 02/25/2022    8:31 AM 08/14/2021   12:57 PM 04/14/2020   12:43 PM  CMP  Glucose 70 - 99 mg/dL 92  98  101   BUN 8 - 23 mg/dL '10  15  9   ' Creatinine 0.44 - 1.00 mg/dL 0.69  0.77  0.74   Sodium 135 - 145 mmol/L 136  137  134   Potassium 3.5 - 5.1 mmol/L 4.3  4.0  4.1   Chloride 98 - 111 mmol/L 98  101  98   CO2 22 - 32 mmol/L '31  31  26   ' Calcium 8.9 - 10.3 mg/dL 9.1  9.4  9.3   Total Protein 6.5 - 8.1 g/dL 6.6  6.7    Total  Bilirubin 0.3 - 1.2 mg/dL 0.5  0.5    Alkaline Phos 38 - 126 U/L 40  42    AST 15 - 41 U/L 20  20    ALT 0 - 44 U/L 18  18        RADIOGRAPHIC STUDIES: I have personally reviewed the radiological images as listed and agreed with the findings in the report. No results found.   ASSESSMENT & PLAN: Kelsey Simon is a 66 y.o. post-menopausal female with    1. Malignant neoplasm of upper-inner quadrant of right breast, Stage IA, p(T1bN0M0), ER+/PR+/HER2-, Grade I  -Diagnosed in 01/2020 with grade I invasive ductal carcinoma, ER and PR strongly positive, HER2(-).  -S/p right lumpectomy with SLNB by Dr Marlou Starks on 04/16/20 showed 0.6cm invasive ductal carcinoma was completely resected, clear margins and node negative. Oncotype was not indicated.  -S/p adjuvant Radiation 05/13/20-06/14/19. She tolerated well.  -she declined antiestrogen therapy. She is now on surveillance/high risk screening program with annual mammogram and breast MRI staggered 6 months apart.  -Last mammogram 04/06/2021 and breast MRI 08/07/2021 were both negative/benign  -Kelsey Simon is clinically doing well.  Breast exam is benign, labs are normal.  There is no clinical concern for breast cancer recurrence. -She is approaching 2 years from definitive treatment, continue breast cancer surveillance -Next mammogram 03/2022 and breast MRI 09/2022 -Encouraged her to continue healthy active lifestyle and age-appropriate health maintenance -Follow-up in 6 months, or sooner if needed   2. Comorbidities: HTN, IBS, Anxiety  -Controlled on current med regimen  3. Bone Health -Her baseline DEXA from 05/01/20 was normal with lowest T-score -0.9 at left total femur.  -Continue calcium, vitamin D, and weightbearing exercise   Plan: -Labs reviewed -Continue breast cancer surveillance/high risk screening program -Next mammogram due 03/2022 and screening breast MRI 09/2022 -Follow-up in 6 months, or sooner if needed   Orders Placed Kelsey  Encounter  Procedures   MM DIAG BREAST TOMO BILATERAL    Standing Status:   Future    Standing Expiration Date:   02/26/2023    Order Specific Question:   Reason for Exam (SYMPTOM  OR DIAGNOSIS REQUIRED)    Answer:   R breast cancer 03/2020    Order Specific Question:   Preferred imaging location?    Answer:   [facility]  All questions were answered. The Simon knows to call the clinic with any problems, questions or concerns. No barriers to learning were detected.     Alla Feeling, NP 02/25/22

## 2022-03-05 ENCOUNTER — Telehealth: Payer: Self-pay | Admitting: Hematology

## 2022-03-05 NOTE — Telephone Encounter (Signed)
Left message with follow-up appointment per 9/28 los.

## 2022-03-10 DIAGNOSIS — M9903 Segmental and somatic dysfunction of lumbar region: Secondary | ICD-10-CM | POA: Diagnosis not present

## 2022-03-10 DIAGNOSIS — M9901 Segmental and somatic dysfunction of cervical region: Secondary | ICD-10-CM | POA: Diagnosis not present

## 2022-03-10 DIAGNOSIS — M9902 Segmental and somatic dysfunction of thoracic region: Secondary | ICD-10-CM | POA: Diagnosis not present

## 2022-03-10 DIAGNOSIS — M542 Cervicalgia: Secondary | ICD-10-CM | POA: Diagnosis not present

## 2022-03-11 DIAGNOSIS — M542 Cervicalgia: Secondary | ICD-10-CM | POA: Diagnosis not present

## 2022-03-11 DIAGNOSIS — M9901 Segmental and somatic dysfunction of cervical region: Secondary | ICD-10-CM | POA: Diagnosis not present

## 2022-03-11 DIAGNOSIS — M9902 Segmental and somatic dysfunction of thoracic region: Secondary | ICD-10-CM | POA: Diagnosis not present

## 2022-03-11 DIAGNOSIS — M9903 Segmental and somatic dysfunction of lumbar region: Secondary | ICD-10-CM | POA: Diagnosis not present

## 2022-03-12 DIAGNOSIS — M9901 Segmental and somatic dysfunction of cervical region: Secondary | ICD-10-CM | POA: Diagnosis not present

## 2022-03-12 DIAGNOSIS — M9903 Segmental and somatic dysfunction of lumbar region: Secondary | ICD-10-CM | POA: Diagnosis not present

## 2022-03-12 DIAGNOSIS — M9902 Segmental and somatic dysfunction of thoracic region: Secondary | ICD-10-CM | POA: Diagnosis not present

## 2022-03-12 DIAGNOSIS — M542 Cervicalgia: Secondary | ICD-10-CM | POA: Diagnosis not present

## 2022-03-15 DIAGNOSIS — M542 Cervicalgia: Secondary | ICD-10-CM | POA: Diagnosis not present

## 2022-03-15 DIAGNOSIS — M9903 Segmental and somatic dysfunction of lumbar region: Secondary | ICD-10-CM | POA: Diagnosis not present

## 2022-03-15 DIAGNOSIS — M9901 Segmental and somatic dysfunction of cervical region: Secondary | ICD-10-CM | POA: Diagnosis not present

## 2022-03-15 DIAGNOSIS — M9902 Segmental and somatic dysfunction of thoracic region: Secondary | ICD-10-CM | POA: Diagnosis not present

## 2022-03-16 DIAGNOSIS — M9902 Segmental and somatic dysfunction of thoracic region: Secondary | ICD-10-CM | POA: Diagnosis not present

## 2022-03-16 DIAGNOSIS — M542 Cervicalgia: Secondary | ICD-10-CM | POA: Diagnosis not present

## 2022-03-16 DIAGNOSIS — M9901 Segmental and somatic dysfunction of cervical region: Secondary | ICD-10-CM | POA: Diagnosis not present

## 2022-03-16 DIAGNOSIS — M9903 Segmental and somatic dysfunction of lumbar region: Secondary | ICD-10-CM | POA: Diagnosis not present

## 2022-03-19 DIAGNOSIS — M9902 Segmental and somatic dysfunction of thoracic region: Secondary | ICD-10-CM | POA: Diagnosis not present

## 2022-03-19 DIAGNOSIS — M9901 Segmental and somatic dysfunction of cervical region: Secondary | ICD-10-CM | POA: Diagnosis not present

## 2022-03-19 DIAGNOSIS — M542 Cervicalgia: Secondary | ICD-10-CM | POA: Diagnosis not present

## 2022-03-19 DIAGNOSIS — M9903 Segmental and somatic dysfunction of lumbar region: Secondary | ICD-10-CM | POA: Diagnosis not present

## 2022-03-22 DIAGNOSIS — M9903 Segmental and somatic dysfunction of lumbar region: Secondary | ICD-10-CM | POA: Diagnosis not present

## 2022-03-22 DIAGNOSIS — M542 Cervicalgia: Secondary | ICD-10-CM | POA: Diagnosis not present

## 2022-03-22 DIAGNOSIS — M9902 Segmental and somatic dysfunction of thoracic region: Secondary | ICD-10-CM | POA: Diagnosis not present

## 2022-03-22 DIAGNOSIS — M9901 Segmental and somatic dysfunction of cervical region: Secondary | ICD-10-CM | POA: Diagnosis not present

## 2022-03-24 DIAGNOSIS — M9901 Segmental and somatic dysfunction of cervical region: Secondary | ICD-10-CM | POA: Diagnosis not present

## 2022-03-24 DIAGNOSIS — M9903 Segmental and somatic dysfunction of lumbar region: Secondary | ICD-10-CM | POA: Diagnosis not present

## 2022-03-24 DIAGNOSIS — M9902 Segmental and somatic dysfunction of thoracic region: Secondary | ICD-10-CM | POA: Diagnosis not present

## 2022-03-24 DIAGNOSIS — M542 Cervicalgia: Secondary | ICD-10-CM | POA: Diagnosis not present

## 2022-03-26 DIAGNOSIS — M9902 Segmental and somatic dysfunction of thoracic region: Secondary | ICD-10-CM | POA: Diagnosis not present

## 2022-03-26 DIAGNOSIS — M542 Cervicalgia: Secondary | ICD-10-CM | POA: Diagnosis not present

## 2022-03-26 DIAGNOSIS — M9903 Segmental and somatic dysfunction of lumbar region: Secondary | ICD-10-CM | POA: Diagnosis not present

## 2022-03-26 DIAGNOSIS — M9901 Segmental and somatic dysfunction of cervical region: Secondary | ICD-10-CM | POA: Diagnosis not present

## 2022-03-29 DIAGNOSIS — M9903 Segmental and somatic dysfunction of lumbar region: Secondary | ICD-10-CM | POA: Diagnosis not present

## 2022-03-29 DIAGNOSIS — M542 Cervicalgia: Secondary | ICD-10-CM | POA: Diagnosis not present

## 2022-03-29 DIAGNOSIS — M9902 Segmental and somatic dysfunction of thoracic region: Secondary | ICD-10-CM | POA: Diagnosis not present

## 2022-03-29 DIAGNOSIS — M9901 Segmental and somatic dysfunction of cervical region: Secondary | ICD-10-CM | POA: Diagnosis not present

## 2022-03-31 DIAGNOSIS — M9902 Segmental and somatic dysfunction of thoracic region: Secondary | ICD-10-CM | POA: Diagnosis not present

## 2022-03-31 DIAGNOSIS — M542 Cervicalgia: Secondary | ICD-10-CM | POA: Diagnosis not present

## 2022-03-31 DIAGNOSIS — M9903 Segmental and somatic dysfunction of lumbar region: Secondary | ICD-10-CM | POA: Diagnosis not present

## 2022-03-31 DIAGNOSIS — M9901 Segmental and somatic dysfunction of cervical region: Secondary | ICD-10-CM | POA: Diagnosis not present

## 2022-04-02 DIAGNOSIS — M9902 Segmental and somatic dysfunction of thoracic region: Secondary | ICD-10-CM | POA: Diagnosis not present

## 2022-04-02 DIAGNOSIS — M542 Cervicalgia: Secondary | ICD-10-CM | POA: Diagnosis not present

## 2022-04-02 DIAGNOSIS — M9903 Segmental and somatic dysfunction of lumbar region: Secondary | ICD-10-CM | POA: Diagnosis not present

## 2022-04-02 DIAGNOSIS — M9901 Segmental and somatic dysfunction of cervical region: Secondary | ICD-10-CM | POA: Diagnosis not present

## 2022-04-05 DIAGNOSIS — M542 Cervicalgia: Secondary | ICD-10-CM | POA: Diagnosis not present

## 2022-04-05 DIAGNOSIS — M9901 Segmental and somatic dysfunction of cervical region: Secondary | ICD-10-CM | POA: Diagnosis not present

## 2022-04-05 DIAGNOSIS — M9903 Segmental and somatic dysfunction of lumbar region: Secondary | ICD-10-CM | POA: Diagnosis not present

## 2022-04-05 DIAGNOSIS — M9902 Segmental and somatic dysfunction of thoracic region: Secondary | ICD-10-CM | POA: Diagnosis not present

## 2022-04-07 DIAGNOSIS — M9902 Segmental and somatic dysfunction of thoracic region: Secondary | ICD-10-CM | POA: Diagnosis not present

## 2022-04-07 DIAGNOSIS — M9903 Segmental and somatic dysfunction of lumbar region: Secondary | ICD-10-CM | POA: Diagnosis not present

## 2022-04-07 DIAGNOSIS — M9901 Segmental and somatic dysfunction of cervical region: Secondary | ICD-10-CM | POA: Diagnosis not present

## 2022-04-07 DIAGNOSIS — M542 Cervicalgia: Secondary | ICD-10-CM | POA: Diagnosis not present

## 2022-04-09 DIAGNOSIS — M542 Cervicalgia: Secondary | ICD-10-CM | POA: Diagnosis not present

## 2022-04-09 DIAGNOSIS — M9903 Segmental and somatic dysfunction of lumbar region: Secondary | ICD-10-CM | POA: Diagnosis not present

## 2022-04-09 DIAGNOSIS — M9901 Segmental and somatic dysfunction of cervical region: Secondary | ICD-10-CM | POA: Diagnosis not present

## 2022-04-09 DIAGNOSIS — M9902 Segmental and somatic dysfunction of thoracic region: Secondary | ICD-10-CM | POA: Diagnosis not present

## 2022-04-12 DIAGNOSIS — M9902 Segmental and somatic dysfunction of thoracic region: Secondary | ICD-10-CM | POA: Diagnosis not present

## 2022-04-12 DIAGNOSIS — M9903 Segmental and somatic dysfunction of lumbar region: Secondary | ICD-10-CM | POA: Diagnosis not present

## 2022-04-12 DIAGNOSIS — M9901 Segmental and somatic dysfunction of cervical region: Secondary | ICD-10-CM | POA: Diagnosis not present

## 2022-04-12 DIAGNOSIS — M542 Cervicalgia: Secondary | ICD-10-CM | POA: Diagnosis not present

## 2022-04-14 DIAGNOSIS — M9902 Segmental and somatic dysfunction of thoracic region: Secondary | ICD-10-CM | POA: Diagnosis not present

## 2022-04-14 DIAGNOSIS — M9901 Segmental and somatic dysfunction of cervical region: Secondary | ICD-10-CM | POA: Diagnosis not present

## 2022-04-14 DIAGNOSIS — M9903 Segmental and somatic dysfunction of lumbar region: Secondary | ICD-10-CM | POA: Diagnosis not present

## 2022-04-14 DIAGNOSIS — M542 Cervicalgia: Secondary | ICD-10-CM | POA: Diagnosis not present

## 2022-04-16 ENCOUNTER — Ambulatory Visit
Admission: RE | Admit: 2022-04-16 | Discharge: 2022-04-16 | Disposition: A | Discharge status: Home / Self Care | Payer: BC Managed Care – PPO | Source: Ambulatory Visit | Attending: Nurse Practitioner | Admitting: Nurse Practitioner

## 2022-04-16 DIAGNOSIS — Z853 Personal history of malignant neoplasm of breast: Secondary | ICD-10-CM | POA: Diagnosis not present

## 2022-04-16 DIAGNOSIS — R92333 Mammographic heterogeneous density, bilateral breasts: Secondary | ICD-10-CM | POA: Diagnosis not present

## 2022-04-16 DIAGNOSIS — C50211 Malignant neoplasm of upper-inner quadrant of right female breast: Secondary | ICD-10-CM

## 2022-06-29 ENCOUNTER — Ambulatory Visit: Payer: BC Managed Care – PPO | Admitting: Internal Medicine

## 2022-06-29 ENCOUNTER — Encounter: Payer: Self-pay | Admitting: Internal Medicine

## 2022-06-29 VITALS — BP 134/82 | HR 78 | Temp 98.2°F | Ht 67.0 in | Wt 129.0 lb

## 2022-06-29 DIAGNOSIS — M7062 Trochanteric bursitis, left hip: Secondary | ICD-10-CM | POA: Diagnosis not present

## 2022-06-29 MED ORDER — PREDNISONE 20 MG PO TABS: 40.0000 mg | ORAL_TABLET | Freq: Every day | ORAL | 0 refills | Status: DC

## 2022-06-29 NOTE — Patient Instructions (Signed)
We have sent in the prednisone to take 2 pills daily for 5 days.

## 2022-06-29 NOTE — Progress Notes (Signed)
   Subjective:   Patient ID: Kelsey Simon, female    DOB: 22-May-1956, 67 y.o.   MRN: [id number]  HPI The patient is a 67 YO female coming in for pain left thigh and down left leg started October. Taking ibuprofen 600 mg daily which is helping sometimes. Chriopractor took x-rays and has helped slightly   Review of Systems  Constitutional: Negative.   HENT: Negative.    Eyes: Negative.   Respiratory:  Negative for cough, chest tightness and shortness of breath.   Cardiovascular:  Negative for chest pain, palpitations and leg swelling.  Gastrointestinal:  Negative for abdominal distention, abdominal pain, constipation, diarrhea, nausea and vomiting.  Musculoskeletal:  Positive for arthralgias and myalgias.  Skin: Negative.   Neurological: Negative.   Psychiatric/Behavioral: Negative.      Objective:  Physical Exam Constitutional:      Appearance: She is well-developed.  HENT:     Head: Normocephalic and atraumatic.  Cardiovascular:     Rate and Rhythm: Normal rate and regular rhythm.  Pulmonary:     Effort: Pulmonary effort is normal. No respiratory distress.     Breath sounds: Normal breath sounds. No wheezing or rales.  Abdominal:     General: Bowel sounds are normal. There is no distension.     Palpations: Abdomen is soft.     Tenderness: There is no abdominal tenderness. There is no rebound.  Musculoskeletal:        General: Tenderness present.     Cervical back: Normal range of motion.     Comments: Tenderness left bursal region with pain radiating down to left knee, no pain left groin area or lumbar spine  Skin:    General: Skin is warm and dry.  Neurological:     Mental Status: She is alert and oriented to person, place, and time.     Coordination: Coordination normal.     Vitals:   06/29/22 1037  BP: 134/82  Pulse: 78  Temp: 98.2 F (36.8 C)  TempSrc: Oral  SpO2: 99%  Weight: 129 lb (58.5 kg)  Height: '5\' 7"'$  (1.702 m)    Assessment & Plan:

## 2022-06-29 NOTE — Assessment & Plan Note (Signed)
Suspected bursitis or tendonitis based on exam. Rx prednisone 5 day course and if pain persists referral to sports medicine for possible [place] for diagnosis and treatment.

## 2022-07-28 ENCOUNTER — Other Ambulatory Visit (HOSPITAL_COMMUNITY): Payer: Self-pay

## 2022-08-08 NOTE — Therapy (Signed)
  OUTPATIENT PHYSICAL THERAPY SOZO SCREENING NOTE   Patient Name: Kelsey Simon MRN: [id number] DOB:04/02/1956, 67 y.o., female Today's Date: 08/09/2022  PCP: Hoyt Koch, MD REFERRING PROVIDER: Jovita Kussmaul, MD   PT End of Session - 08/09/22 1601     Visit Number 1   screen   PT Start Time 1550    PT Stop Time 1554    PT Time Calculation (min) 4 min    Activity Tolerance Patient tolerated treatment well    Behavior During Therapy [facility] for tasks assessed/performed              Past Medical History:  Diagnosis Date   Hx of adenomatous colonic polyps 05/18/2018   Hypertension    IBS (irritable bowel syndrome)    PONV (postoperative nausea and vomiting)    Varicose veins    Past Surgical History:  Procedure Laterality Date   BREAST BIOPSY Right 2017 and 2006   x 2   BREAST EXCISIONAL BIOPSY     BREAST LUMPECTOMY WITH RADIOACTIVE SEED AND SENTINEL LYMPH NODE BIOPSY Right 04/16/2020   Procedure: BREAST LUMPECTOMY WITH RADIOACTIVE SEED AND SENTINEL LYMPH NODE BIOPSY;  Surgeon: Jovita Kussmaul, MD;  Location: [place];  Service: General;  Laterality: Right;   BREAST LUMPECTOMY WITH RADIOACTIVE SEED LOCALIZATION Right 02/13/2016   Procedure: RIGHT BREAST LUMPECTOMY WITH RADIOACTIVE SEED LOCALIZATION;  Surgeon: Autumn Messing III, MD;  Location: [place];  Service: General;  Laterality: Right;   COLONOSCOPY     NASAL [place] EXTRACTION     Patient Active Problem List   Diagnosis Date Noted   Trochanteric bursitis of left hip 06/29/2022   HX: breast cancer 03/02/2020   Routine general medical examination at a health care facility 01/20/2020   Hx of adenomatous colonic polyps 05/18/2018   Varicose vein of leg 03/15/2012   Essential hypertension     REFERRING DIAG: right breast cancer at risk for lymphedema  THERAPY DIAG: Aftercare following surgery for  neoplasm  Malignant neoplasm of upper-inner quadrant of right breast in female, estrogen receptor positive ([place])  PERTINENT HISTORY: Rt lumpectomy with SLNB on 04/16/20, currently undergoing radiation   PRECAUTIONS: right UE Lymphedema risk, None  SUBJECTIVE: Pt returns for her 3 month L-Dex screen.   PAIN:  Are you having pain? No  SOZO SCREENING: Patient was assessed today using the SOZO machine to determine the lymphedema index score. This was compared to her baseline score. It was determined that she is within the recommended range when compared to her baseline and no further action is needed at this time. She will continue SOZO screenings. These are done every 3 months for 2 years post operatively followed by every 6 months for 2 years, and then annually.  Every 3 months until 04/16/22 and then every 6 months until 04/16/24    Stark Bray, PT 08/09/2022, 4:01 PM

## 2022-08-09 ENCOUNTER — Ambulatory Visit: Payer: BC Managed Care – PPO | Attending: General Surgery | Admitting: Rehabilitation

## 2022-08-09 DIAGNOSIS — Z170 Estrogen receptor positive status [ER+]: Secondary | ICD-10-CM | POA: Insufficient documentation

## 2022-08-09 DIAGNOSIS — C50211 Malignant neoplasm of upper-inner quadrant of right female breast: Secondary | ICD-10-CM | POA: Insufficient documentation

## 2022-08-09 DIAGNOSIS — Z483 Aftercare following surgery for neoplasm: Secondary | ICD-10-CM | POA: Insufficient documentation

## 2022-08-24 ENCOUNTER — Other Ambulatory Visit: Payer: Self-pay | Admitting: Internal Medicine

## 2022-08-25 ENCOUNTER — Other Ambulatory Visit: Payer: Self-pay

## 2022-08-25 DIAGNOSIS — Z170 Estrogen receptor positive status [ER+]: Secondary | ICD-10-CM

## 2022-08-25 NOTE — Progress Notes (Unsigned)
Patient Care Team: Hoyt Koch, MD as PCP - General (Internal Medicine) Truitt Merle, MD as Consulting Physician (Hematology) Jovita Kussmaul, MD as Consulting Physician (General Surgery) Rockwell Germany, RN as Oncology Nurse Navigator Mauro Kaufmann, RN as Oncology Nurse Navigator Noreene Filbert, MD as Radiation Oncologist (Radiation Oncology) Alla Feeling, NP as Nurse Practitioner (Nurse Practitioner)   CHIEF COMPLAINT: Follow up right breast cancer   Oncology History Overview Note  Cancer Staging Malignant neoplasm of upper-inner quadrant of right breast in female, estrogen receptor positive [facility]) Staging form: Breast, AJCC 8th Edition - Clinical stage from 02/18/2020: Stage IA (cT1b, cN0, cM0, G1, ER+, PR+, HER2-) - Signed by Truitt Merle, MD on 03/02/2020    HX: breast cancer  02/05/2020 Mammogram   Mammogram 02/05/20  IMPRESSION: Right breast mass at 2 o'clock measuring 0.5 x 0.6 x 0.6 cm and 5cmfn is indeterminate.   02/18/2020 Cancer Staging   Staging form: Breast, AJCC 8th Edition - Clinical stage from 02/18/2020: Stage IA (cT1b, cN0, cM0, G1, ER+, PR+, HER2-) - Signed by Truitt Merle, MD on 03/02/2020   02/18/2020 Initial Biopsy   Diagnosis 02/18/20  Breast, right, needle core biopsy, 2 o'clock - INVASIVE DUCTAL CARCINOMA, GRADE 1. - SEE MICROSCOPIC DESCRIPTION Microscopic Comment The greatest tumor dimension is 0.7 cm. A breast prognostic profile will be performed. Immunohistochemistry of basal cell markers (Calponin, p63 and smooth muscle myosin) supports the diagnosis.   02/18/2020 Receptors her2   PROGNOSTIC INDICATORS Results: IMMUNOHISTOCHEMICAL AND MORPHOMETRIC ANALYSIS PERFORMED MANUALLY The tumor cells are NEGATIVE for Her2 (1+). Estrogen Receptor: 95%, POSITIVE, STRONG STAINING INTENSITY Progesterone Receptor: 95%, POSITIVE, STRONG STAINING INTENSITY Proliferation Marker Ki67: 5%   03/02/2020 Initial Diagnosis   Malignant neoplasm of upper-inner  quadrant of right breast in female, estrogen receptor positive ([place])   04/16/2020 Surgery   BREAST LUMPECTOMY WITH RADIOACTIVE SEED AND SENTINEL LYMPH NODE BIOPSY by Dr Marlou Starks    04/16/2020 Pathology Results   FINAL MICROSCOPIC DIAGNOSIS:   A. LYMPH NODE RIGHT AXILLARY #1, SENTINEL, EXCISION:  - Lymph node, negative for carcinoma (0/1)   B. BREAST, RIGHT, LUMPECTOMY:  - Invasive ductal carcinoma, 0.6 cm, grade 1, with calcifications  - Resection margins are negative for carcinoma; closest are the inferior  margin at 1.5 mm and superior margin at 4 mm  - Negative for lymphovascular or perineural invasion  - See oncology table   04/16/2020 Cancer Staging   Staging form: Breast, AJCC 8th Edition - Pathologic stage from 04/16/2020: Stage IA (pT1b, pN0, cM0, G1, ER+, PR+, HER2-) - Signed by Alla Feeling, NP on 12/16/2020 Histologic grading system: 3 grade system   05/01/2020 Imaging   DEXA ASSESSMENT: The BMD measured at Femur Total Left is 0.889 g/cm2 with a T-score of -0.9. This patient is considered normal according to [facility]) criteria. The scan quality is good.   05/13/2020 - 06/13/2020 Radiation Therapy   Adjuvant Radiation with Dr Berton Mount in [facility]    05/2020 -  Anti-estrogen oral therapy   She declined antiestrogen therapy.    01/20/2021 Survivorship   SCP delivered virtually by Cira Rue, NP      CURRENT THERAPY: Surveillance/high risk screening with annual mammogram and breast MRI staggered 6 months apart   INTERVAL HISTORY [name] returns for follow up as scheduled, last seen by me 02/25/22. Mammo 04/16/22 was benign.   ROS   Past Medical History:  Diagnosis Date   Hx of adenomatous colonic  polyps 05/18/2018   Hypertension    IBS (irritable bowel syndrome)    PONV (postoperative nausea and vomiting)    Varicose veins      Past Surgical History:  Procedure Laterality Date   BREAST BIOPSY Right 2017 and 2006   x 2    BREAST EXCISIONAL BIOPSY     BREAST LUMPECTOMY WITH RADIOACTIVE SEED AND SENTINEL LYMPH NODE BIOPSY Right 04/16/2020   Procedure: BREAST LUMPECTOMY WITH RADIOACTIVE SEED AND SENTINEL LYMPH NODE BIOPSY;  Surgeon: Jovita Kussmaul, MD;  Location: [place];  Service: General;  Laterality: Right;   BREAST LUMPECTOMY WITH RADIOACTIVE SEED LOCALIZATION Right 02/13/2016   Procedure: RIGHT BREAST LUMPECTOMY WITH RADIOACTIVE SEED LOCALIZATION;  Surgeon: Autumn Messing III, MD;  Location: [place];  Service: General;  Laterality: Right;   COLONOSCOPY     NASAL SEPTUM SURGERY  1990   UPPER GASTROINTESTINAL ENDOSCOPY     WISDOM TOOTH EXTRACTION       Outpatient Encounter Medications as of 08/26/2022  Medication Sig   b complex vitamins capsule Take 1 capsule by mouth daily.   BIOTIN PO Take by mouth daily.   cholecalciferol (VITAMIN D3) 25 MCG (1000 UT) tablet Take 1,000 Units by mouth daily.   clonazePAM (KLONOPIN) 0.5 MG tablet TAKE ONE-HALF TABLET BY MOUTH AS NEEDED FOR SLEEP   dicyclomine (BENTYL) 20 MG tablet Take 1 tablet (20 mg total) by mouth as needed.   Magnesium 250 MG TABS Take by mouth.   predniSONE (DELTASONE) 20 MG tablet Take 2 tablets (40 mg total) by mouth daily with breakfast.   triamterene-hydrochlorothiazide (MAXZIDE-25) 37.5-25 MG tablet Take 1 tablet by mouth daily. Annual appt is due must see provider for future refills   No facility-administered encounter medications on file as of 08/26/2022.     There were no vitals filed for this visit. There is no height or weight on file to calculate BMI.   PHYSICAL EXAM GENERAL:alert, no distress and comfortable SKIN: no rash  EYES: sclera clear NECK: without mass LYMPH:  no palpable cervical or supraclavicular lymphadenopathy  LUNGS: clear with normal breathing effort HEART: regular rate & rhythm, no lower extremity edema ABDOMEN: abdomen soft, non-tender and normal bowel sounds NEURO: alert &  oriented x 3 with fluent speech, no focal motor/sensory deficits Breast exam:  PAC without erythema    CBC    Component Value Date/Time   WBC 4.2 02/25/2022 0831   WBC 6.1 05/28/2020 1532   RBC 4.88 02/25/2022 0831   HGB 13.7 02/25/2022 0831   HCT 40.6 02/25/2022 0831   PLT 273 02/25/2022 0831   MCV 83.2 02/25/2022 0831   MCH 28.1 02/25/2022 0831   MCHC 33.7 02/25/2022 0831   RDW 12.2 02/25/2022 0831   LYMPHSABS 0.9 02/25/2022 0831   MONOABS 0.4 02/25/2022 0831   EOSABS 0.1 02/25/2022 0831   BASOSABS 0.0 02/25/2022 0831     CMP     Component Value Date/Time   NA 136 02/25/2022 0831   K 4.3 02/25/2022 0831   CL 98 02/25/2022 0831   CO2 31 02/25/2022 0831   GLUCOSE 92 02/25/2022 0831   BUN 10 02/25/2022 0831   CREATININE 0.69 02/25/2022 0831   CREATININE 0.83 07/27/2013 1552   CALCIUM 9.1 02/25/2022 0831   PROT 6.6 02/25/2022 0831   ALBUMIN 4.1 02/25/2022 0831   AST 20 02/25/2022 0831   ALT 18 02/25/2022 0831   ALKPHOS 40 02/25/2022 0831   BILITOT 0.5 02/25/2022 0831  GFRNONAA >60 02/25/2022 0831   GFRAA >60 02/09/2016 0930     ASSESSMENT & PLAN: Kelsey Simon is a 67 y.o. post-menopausal female with    1. Malignant neoplasm of upper-inner quadrant of right breast, Stage IA, p(T1bN0M0), ER+/PR+/HER2-, Grade I  -Diagnosed in 01/2020 with grade I invasive ductal carcinoma, ER and PR strongly positive, HER2(-).  -S/p right lumpectomy with SLNB by Dr Marlou Starks on 04/16/20 showed 0.6cm invasive ductal carcinoma was completely resected, clear margins and node negative. Oncotype was not indicated.  -S/p adjuvant Radiation 05/13/20-06/14/19. She tolerated well.  -she declined antiestrogen therapy. She is now on surveillance/high risk screening program with annual mammogram and breast MRI staggered 6 months apart.  -Last mammogram 04/06/2022 and breast MRI 08/07/2021 were both negative/benign   2. Co-morbidities: HTN, IBS, Anxiety  -Controlled on current med regimen   3. Bone  Health -Her baseline DEXA from 05/01/20 was normal with lowest T-score -0.9 at left total femur.  -Continue calcium, vitamin D, and weightbearing exercise    PLAN:  No orders of the defined types were placed in this encounter.     All questions were answered. The patient knows to call the clinic with any problems, questions or concerns. No barriers to learning were detected. I spent *** counseling the patient face to face. The total time spent in the appointment was *** and more than 50% was on counseling, review of test results, and coordination of care.   Cira Rue, NP-C @DATE @

## 2022-08-26 ENCOUNTER — Inpatient Hospital Stay: Payer: BC Managed Care – PPO | Attending: Nurse Practitioner | Admitting: Nurse Practitioner

## 2022-08-26 ENCOUNTER — Inpatient Hospital Stay: Payer: BC Managed Care – PPO

## 2022-08-26 ENCOUNTER — Encounter: Payer: Self-pay | Admitting: Nurse Practitioner

## 2022-08-26 VITALS — BP 140/74 | HR 78 | Temp 98.1°F | Resp 18 | Wt 134.3 lb

## 2022-08-26 DIAGNOSIS — F419 Anxiety disorder, unspecified: Secondary | ICD-10-CM | POA: Insufficient documentation

## 2022-08-26 DIAGNOSIS — I10 Essential (primary) hypertension: Secondary | ICD-10-CM | POA: Diagnosis not present

## 2022-08-26 DIAGNOSIS — Z170 Estrogen receptor positive status [ER+]: Secondary | ICD-10-CM

## 2022-08-26 DIAGNOSIS — Z853 Personal history of malignant neoplasm of breast: Secondary | ICD-10-CM | POA: Diagnosis not present

## 2022-08-26 DIAGNOSIS — Z7952 Long term (current) use of systemic steroids: Secondary | ICD-10-CM | POA: Insufficient documentation

## 2022-08-26 DIAGNOSIS — Z79899 Other long term (current) drug therapy: Secondary | ICD-10-CM | POA: Insufficient documentation

## 2022-08-26 DIAGNOSIS — Z923 Personal history of irradiation: Secondary | ICD-10-CM | POA: Diagnosis not present

## 2022-08-26 DIAGNOSIS — C50211 Malignant neoplasm of upper-inner quadrant of right female breast: Secondary | ICD-10-CM

## 2022-08-26 DIAGNOSIS — K589 Irritable bowel syndrome without diarrhea: Secondary | ICD-10-CM | POA: Insufficient documentation

## 2022-08-26 LAB — CMP (CANCER CENTER ONLY)
ALT: 20 U/L (ref 0–44)
AST: 20 U/L (ref 15–41)
Albumin: 4.3 g/dL (ref 3.5–5.0)
Alkaline Phosphatase: 37 U/L — ABNORMAL LOW (ref 38–126)
Anion gap: 7 (ref 5–15)
BUN: 15 mg/dL (ref 8–23)
CO2: 30 mmol/L (ref 22–32)
Calcium: 9.4 mg/dL (ref 8.9–10.3)
Chloride: 100 mmol/L (ref 98–111)
Creatinine: 0.8 mg/dL (ref 0.44–1.00)
GFR, Estimated: >60 mL/min (ref >60)
Glucose, Bld: 114 mg/dL — ABNORMAL HIGH (ref 70–99)
Potassium: 3.9 mmol/L (ref 3.5–5.1)
Sodium: 137 mmol/L (ref 135–145)
Total Bilirubin: 0.6 mg/dL (ref 0.3–1.2)
Total Protein: 6.7 g/dL (ref 6.5–8.1)

## 2022-08-26 LAB — CBC WITH DIFFERENTIAL (CANCER CENTER ONLY)
Abs Immature Granulocytes: 0.01 10*3/uL (ref 0.00–0.07)
Basophils Absolute: 0 10*3/uL (ref 0.0–0.1)
Basophils Relative: 1 %
Eosinophils Absolute: 0.1 10*3/uL (ref 0.0–0.5)
Eosinophils Relative: 2 %
HCT: 43.1 % (ref 36.0–46.0)
Hemoglobin: 14.3 g/dL (ref 12.0–15.0)
Immature Granulocytes: 0 %
Lymphocytes Relative: 18 %
Lymphs Abs: 1 10*3/uL (ref 0.7–4.0)
MCH: 27.9 pg (ref 26.0–34.0)
MCHC: 33.2 g/dL (ref 30.0–36.0)
MCV: 84 fL (ref 80.0–100.0)
Monocytes Absolute: 0.4 10*3/uL (ref 0.1–1.0)
Monocytes Relative: 7 %
Neutro Abs: 3.9 10*3/uL (ref 1.7–7.7)
Neutrophils Relative %: 72 %
Platelet Count: 296 10*3/uL (ref 150–400)
RBC: 5.13 MIL/uL — ABNORMAL HIGH (ref 3.87–5.11)
RDW: 11.8 % (ref 11.5–15.5)
WBC Count: 5.3 10*3/uL (ref 4.0–10.5)
nRBC: 0 % (ref 0.0–0.2)

## 2022-09-10 ENCOUNTER — Ambulatory Visit: Payer: BC Managed Care – PPO | Admitting: Internal Medicine

## 2022-09-10 VITALS — BP 130/74 | HR 76 | Temp 97.8°F | Ht 67.0 in | Wt 133.2 lb

## 2022-09-10 DIAGNOSIS — Z853 Personal history of malignant neoplasm of breast: Secondary | ICD-10-CM | POA: Diagnosis not present

## 2022-09-10 DIAGNOSIS — Z0000 Encounter for general adult medical examination without abnormal findings: Secondary | ICD-10-CM | POA: Diagnosis not present

## 2022-09-10 DIAGNOSIS — I10 Essential (primary) hypertension: Secondary | ICD-10-CM

## 2022-09-10 DIAGNOSIS — M7062 Trochanteric bursitis, left hip: Secondary | ICD-10-CM | POA: Diagnosis not present

## 2022-09-10 MED ORDER — TRIAMTERENE-HCTZ 37.5-25 MG PO TABS: 1.0000 | ORAL_TABLET | Freq: Every day | ORAL | 3 refills | Status: DC

## 2022-09-10 MED ORDER — CLONAZEPAM 0.5 MG PO TABS: ORAL_TABLET | ORAL | 0 refills | Status: DC

## 2022-09-10 NOTE — Progress Notes (Signed)
   Subjective:   Patient ID: Kelsey Simon, female    DOB: 20-Mar-1956, 67 y.o.   MRN: [id number]  Medication Refill Pertinent negatives include no abdominal pain, chest pain, coughing, nausea or vomiting.  Hip Pain    The patient is here for physical.  PMH, FMH, social history reviewed and updated  Review of Systems  Constitutional: Negative.   HENT: Negative.    Eyes: Negative.   Respiratory:  Negative for cough, chest tightness and shortness of breath.   Cardiovascular:  Negative for chest pain, palpitations and leg swelling.  Gastrointestinal:  Negative for abdominal distention, abdominal pain, constipation, diarrhea, nausea and vomiting.  Musculoskeletal: Negative.   Skin: Negative.   Neurological: Negative.   Psychiatric/Behavioral: Negative.      Objective:  Physical Exam Constitutional:      Appearance: She is well-developed.  HENT:     Head: Normocephalic and atraumatic.  Cardiovascular:     Rate and Rhythm: Normal rate and regular rhythm.  Pulmonary:     Effort: Pulmonary effort is normal. No respiratory distress.     Breath sounds: Normal breath sounds. No wheezing or rales.  Abdominal:     General: Bowel sounds are normal. There is no distension.     Palpations: Abdomen is soft.     Tenderness: There is no abdominal tenderness. There is no rebound.  Musculoskeletal:     Cervical back: Normal range of motion.  Skin:    General: Skin is warm and dry.  Neurological:     Mental Status: She is alert and oriented to person, place, and time.     Coordination: Coordination normal.     Vitals:   09/10/22 0939 09/10/22 0941  BP: (!) 150/84 (!) 150/84  Pulse: 76   Temp: 97.8 F (36.6 C)   TempSrc: Oral   SpO2: 99%   Weight: 133 lb 4 oz (60.4 kg)   Height: 5\' 7"  (1.702 m)     Assessment & Plan:

## 2022-09-10 NOTE — Assessment & Plan Note (Signed)
BP mildly elevated initially but recheck normal. Continue hctz 25 mg daily and recent labs normal. Continue.

## 2022-09-10 NOTE — Assessment & Plan Note (Signed)
Flu shot yearly. Pneumonia complete. Shingrix complete. Tetanus up to date. Colonoscopy up to date. Mammogram up to date, pap smear aged out and dexa up to date. Counseled about sun safety and mole surveillance. Counseled about the dangers of distracted driving. Given 10 year screening recommendations.

## 2022-09-10 NOTE — Assessment & Plan Note (Signed)
Getting MRI in May and no signs of recurrence.

## 2022-09-10 NOTE — Assessment & Plan Note (Signed)
90 % resolved would like sports medicine to assess so she can return to her fullest fittest self.

## 2022-09-14 ENCOUNTER — Ambulatory Visit: Payer: BC Managed Care – PPO | Admitting: Family Medicine

## 2022-09-14 ENCOUNTER — Ambulatory Visit (INDEPENDENT_AMBULATORY_CARE_PROVIDER_SITE_OTHER): Payer: BC Managed Care – PPO

## 2022-09-14 ENCOUNTER — Encounter: Payer: Self-pay | Admitting: Family Medicine

## 2022-09-14 VITALS — BP 130/82 | HR 75 | Ht 67.0 in | Wt 134.4 lb

## 2022-09-14 DIAGNOSIS — M7062 Trochanteric bursitis, left hip: Secondary | ICD-10-CM | POA: Diagnosis not present

## 2022-09-14 DIAGNOSIS — M5442 Lumbago with sciatica, left side: Secondary | ICD-10-CM | POA: Diagnosis not present

## 2022-09-14 DIAGNOSIS — G8929 Other chronic pain: Secondary | ICD-10-CM

## 2022-09-14 DIAGNOSIS — M5136 Other intervertebral disc degeneration, lumbar region: Secondary | ICD-10-CM | POA: Diagnosis not present

## 2022-09-14 DIAGNOSIS — G5702 Lesion of sciatic nerve, left lower limb: Secondary | ICD-10-CM

## 2022-09-14 MED ORDER — GABAPENTIN 100 MG PO CAPS: 100.0000 mg | ORAL_CAPSULE | Freq: Every evening | ORAL | 3 refills | Status: DC | PRN

## 2022-09-14 MED ORDER — GABAPENTIN 100 MG PO CAPS: 100.0000 mg | ORAL_CAPSULE | Freq: Three times a day (TID) | ORAL | 3 refills | Status: DC

## 2022-09-14 MED ORDER — PREDNISONE 50 MG PO TABS: ORAL_TABLET | ORAL | 0 refills | Status: DC

## 2022-09-14 NOTE — Progress Notes (Signed)
I, Stevenson Clinch, CMA acting as a Neurosurgeon for Kelsey Graham, MD.  Subjective:    CC: L hip pain  HPI: Pt is a 67 y/o female c/o L hip pain x 7 months, sx started after reaching to get a cat out of the closet, started with low back pain then transitioned into hip pain. Pt locates pain to left-side lower back radiating into the gluteal region, hip, and left leg down to the ankle. Some relief with on oral Prednisone. Sx have improved since onset. Has a long commute to work and spends a lot of time sitting through meetings. Was an avid runner, has transitioned to walking more now. Gets a few hours of relief with IBU.   Pain with driving. Low back pain: yes Radiates: yes Aggravates: sitting > 30 min Treatments tried: Chiro, oral Prednisone, IBU  Had XR done with Chiro ([facility] in [place])  Pertinent review of Systems: No fevers or chills  Relevant historical information: Breast cancer history   Objective:    Vitals:   09/14/22 1351  BP: 130/82  Pulse: 75  SpO2: 98%   General: Well Developed, well nourished, and in no acute distress.   MSK: L-spine normal appearing. Nontender palpation spinal midline. Normal lumbar motion. Lower extremity strength is intact except noted below.  Decreased strength to hip abduction and external rotation with reducing pain Negative slump and straight leg raise test.  Left hip: Normal-appearing Tender palpation at greater trochanter Hip abduction and external rotation strength are diminished 4/5.  Resisted external rotation reproduces pain in buttocks.  Lab and Radiology Results  X-ray images lumbar spine obtained today personally and independently interpreted Mild DDD worse at L3-4 and L5-S1.  No acute fractures are visible. Await formal radiology review    Impression and Recommendations:    Assessment and Plan: 67 y.o. female with left buttocks pain with pain radiating down the left leg and a sciatica type pattern. Pain could  be atypical S1 lumbosacral radiculopathy on the left side plus trochanteric bursitis/hip abductor tendinopathy.  There may also be a component of piriformis syndrome.  Treat for piriformis syndrome and lumbar radiculopathy and trochanteric bursitis.  Plan refer to physical therapy as that should be quite helpful for piriformis syndrome or trochanteric bursitis.  Additionally use a course of prednisone and low-dose gabapentin mostly at bedtime.  Recheck in 1 month.  PDMP not reviewed this encounter. Orders Placed This Encounter  Procedures   DG Lumbar Spine 2-3 Views    Standing Status:   Future    Number of Occurrences:   1    Standing Expiration Date:   10/14/2022    Order Specific Question:   Reason for Exam (SYMPTOM  OR DIAGNOSIS REQUIRED)    Answer:   low back/left posterior hip pain    Order Specific Question:   Preferred imaging location?    Answer:   [facility]   Ambulatory referral to Physical Therapy    Referral Priority:   Routine    Referral Type:   Physical Medicine    Referral Reason:   Specialty Services Required    Requested Specialty:   Physical Therapy    Number of Visits Requested:   1   Meds ordered this encounter  Medications   DISCONTD: gabapentin (NEURONTIN) 100 MG capsule    Sig: Take 1 capsule (100 mg total) by mouth 3 (three) times daily. 100-300mg  tid    Dispense:  30 capsule    Refill:  3  predniSONE (DELTASONE) 50 MG tablet    Sig: Take 1 pill daily for 5 days    Dispense:  5 tablet    Refill:  0   gabapentin (NEURONTIN) 100 MG capsule    Sig: Take 1-3 capsules (100-300 mg total) by mouth at bedtime as needed (nerve pain).    Dispense:  90 capsule    Refill:  3    Discussed warning signs or symptoms. Please see discharge instructions. Patient expresses understanding.   The above documentation has been reviewed and is accurate and complete Kelsey Simon, M.D.

## 2022-09-14 NOTE — Patient Instructions (Addendum)
Thank you for coming in today.   Please get an Xray today before you leave   I've referred you to Physical Therapy.  Let us know if you don't hear from them in one week.   I've sent a prescription for Gabapentin & Prednisone to your pharmacy.   Check back in 6 weeks

## 2022-09-20 ENCOUNTER — Telehealth: Payer: Self-pay | Admitting: Family Medicine

## 2022-09-20 DIAGNOSIS — S32050A Wedge compression fracture of fifth lumbar vertebra, initial encounter for closed fracture: Secondary | ICD-10-CM

## 2022-09-20 DIAGNOSIS — G8929 Other chronic pain: Secondary | ICD-10-CM

## 2022-09-20 NOTE — Telephone Encounter (Signed)
Lumbar spine MRI ordered.

## 2022-09-20 NOTE — Progress Notes (Signed)
Lumbar spine x-ray shows multilevel arthritis and a possible compression fracture.  The read the x-ray looks good compression deformity or compression fracture at L5 could be old.  I went to get an MRI to further evaluate this.  Continue to proceed to physical therapy at this time.

## 2022-09-21 NOTE — Addendum Note (Signed)
Addended by: Evon Slack on: 09/21/2022 01:34 PM   Modules accepted: Orders

## 2022-09-27 DIAGNOSIS — R2681 Unsteadiness on feet: Secondary | ICD-10-CM | POA: Diagnosis not present

## 2022-09-27 DIAGNOSIS — M5416 Radiculopathy, lumbar region: Secondary | ICD-10-CM | POA: Diagnosis not present

## 2022-09-29 DIAGNOSIS — R2681 Unsteadiness on feet: Secondary | ICD-10-CM | POA: Diagnosis not present

## 2022-09-29 DIAGNOSIS — M5416 Radiculopathy, lumbar region: Secondary | ICD-10-CM | POA: Diagnosis not present

## 2022-10-01 ENCOUNTER — Encounter: Payer: Self-pay | Admitting: Nurse Practitioner

## 2022-10-04 DIAGNOSIS — M5416 Radiculopathy, lumbar region: Secondary | ICD-10-CM | POA: Diagnosis not present

## 2022-10-04 DIAGNOSIS — R2681 Unsteadiness on feet: Secondary | ICD-10-CM | POA: Diagnosis not present

## 2022-10-06 ENCOUNTER — Ambulatory Visit
Admission: RE | Admit: 2022-10-06 | Discharge: 2022-10-06 | Disposition: A | Discharge status: Home / Self Care | Payer: BC Managed Care – PPO | Source: Ambulatory Visit | Attending: Family Medicine | Admitting: Family Medicine

## 2022-10-06 DIAGNOSIS — M25552 Pain in left hip: Secondary | ICD-10-CM | POA: Diagnosis not present

## 2022-10-06 DIAGNOSIS — G8929 Other chronic pain: Secondary | ICD-10-CM

## 2022-10-06 DIAGNOSIS — S32050A Wedge compression fracture of fifth lumbar vertebra, initial encounter for closed fracture: Secondary | ICD-10-CM

## 2022-10-06 DIAGNOSIS — M5416 Radiculopathy, lumbar region: Secondary | ICD-10-CM | POA: Diagnosis not present

## 2022-10-11 ENCOUNTER — Telehealth: Payer: Self-pay | Admitting: Family Medicine

## 2022-10-11 DIAGNOSIS — M5416 Radiculopathy, lumbar region: Secondary | ICD-10-CM | POA: Diagnosis not present

## 2022-10-11 DIAGNOSIS — Z780 Asymptomatic menopausal state: Secondary | ICD-10-CM

## 2022-10-11 DIAGNOSIS — R2681 Unsteadiness on feet: Secondary | ICD-10-CM | POA: Diagnosis not present

## 2022-10-11 DIAGNOSIS — G8929 Other chronic pain: Secondary | ICD-10-CM

## 2022-10-11 DIAGNOSIS — S32050A Wedge compression fracture of fifth lumbar vertebra, initial encounter for closed fracture: Secondary | ICD-10-CM

## 2022-10-11 NOTE — Telephone Encounter (Signed)
Called and reviewed the MRI results.  Epidural steroid injection and DEXA scan ordered.

## 2022-10-11 NOTE — Progress Notes (Signed)
As we spoke on the phone today looks like you do have a pinched nerve in your low back that is causing your left leg pain.  I am ordering an epidural steroid injection.  You should hear from [facility] imaging soon about that. Also I have ordered a DEXA scan to evaluate the chronic compression fractures that you have had in your low back.

## 2022-10-11 NOTE — Telephone Encounter (Signed)
Pt called back & stated she forgot to ask you if she should continue doing PT twice weekly & chiropractor once weekly?

## 2022-10-12 NOTE — Telephone Encounter (Signed)
I think it is reasonable to continue physical therapy.  However you may want to try the injection first and see how you feel then decide on further physical therapy.

## 2022-10-26 ENCOUNTER — Ambulatory Visit
Admission: RE | Admit: 2022-10-26 | Discharge: 2022-10-26 | Disposition: A | Discharge status: Home / Self Care | Payer: BC Managed Care – PPO | Source: Ambulatory Visit | Attending: Nurse Practitioner | Admitting: Nurse Practitioner

## 2022-10-26 DIAGNOSIS — Z853 Personal history of malignant neoplasm of breast: Secondary | ICD-10-CM | POA: Diagnosis not present

## 2022-10-26 DIAGNOSIS — Z170 Estrogen receptor positive status [ER+]: Secondary | ICD-10-CM

## 2022-10-26 MED ORDER — GADOPICLENOL 0.5 MMOL/ML IV SOLN
7.5000 mL | Freq: Once | INTRAVENOUS | Status: AC | PRN
  Administered 2022-10-26: 7.5 mL via INTRAVENOUS

## 2022-11-09 ENCOUNTER — Ambulatory Visit
Admission: RE | Admit: 2022-11-09 | Discharge: 2022-11-09 | Disposition: A | Discharge status: Home / Self Care | Payer: BC Managed Care – PPO | Source: Ambulatory Visit | Attending: Family Medicine | Admitting: Family Medicine

## 2022-11-09 ENCOUNTER — Other Ambulatory Visit: Payer: BC Managed Care – PPO

## 2022-11-09 DIAGNOSIS — G8929 Other chronic pain: Secondary | ICD-10-CM

## 2022-11-09 DIAGNOSIS — R52 Pain, unspecified: Secondary | ICD-10-CM | POA: Diagnosis not present

## 2022-11-09 DIAGNOSIS — M47817 Spondylosis without myelopathy or radiculopathy, lumbosacral region: Secondary | ICD-10-CM | POA: Diagnosis not present

## 2022-11-09 MED ORDER — METHYLPREDNISOLONE ACETATE 40 MG/ML INJ SUSP (RADIOLOG
80.0000 mg | Freq: Once | INTRAMUSCULAR | Status: AC
  Administered 2022-11-09: 80 mg via EPIDURAL

## 2022-11-09 MED ORDER — IOPAMIDOL (ISOVUE-M 200) INJECTION 41%
1.0000 mL | Freq: Once | INTRAMUSCULAR | Status: AC
  Administered 2022-11-09: 1 mL via EPIDURAL

## 2022-11-09 NOTE — Discharge Instructions (Signed)

## 2022-11-11 ENCOUNTER — Encounter: Payer: Self-pay | Admitting: *Deleted

## 2022-12-20 ENCOUNTER — Encounter: Payer: Self-pay | Admitting: Internal Medicine

## 2022-12-20 ENCOUNTER — Ambulatory Visit: Payer: BC Managed Care – PPO | Admitting: Internal Medicine

## 2022-12-20 VITALS — BP 130/80 | HR 70 | Temp 98.3°F | Wt 130.0 lb

## 2022-12-20 DIAGNOSIS — R590 Localized enlarged lymph nodes: Secondary | ICD-10-CM | POA: Insufficient documentation

## 2022-12-20 DIAGNOSIS — R197 Diarrhea, unspecified: Secondary | ICD-10-CM

## 2022-12-20 NOTE — Assessment & Plan Note (Signed)
Suspect food bourne illness which is slowly improving. She is having more formed stools 1-2 times per day. Counseled on BRAT diet to help and can use imodium to help. If not improving/resolved in 1 week we can check stool studies.

## 2022-12-20 NOTE — Patient Instructions (Signed)
Try a probiotic or imodium to help and let me know if not improving in the next week

## 2022-12-20 NOTE — Progress Notes (Signed)
   Subjective:   Patient ID: Kelsey Simon, female    DOB: [birth year], 66 y.o.   MRN: [id number]  HPI The patient is a 67 YO female coming in for swollen node in neck and acute diarrhea illness.   Review of Systems  Constitutional: Negative.   HENT: Negative.    Eyes: Negative.   Respiratory:  Negative for cough, chest tightness and shortness of breath.   Cardiovascular:  Negative for chest pain, palpitations and leg swelling.  Gastrointestinal:  Positive for abdominal pain and diarrhea. Negative for abdominal distention, constipation, nausea and vomiting.  Musculoskeletal: Negative.   Skin: Negative.   Neurological: Negative.   Psychiatric/Behavioral: Negative.      Objective:  Physical Exam Constitutional:      Appearance: She is well-developed.  HENT:     Head: Normocephalic and atraumatic.  Cardiovascular:     Rate and Rhythm: Normal rate and regular rhythm.  Pulmonary:     Effort: Pulmonary effort is normal. No respiratory distress.     Breath sounds: Normal breath sounds. No wheezing or rales.  Abdominal:     General: Bowel sounds are normal. There is no distension.     Palpations: Abdomen is soft.     Tenderness: There is no abdominal tenderness. There is no rebound.  Musculoskeletal:     Cervical back: Normal range of motion.  Lymphadenopathy:     Cervical: No cervical adenopathy.  Skin:    General: Skin is warm and dry.  Neurological:     Mental Status: She is alert and oriented to person, place, and time.     Coordination: Coordination normal.     Vitals:   12/20/22 1025  BP: 130/80  Pulse: 70  Temp: 98.3 F (36.8 C)  TempSrc: Oral  SpO2: 99%  Weight: 130 lb (59 kg)    Assessment & Plan:  Visit time 15 minutes in face to face communication with patient and coordination of care, additional 5 minutes spent in record review, coordination or care, ordering tests, communicating/referring to other healthcare professionals, documenting in medical  records all on the same day of the visit for total time 20 minutes spent on the visit.

## 2022-12-20 NOTE — Assessment & Plan Note (Signed)
Concurrent with viral illness which is improving. The LN is normal size on exam. Reassurance given.

## 2023-02-07 ENCOUNTER — Ambulatory Visit: Payer: BC Managed Care – PPO | Attending: General Surgery

## 2023-02-07 VITALS — Wt 133.0 lb

## 2023-02-07 DIAGNOSIS — Z483 Aftercare following surgery for neoplasm: Secondary | ICD-10-CM | POA: Insufficient documentation

## 2023-02-07 NOTE — Therapy (Signed)
  OUTPATIENT PHYSICAL THERAPY SOZO SCREENING NOTE   Patient Name: Kelsey Simon MRN: [id number] DOB:[birth year], 67 y.o., female Today's Date: 02/07/2023  PCP: Myrlene Broker, MD REFERRING PROVIDER: Griselda Miner, MD   PT End of Session - 02/07/23 1510     Visit Number 1   #unchanged due to screen only   PT Start Time 1507    PT Stop Time 1510    PT Time Calculation (min) 3 min    Activity Tolerance Patient tolerated treatment well    Behavior During Therapy [facility] for tasks assessed/performed              Past Medical History:  Diagnosis Date   Hx of adenomatous colonic polyps 05/18/2018   Hypertension    IBS (irritable bowel syndrome)    PONV (postoperative nausea and vomiting)    Varicose veins    Past Surgical History:  Procedure Laterality Date   BREAST BIOPSY Right 2017 and 2006   x 2   BREAST EXCISIONAL BIOPSY     BREAST LUMPECTOMY WITH RADIOACTIVE SEED AND SENTINEL LYMPH NODE BIOPSY Right 04/16/2020   Procedure: BREAST LUMPECTOMY WITH RADIOACTIVE SEED AND SENTINEL LYMPH NODE BIOPSY;  Surgeon: Griselda Miner, MD;  Location: [facility];  Service: General;  Laterality: Right;   BREAST LUMPECTOMY WITH RADIOACTIVE SEED LOCALIZATION Right 02/13/2016   Procedure: RIGHT BREAST LUMPECTOMY WITH RADIOACTIVE SEED LOCALIZATION;  Surgeon: Chevis Pretty III, MD;  Location: [facility];  Service: General;  Laterality: Right;   COLONOSCOPY     NASAL SEPTUM SURGERY  1990   UPPER GASTROINTESTINAL ENDOSCOPY     WISDOM TOOTH EXTRACTION     Patient Active Problem List   Diagnosis Date Noted   Enlarged lymph node in neck 12/20/2022   Trochanteric bursitis of left hip 06/29/2022   HX: breast cancer 03/02/2020   Routine general medical examination at a health care facility 01/20/2020   Hx of adenomatous colonic polyps 05/18/2018   Diarrhea 03/24/2016   Varicose vein of leg 03/15/2012   Essential hypertension     REFERRING DIAG: right breast  cancer at risk for lymphedema  THERAPY DIAG: Aftercare following surgery for neoplasm  PERTINENT HISTORY: Rt lumpectomy with SLNB on 04/16/20, currently undergoing radiation   PRECAUTIONS: right UE Lymphedema risk, None  SUBJECTIVE: Pt returns for her 3 month L-Dex screen.   PAIN:  Are you having pain? No  SOZO SCREENING: Patient was assessed today using the SOZO machine to determine the lymphedema index score. This was compared to her baseline score. It was determined that she is within the recommended range when compared to her baseline and no further action is needed at this time. She will continue SOZO screenings. These are done every 3 months for 2 years post operatively followed by every 6 months for 2 years, and then annually.  Every 3 months until 04/16/22 and then every 6 months until 04/16/24   L-DEX FLOWSHEETS - 02/07/23 1500       L-DEX LYMPHEDEMA SCREENING   Measurement Type Unilateral    L-DEX MEASUREMENT EXTREMITY Upper Extremity    POSITION  Standing    DOMINANT SIDE Right    At Risk Side Right    BASELINE SCORE (UNILATERAL) 0.8    L-DEX SCORE (UNILATERAL) 1.4    VALUE CHANGE (UNILAT) 0.6               Hermenia Bers, PTA 02/07/2023, 3:11 PM

## 2023-02-23 NOTE — Progress Notes (Signed)
Patient Care Team: Myrlene Broker, MD as PCP - General (Internal Medicine) Malachy Mood, MD as Consulting Physician (Hematology) Griselda Miner, MD as Consulting Physician (General Surgery) Donnelly Angelica, RN as Oncology Nurse Navigator Pershing Proud, RN as Oncology Nurse Navigator Carmina Miller, MD as Radiation Oncologist (Radiation Oncology) Pollyann Samples, NP as Nurse Practitioner (Nurse Practitioner)   CHIEF COMPLAINT: Follow up right breast cancer   Oncology History Overview Note  Cancer Staging Malignant neoplasm of upper-inner quadrant of right breast in female, estrogen receptor positive [facility]) Staging form: Breast, AJCC 8th Edition - Clinical stage from 02/18/2020: Stage IA (cT1b, cN0, cM0, G1, ER+, PR+, HER2-) - Signed by Malachy Mood, MD on 03/02/2020    HX: breast cancer  02/05/2020 Mammogram   Mammogram 02/05/20  IMPRESSION: Right breast mass at 2 o'clock measuring 0.5 x 0.6 x 0.6 cm and 5cmfn is indeterminate.   02/18/2020 Cancer Staging   Staging form: Breast, AJCC 8th Edition - Clinical stage from 02/18/2020: Stage IA (cT1b, cN0, cM0, G1, ER+, PR+, HER2-) - Signed by Malachy Mood, MD on 03/02/2020   02/18/2020 Initial Biopsy   Diagnosis 02/18/20  Breast, right, needle core biopsy, 2 o'clock - INVASIVE DUCTAL CARCINOMA, GRADE 1. - SEE MICROSCOPIC DESCRIPTION Microscopic Comment The greatest tumor dimension is 0.7 cm. A breast prognostic profile will be performed. Immunohistochemistry of basal cell markers (Calponin, p63 and smooth muscle myosin) supports the diagnosis.   02/18/2020 Receptors her2   PROGNOSTIC INDICATORS Results: IMMUNOHISTOCHEMICAL AND MORPHOMETRIC ANALYSIS PERFORMED MANUALLY The tumor cells are NEGATIVE for Her2 (1+). Estrogen Receptor: 95%, POSITIVE, STRONG STAINING INTENSITY Progesterone Receptor: 95%, POSITIVE, STRONG STAINING INTENSITY Proliferation Marker Ki67: 5%   03/02/2020 Initial Diagnosis   Malignant neoplasm of upper-inner  quadrant of right breast in female, estrogen receptor positive (HCC)   04/16/2020 Surgery   BREAST LUMPECTOMY WITH RADIOACTIVE SEED AND SENTINEL LYMPH NODE BIOPSY by Dr Carolynne Edouard    04/16/2020 Pathology Results   FINAL MICROSCOPIC DIAGNOSIS:   A. LYMPH NODE RIGHT AXILLARY #1, SENTINEL, EXCISION:  - Lymph node, negative for carcinoma (0/1)   B. BREAST, RIGHT, LUMPECTOMY:  - Invasive ductal carcinoma, 0.6 cm, grade 1, with calcifications  - Resection margins are negative for carcinoma; closest are the inferior  margin at 1.5 mm and superior margin at 4 mm  - Negative for lymphovascular or perineural invasion  - See oncology table   04/16/2020 Cancer Staging   Staging form: Breast, AJCC 8th Edition - Pathologic stage from 04/16/2020: Stage IA (pT1b, pN0, cM0, G1, ER+, PR+, HER2-) - Signed by Pollyann Samples, NP on 12/16/2020 Histologic grading system: 3 grade system   05/01/2020 Imaging   DEXA ASSESSMENT: The BMD measured at Femur Total Left is 0.889 g/cm2 with a T-score of -0.9. This patient is considered normal according to World Health Organization [facility]) criteria. The scan quality is good.   05/13/2020 - 06/13/2020 Radiation Therapy   Adjuvant Radiation with Dr Genelle Bal in [facility]    05/2020 -  Anti-estrogen oral therapy   She declined antiestrogen therapy.    01/20/2021 Survivorship   SCP delivered virtually by Santiago Glad, NP      CURRENT THERAPY: Surveillance/high risk screening with annual mammogram and breast MRI staggered 6 months apart     INTERVAL HISTORY Ms. Ursua returns for follow up as scheduled, last seen by me 08/26/22.   ROS   Past Medical History:  Diagnosis Date   Hx of adenomatous colonic polyps 05/18/2018  Hypertension    IBS (irritable bowel syndrome)    PONV (postoperative nausea and vomiting)    Varicose veins      Past Surgical History:  Procedure Laterality Date   BREAST BIOPSY Right 2017 and 2006   x 2   BREAST EXCISIONAL  BIOPSY     BREAST LUMPECTOMY WITH RADIOACTIVE SEED AND SENTINEL LYMPH NODE BIOPSY Right 04/16/2020   Procedure: BREAST LUMPECTOMY WITH RADIOACTIVE SEED AND SENTINEL LYMPH NODE BIOPSY;  Surgeon: Griselda Miner, MD;  Location: [facility];  Service: General;  Laterality: Right;   BREAST LUMPECTOMY WITH RADIOACTIVE SEED LOCALIZATION Right 02/13/2016   Procedure: RIGHT BREAST LUMPECTOMY WITH RADIOACTIVE SEED LOCALIZATION;  Surgeon: Chevis Pretty III, MD;  Location: [facility];  Service: General;  Laterality: Right;   COLONOSCOPY     NASAL SEPTUM SURGERY  1990   UPPER GASTROINTESTINAL ENDOSCOPY     WISDOM TOOTH EXTRACTION       Outpatient Encounter Medications as of 02/24/2023  Medication Sig   b complex vitamins capsule Take 1 capsule by mouth daily.   BIOTIN PO Take by mouth daily.   cholecalciferol (VITAMIN D3) 25 MCG (1000 UT) tablet Take 1,000 Units by mouth daily.   clonazePAM (KLONOPIN) 0.5 MG tablet TAKE ONE-HALF TABLET BY MOUTH AS NEEDED FOR SLEEP   dicyclomine (BENTYL) 20 MG tablet Take 1 tablet (20 mg total) by mouth as needed.   gabapentin (NEURONTIN) 100 MG capsule Take 1-3 capsules (100-300 mg total) by mouth at bedtime as needed (nerve pain).   Magnesium 250 MG TABS Take by mouth.   triamterene-hydrochlorothiazide (MAXZIDE-25) 37.5-25 MG tablet Take 1 tablet by mouth daily.   No facility-administered encounter medications on file as of 02/24/2023.     There were no vitals filed for this visit. There is no height or weight on file to calculate BMI.   PHYSICAL EXAM GENERAL:alert, no distress and comfortable SKIN: no rash  EYES: sclera clear NECK: without mass LYMPH:  no palpable cervical or supraclavicular lymphadenopathy  LUNGS: clear with normal breathing effort HEART: regular rate & rhythm, no lower extremity edema ABDOMEN: abdomen soft, non-tender and normal bowel sounds NEURO: alert & oriented x 3 with fluent speech, no focal motor/sensory  deficits Breast exam:  PAC without erythema    CBC    Component Value Date/Time   WBC 5.3 08/26/2022 0826   WBC 6.1 05/28/2020 1532   RBC 5.13 (H) 08/26/2022 0826   HGB 14.3 08/26/2022 0826   HCT 43.1 08/26/2022 0826   PLT 296 08/26/2022 0826   MCV 84.0 08/26/2022 0826   MCH 27.9 08/26/2022 0826   MCHC 33.2 08/26/2022 0826   RDW 11.8 08/26/2022 0826   LYMPHSABS 1.0 08/26/2022 0826   MONOABS 0.4 08/26/2022 0826   EOSABS 0.1 08/26/2022 0826   BASOSABS 0.0 08/26/2022 0826     CMP     Component Value Date/Time   NA 137 08/26/2022 0826   K 3.9 08/26/2022 0826   CL 100 08/26/2022 0826   CO2 30 08/26/2022 0826   GLUCOSE 114 (H) 08/26/2022 0826   BUN 15 08/26/2022 0826   CREATININE 0.80 08/26/2022 0826   CREATININE 0.83 07/27/2013 1552   CALCIUM 9.4 08/26/2022 0826   PROT 6.7 08/26/2022 0826   ALBUMIN 4.3 08/26/2022 0826   AST 20 08/26/2022 0826   ALT 20 08/26/2022 0826   ALKPHOS 37 (L) 08/26/2022 0826   BILITOT 0.6 08/26/2022 0826   GFRNONAA >60 08/26/2022 0826   GFRAA >  60 02/09/2016 0930     ASSESSMENT & PLAN:Kelsey Simon is a 68 y.o. post-menopausal female with    1. Malignant neoplasm of upper-inner quadrant of right breast, Stage IA, p(T1bN0M0), ER+/PR+/HER2-, Grade I  -Diagnosed in 01/2020 with grade I invasive ductal carcinoma, ER and PR strongly positive, HER2(-).  -S/p right lumpectomy with SLNB by Dr Carolynne Edouard on 04/16/20 showed 0.6cm invasive ductal carcinoma was completely resected, clear margins and node negative. Oncotype was not indicated.  -S/p adjuvant Radiation 05/13/20-06/14/19. She tolerated well.  -she declined antiestrogen therapy. She is now on surveillance/high risk screening program with annual mammogram and breast MRI staggered 6 months apart.  -Last mammo 04/06/22 and MRI 10/26/22 were benign    2. Co-morbidities: HTN, IBS, Anxiety  -Controlled on current med regimen   3. Bone Health -Her baseline DEXA from 05/01/20 was normal with lowest T-score  -0.9 at left total femur.  -Continue calcium, vitamin D, and weightbearing exercise -Per PCP      PLAN:  No orders of the defined types were placed in this encounter.     All questions were answered. The patient knows to call the clinic with any problems, questions or concerns. No barriers to learning were detected. I spent *** counseling the patient face to face. The total time spent in the appointment was *** and more than 50% was on counseling, review of test results, and coordination of care.   Santiago Glad, NP-C @DATE @

## 2023-02-24 ENCOUNTER — Encounter: Payer: Self-pay | Admitting: Nurse Practitioner

## 2023-02-24 ENCOUNTER — Inpatient Hospital Stay: Payer: BC Managed Care – PPO | Admitting: Nurse Practitioner

## 2023-02-24 ENCOUNTER — Inpatient Hospital Stay: Payer: BC Managed Care – PPO | Attending: Nurse Practitioner

## 2023-02-24 VITALS — BP 147/82 | HR 80 | Resp 17 | Wt 133.1 lb

## 2023-02-24 DIAGNOSIS — C50211 Malignant neoplasm of upper-inner quadrant of right female breast: Secondary | ICD-10-CM

## 2023-02-24 DIAGNOSIS — Z923 Personal history of irradiation: Secondary | ICD-10-CM | POA: Insufficient documentation

## 2023-02-24 DIAGNOSIS — Z170 Estrogen receptor positive status [ER+]: Secondary | ICD-10-CM

## 2023-02-24 DIAGNOSIS — Z853 Personal history of malignant neoplasm of breast: Secondary | ICD-10-CM | POA: Insufficient documentation

## 2023-02-24 LAB — CBC WITH DIFFERENTIAL (CANCER CENTER ONLY)
Abs Immature Granulocytes: 0.02 10*3/uL (ref 0.00–0.07)
Basophils Absolute: 0 10*3/uL (ref 0.0–0.1)
Basophils Relative: 0 %
Eosinophils Absolute: 0.1 10*3/uL (ref 0.0–0.5)
Eosinophils Relative: 1 %
HCT: 40.9 % (ref 36.0–46.0)
Hemoglobin: 13.6 g/dL (ref 12.0–15.0)
Immature Granulocytes: 0 %
Lymphocytes Relative: 15 %
Lymphs Abs: 0.9 10*3/uL (ref 0.7–4.0)
MCH: 28.3 pg (ref 26.0–34.0)
MCHC: 33.3 g/dL (ref 30.0–36.0)
MCV: 85 fL (ref 80.0–100.0)
Monocytes Absolute: 0.5 10*3/uL (ref 0.1–1.0)
Monocytes Relative: 9 %
Neutro Abs: 4.4 10*3/uL (ref 1.7–7.7)
Neutrophils Relative %: 75 %
Platelet Count: 282 10*3/uL (ref 150–400)
RBC: 4.81 MIL/uL (ref 3.87–5.11)
RDW: 12 % (ref 11.5–15.5)
WBC Count: 5.9 10*3/uL (ref 4.0–10.5)
nRBC: 0 % (ref 0.0–0.2)

## 2023-02-24 LAB — CMP (CANCER CENTER ONLY)
ALT: 21 U/L (ref 0–44)
AST: 22 U/L (ref 15–41)
Albumin: 4 g/dL (ref 3.5–5.0)
Alkaline Phosphatase: 41 U/L (ref 38–126)
Anion gap: 5 (ref 5–15)
BUN: 14 mg/dL (ref 8–23)
CO2: 31 mmol/L (ref 22–32)
Calcium: 9.3 mg/dL (ref 8.9–10.3)
Chloride: 99 mmol/L (ref 98–111)
Creatinine: 0.73 mg/dL (ref 0.44–1.00)
GFR, Estimated: >60 mL/min (ref >60)
Glucose, Bld: 72 mg/dL (ref 70–99)
Potassium: 4.4 mmol/L (ref 3.5–5.1)
Sodium: 135 mmol/L (ref 135–145)
Total Bilirubin: 0.6 mg/dL (ref 0.3–1.2)
Total Protein: 6.4 g/dL — ABNORMAL LOW (ref 6.5–8.1)

## 2023-03-01 DIAGNOSIS — L309 Dermatitis, unspecified: Secondary | ICD-10-CM | POA: Diagnosis not present

## 2023-04-07 ENCOUNTER — Other Ambulatory Visit: Payer: Self-pay | Admitting: Internal Medicine

## 2023-04-08 MED ORDER — DICYCLOMINE HCL 20 MG PO TABS: 20.0000 mg | ORAL_TABLET | ORAL | 0 refills | Status: DC | PRN

## 2023-04-18 ENCOUNTER — Ambulatory Visit
Admission: RE | Admit: 2023-04-18 | Discharge: 2023-04-18 | Disposition: A | Discharge status: Home / Self Care | Payer: BC Managed Care – PPO | Source: Ambulatory Visit | Attending: Nurse Practitioner | Admitting: Nurse Practitioner

## 2023-04-18 DIAGNOSIS — Z170 Estrogen receptor positive status [ER+]: Secondary | ICD-10-CM

## 2023-04-18 DIAGNOSIS — Z853 Personal history of malignant neoplasm of breast: Secondary | ICD-10-CM | POA: Diagnosis not present

## 2023-06-08 ENCOUNTER — Inpatient Hospital Stay: Admission: RE | Admit: 2023-06-08 | Payer: BC Managed Care – PPO | Source: Ambulatory Visit

## 2023-08-08 ENCOUNTER — Ambulatory Visit: Payer: BC Managed Care – PPO | Attending: General Surgery

## 2023-08-08 VITALS — Wt 134.4 lb

## 2023-08-08 DIAGNOSIS — Z483 Aftercare following surgery for neoplasm: Secondary | ICD-10-CM | POA: Insufficient documentation

## 2023-08-08 NOTE — Therapy (Signed)
  OUTPATIENT PHYSICAL THERAPY SOZO SCREENING NOTE   Patient Name: Kelsey Simon MRN: [id number] DOB:[birth year], 68 y.o., female Today's Date: 08/08/2023  PCP: Myrlene Broker, MD REFERRING PROVIDER: Griselda Miner, MD   PT End of Session - 08/08/23 1526     Visit Number 1   # unchanged due to screen only   PT Start Time 1524    PT Stop Time 1528    PT Time Calculation (min) 4 min    Activity Tolerance Patient tolerated treatment well    Behavior During Therapy [facility] for tasks assessed/performed              Past Medical History:  Diagnosis Date   Hx of adenomatous colonic polyps 05/18/2018   Hypertension    IBS (irritable bowel syndrome)    PONV (postoperative nausea and vomiting)    Varicose veins    Past Surgical History:  Procedure Laterality Date   BREAST BIOPSY Right 2017 and 2006   x 2   BREAST EXCISIONAL BIOPSY     BREAST LUMPECTOMY WITH RADIOACTIVE SEED AND SENTINEL LYMPH NODE BIOPSY Right 04/16/2020   Procedure: BREAST LUMPECTOMY WITH RADIOACTIVE SEED AND SENTINEL LYMPH NODE BIOPSY;  Surgeon: Griselda Miner, MD;  Location: [facility];  Service: General;  Laterality: Right;   BREAST LUMPECTOMY WITH RADIOACTIVE SEED LOCALIZATION Right 02/13/2016   Procedure: RIGHT BREAST LUMPECTOMY WITH RADIOACTIVE SEED LOCALIZATION;  Surgeon: Chevis Pretty III, MD;  Location: [facility];  Service: General;  Laterality: Right;   COLONOSCOPY     NASAL SEPTUM SURGERY  1990   UPPER GASTROINTESTINAL ENDOSCOPY     WISDOM TOOTH EXTRACTION     Patient Active Problem List   Diagnosis Date Noted   Enlarged lymph node in neck 12/20/2022   Trochanteric bursitis of left hip 06/29/2022   HX: breast cancer 03/02/2020   Routine general medical examination at a health care facility 01/20/2020   Hx of adenomatous colonic polyps 05/18/2018   Diarrhea 03/24/2016   Varicose vein of leg 03/15/2012   Essential hypertension     REFERRING DIAG: right breast  cancer at risk for lymphedema  THERAPY DIAG: Aftercare following surgery for neoplasm  PERTINENT HISTORY: Rt lumpectomy with SLNB on 04/16/20, currently undergoing radiation   PRECAUTIONS: right UE Lymphedema risk, None  SUBJECTIVE: Pt returns for her 6 month L-Dex screen.   PAIN:  Are you having pain? No  SOZO SCREENING: Patient was assessed today using the SOZO machine to determine the lymphedema index score. This was compared to her baseline score. It was determined that she is within the recommended range when compared to her baseline and no further action is needed at this time. She will continue SOZO screenings. These are done every 3 months for 2 years post operatively followed by every 6 months for 2 years, and then annually.  Every 3 months until 04/16/22 and then every 6 months until 04/16/24   L-DEX FLOWSHEETS - 08/08/23 1500       L-DEX LYMPHEDEMA SCREENING   Measurement Type Unilateral    L-DEX MEASUREMENT EXTREMITY Upper Extremity    POSITION  Standing    DOMINANT SIDE Right    At Risk Side Right    BASELINE SCORE (UNILATERAL) 0.8    L-DEX SCORE (UNILATERAL) -0.2    VALUE CHANGE (UNILAT) -1               Hermenia Bers, PTA 08/08/2023, 3:27 PM

## 2023-08-25 ENCOUNTER — Inpatient Hospital Stay (HOSPITAL_BASED_OUTPATIENT_CLINIC_OR_DEPARTMENT_OTHER): Payer: BC Managed Care – PPO | Admitting: Hematology

## 2023-08-25 ENCOUNTER — Inpatient Hospital Stay: Payer: BC Managed Care – PPO | Attending: Hematology

## 2023-08-25 ENCOUNTER — Encounter: Payer: Self-pay | Admitting: Hematology

## 2023-08-25 VITALS — BP 146/91 | HR 74 | Temp 97.8°F | Resp 17 | Wt 135.7 lb

## 2023-08-25 DIAGNOSIS — Z923 Personal history of irradiation: Secondary | ICD-10-CM | POA: Insufficient documentation

## 2023-08-25 DIAGNOSIS — C50211 Malignant neoplasm of upper-inner quadrant of right female breast: Secondary | ICD-10-CM

## 2023-08-25 DIAGNOSIS — Z170 Estrogen receptor positive status [ER+]: Secondary | ICD-10-CM | POA: Insufficient documentation

## 2023-08-25 DIAGNOSIS — Z1721 Progesterone receptor positive status: Secondary | ICD-10-CM | POA: Diagnosis not present

## 2023-08-25 DIAGNOSIS — Z9189 Other specified personal risk factors, not elsewhere classified: Secondary | ICD-10-CM

## 2023-08-25 LAB — CBC WITH DIFFERENTIAL (CANCER CENTER ONLY)
Abs Immature Granulocytes: 0 10*3/uL (ref 0.00–0.07)
Basophils Absolute: 0 10*3/uL (ref 0.0–0.1)
Basophils Relative: 0 %
Eosinophils Absolute: 0.1 10*3/uL (ref 0.0–0.5)
Eosinophils Relative: 1 %
HCT: 42.5 % (ref 36.0–46.0)
Hemoglobin: 14.1 g/dL (ref 12.0–15.0)
Immature Granulocytes: 0 %
Lymphocytes Relative: 22 %
Lymphs Abs: 1 10*3/uL (ref 0.7–4.0)
MCH: 27.9 pg (ref 26.0–34.0)
MCHC: 33.2 g/dL (ref 30.0–36.0)
MCV: 84.2 fL (ref 80.0–100.0)
Monocytes Absolute: 0.4 10*3/uL (ref 0.1–1.0)
Monocytes Relative: 8 %
Neutro Abs: 3.2 10*3/uL (ref 1.7–7.7)
Neutrophils Relative %: 69 %
Platelet Count: 281 10*3/uL (ref 150–400)
RBC: 5.05 MIL/uL (ref 3.87–5.11)
RDW: 12.3 % (ref 11.5–15.5)
WBC Count: 4.7 10*3/uL (ref 4.0–10.5)
nRBC: 0 % (ref 0.0–0.2)

## 2023-08-25 LAB — CMP (CANCER CENTER ONLY)
ALT: 21 U/L (ref 0–44)
AST: 22 U/L (ref 15–41)
Albumin: 4.3 g/dL (ref 3.5–5.0)
Alkaline Phosphatase: 41 U/L (ref 38–126)
Anion gap: 4 — ABNORMAL LOW (ref 5–15)
BUN: 16 mg/dL (ref 8–23)
CO2: 31 mmol/L (ref 22–32)
Calcium: 9.6 mg/dL (ref 8.9–10.3)
Chloride: 101 mmol/L (ref 98–111)
Creatinine: 0.75 mg/dL (ref 0.44–1.00)
GFR, Estimated: >60 mL/min (ref >60)
Glucose, Bld: 85 mg/dL (ref 70–99)
Potassium: 4.1 mmol/L (ref 3.5–5.1)
Sodium: 136 mmol/L (ref 135–145)
Total Bilirubin: 0.6 mg/dL (ref 0.0–1.2)
Total Protein: 6.8 g/dL (ref 6.5–8.1)

## 2023-08-25 NOTE — Assessment & Plan Note (Signed)
 Stage IA, p(T1bN0M0), ER+/PR+/HER2-, Grade I  -Diagnosed in 01/2020 with grade I invasive ductal carcinoma, ER and PR strongly positive, HER2(-).  -S/p right lumpectomy with SLNB by Dr Carolynne Edouard on 04/16/20 showed 0.6cm invasive ductal carcinoma was completely resected, clear margins and node negative. Oncotype was not indicated.  -S/p adjuvant Radiation 05/13/20-06/14/19. She tolerated well.  -she declined antiestrogen therapy. She is on surveillance/high risk screening program with annual mammogram and breast MRI staggered 6 months apart.

## 2023-08-25 NOTE — Progress Notes (Signed)
 [facility]   [phone] [phone]   Clinic Follow up Note   Patient Care Team: Myrlene Broker, MD as PCP - General (Internal Medicine) Malachy Mood, MD as Consulting Physician (Hematology) Griselda Miner, MD as Consulting Physician (General Surgery) Donnelly Angelica, RN as Oncology Nurse Navigator Pershing Proud, RN as Oncology Nurse Navigator Carmina Miller, MD as Radiation Oncologist (Radiation Oncology) Pollyann Samples, NP as Nurse Practitioner (Nurse Practitioner)  Date of Service:  08/25/2023  CHIEF COMPLAINT: f/u of breast cancer  CURRENT THERAPY:  Cancer surveillance  Oncology History   Malignant neoplasm of upper-inner quadrant of right breast in female, estrogen receptor positive (HCC) Stage IA, p(T1bN0M0), ER+/PR+/HER2-, Grade I  -Diagnosed in 01/2020 with grade I invasive ductal carcinoma, ER and PR strongly positive, HER2(-).  -S/p right lumpectomy with SLNB by Dr Carolynne Edouard on 04/16/20 showed 0.6cm invasive ductal carcinoma was completely resected, clear margins and node negative. Oncotype was not indicated.  -S/p adjuvant Radiation 05/13/20-06/14/19. She tolerated well.  -she declined antiestrogen therapy. She is on surveillance/high risk screening program with annual mammogram and breast MRI staggered 6 months apart.     Assessment and Plan    Breast cancer follow-up Diagnosed with breast cancer in 2021, she is 4.5 years post-diagnosis with a history of right breast lumpectomy. She reports no new breast issues, only occasional post-surgical neuralgia. She undergoes annual mammograms and is interested in another MRI, last performed in May 2024. The risk of recurrence decreases after five years but persists, with some risk between years five to ten and minimal risk from ten to twenty years. She prefers to continue annual oncology follow-ups despite the reduced risk. - Order breast MRI for May 2025 - Schedule annual mammogram for  November 2025 - Follow up in one year  Sciatica She experienced a seven-month episode of right-sided sciatica, resolved six months ago. Chiropractic care and a vertebral injection alleviated the pain, including residual calf pain.  Arthritis She reports back and hip pain attributed to arthritis. Chiropractic care provides temporary relief but results in soreness two days later.      Plan -Lab reviewed, she is clinically doing well, no concern for cancer recurrence. -Continue cancer surveillance.  Lab and follow-up with NP Lacie in 1 year  SUMMARY OF ONCOLOGIC HISTORY: Oncology History Overview Note  Cancer Staging Malignant neoplasm of upper-inner quadrant of right breast in female, estrogen receptor positive (HCC) Staging form: Breast, AJCC 8th Edition - Clinical stage from 02/18/2020: Stage IA (cT1b, cN0, cM0, G1, ER+, PR+, HER2-) - Signed by Malachy Mood, MD on 03/02/2020    Malignant neoplasm of upper-inner quadrant of right breast in female, estrogen receptor positive (HCC)  02/05/2020 Mammogram   Mammogram 02/05/20  IMPRESSION: Right breast mass at 2 o'clock measuring 0.5 x 0.6 x 0.6 cm and 5cmfn is indeterminate.   02/18/2020 Cancer Staging   Staging form: Breast, AJCC 8th Edition - Clinical stage from 02/18/2020: Stage IA (cT1b, cN0, cM0, G1, ER+, PR+, HER2-) - Signed by Malachy Mood, MD on 03/02/2020   02/18/2020 Initial Biopsy   Diagnosis 02/18/20  Breast, right, needle core biopsy, 2 o'clock - INVASIVE DUCTAL CARCINOMA, GRADE 1. - SEE MICROSCOPIC DESCRIPTION Microscopic Comment The greatest tumor dimension is 0.7 cm. A breast prognostic profile will be performed. Immunohistochemistry of basal cell markers (Calponin, p63 and smooth muscle myosin) supports the diagnosis.   02/18/2020 Receptors her2   PROGNOSTIC INDICATORS Results: IMMUNOHISTOCHEMICAL AND MORPHOMETRIC ANALYSIS PERFORMED MANUALLY The tumor  cells are NEGATIVE for Her2 (1+). Estrogen Receptor: 95%, POSITIVE, STRONG  STAINING INTENSITY Progesterone Receptor: 95%, POSITIVE, STRONG STAINING INTENSITY Proliferation Marker Ki67: 5%   03/02/2020 Initial Diagnosis   Malignant neoplasm of upper-inner quadrant of right breast in female, estrogen receptor positive (HCC)   04/16/2020 Surgery   BREAST LUMPECTOMY WITH RADIOACTIVE SEED AND SENTINEL LYMPH NODE BIOPSY by Dr Carolynne Edouard    04/16/2020 Pathology Results   FINAL MICROSCOPIC DIAGNOSIS:   A. LYMPH NODE RIGHT AXILLARY #1, SENTINEL, EXCISION:  - Lymph node, negative for carcinoma (0/1)   B. BREAST, RIGHT, LUMPECTOMY:  - Invasive ductal carcinoma, 0.6 cm, grade 1, with calcifications  - Resection margins are negative for carcinoma; closest are the inferior  margin at 1.5 mm and superior margin at 4 mm  - Negative for lymphovascular or perineural invasion  - See oncology table   04/16/2020 Cancer Staging   Staging form: Breast, AJCC 8th Edition - Pathologic stage from 04/16/2020: Stage IA (pT1b, pN0, cM0, G1, ER+, PR+, HER2-) - Signed by Pollyann Samples, NP on 12/16/2020 Histologic grading system: 3 grade system   05/01/2020 Imaging   DEXA ASSESSMENT: The BMD measured at Femur Total Left is 0.889 g/cm2 with a T-score of -0.9. This patient is considered normal according to World Health Organization [facility]) criteria. The scan quality is good.   05/13/2020 - 06/13/2020 Radiation Therapy   Adjuvant Radiation with Dr Genelle Bal in [facility]    05/2020 -  Anti-estrogen oral therapy   She declined antiestrogen therapy.    01/20/2021 Survivorship   SCP delivered virtually by Santiago Glad, NP      Discussed the use of AI scribe software for clinical note transcription with the patient, who gave verbal consent to proceed.  History of Present Illness   The patient, a 68 year old female with a history of breast cancer, presents for a routine follow-up. She reports occasional shooting pains in the breast, which she attributes to previous surgery. She  denies any new issues in the past six months and overall, she feels well. She has been experiencing back and hip pain, which she attributes to arthritis. She has been seeing a chiropractor for this, which provides temporary relief. She also had a seven-month bout with sciatica on the right side, which resolved last September with chiropractic care and an injection from a sports medicine doctor. She has since resumed working out. She has retired and is now on [employer]. She has a mammogram scheduled and would like to schedule another MRI.         All other systems were reviewed with the patient and are negative.  MEDICAL HISTORY:  Past Medical History:  Diagnosis Date   Hx of adenomatous colonic polyps 05/18/2018   Hypertension    IBS (irritable bowel syndrome)    PONV (postoperative nausea and vomiting)    Varicose veins     SURGICAL HISTORY: Past Surgical History:  Procedure Laterality Date   BREAST BIOPSY Right 2017 and 2006   x 2   BREAST EXCISIONAL BIOPSY     BREAST LUMPECTOMY WITH RADIOACTIVE SEED AND SENTINEL LYMPH NODE BIOPSY Right 04/16/2020   Procedure: BREAST LUMPECTOMY WITH RADIOACTIVE SEED AND SENTINEL LYMPH NODE BIOPSY;  Surgeon: Griselda Miner, MD;  Location: [facility];  Service: General;  Laterality: Right;   BREAST LUMPECTOMY WITH RADIOACTIVE SEED LOCALIZATION Right 02/13/2016   Procedure: RIGHT BREAST LUMPECTOMY WITH RADIOACTIVE SEED LOCALIZATION;  Surgeon: Chevis Pretty III, MD;  [facility];  Service: General;  Laterality: Right;   COLONOSCOPY     NASAL SEPTUM SURGERY  1990   UPPER GASTROINTESTINAL ENDOSCOPY     WISDOM TOOTH EXTRACTION      I have reviewed the social history and family history with the patient and they are unchanged from previous note.  ALLERGIES:  is allergic to penicillins.  MEDICATIONS:  Current Outpatient Medications  Medication Sig Dispense Refill   b complex vitamins capsule Take 1 capsule by mouth  daily.     BIOTIN PO Take by mouth daily.     cholecalciferol (VITAMIN D3) 25 MCG (1000 UT) tablet Take 1,000 Units by mouth daily.     clonazePAM (KLONOPIN) 0.5 MG tablet TAKE ONE-HALF TABLET BY MOUTH AS NEEDED FOR SLEEP 30 tablet 0   dicyclomine (BENTYL) 20 MG tablet Take 1 tablet (20 mg total) by mouth as needed. 90 tablet 0   Magnesium 250 MG TABS Take by mouth.     triamterene-hydrochlorothiazide (MAXZIDE-25) 37.5-25 MG tablet Take 1 tablet by mouth daily. 90 tablet 3   zinc gluconate 50 MG tablet Take 50 mg by mouth daily.     No current facility-administered medications for this visit.    PHYSICAL EXAMINATION: ECOG PERFORMANCE STATUS: 1 - Symptomatic but completely ambulatory  Vitals:   08/25/23 0955 08/25/23 0956  BP: (!) 171/95 (!) 146/91  Pulse: 74   Resp: 17   Temp: 97.8 F (36.6 C)   SpO2: 100%    Wt Readings from Last 3 Encounters:  08/25/23 135 lb 11.2 oz (61.6 kg)  08/08/23 134 lb 6 oz (61 kg)  02/24/23 133 lb 1.6 oz (60.4 kg)     GENERAL:alert, no distress and comfortable SKIN: skin color, texture, turgor are normal, no rashes or significant lesions EYES: normal, Conjunctiva are pink and non-injected, sclera clear NECK: supple, thyroid normal size, non-tender, without nodularity LYMPH:  no palpable lymphadenopathy in the cervical, axillary  LUNGS: clear to auscultation and percussion with normal breathing effort HEART: regular rate & rhythm and no murmurs and no lower extremity edema ABDOMEN:abdomen soft, non-tender and normal bowel sounds Musculoskeletal:no cyanosis of digits and no clubbing  NEURO: alert & oriented x 3 with fluent speech, no focal motor/sensory deficits BREAST: Right breast scar tissue well-healed, no abnormalities. Left breast normal, no abnormalities. ABDOMEN: Liver normal, not enlarged, no swelling.      LABORATORY DATA:  I have reviewed the data as listed    Latest Ref Rng & Units 08/25/2023    9:41 AM 02/24/2023    8:52 AM  08/26/2022    8:26 AM  CBC  WBC 4.0 - 10.5 K/uL 4.7  5.9  5.3   Hemoglobin 12.0 - 15.0 g/dL 14.7  82.9  56.2   Hematocrit 36.0 - 46.0 % 42.5  40.9  43.1   Platelets 150 - 400 K/uL 281  282  296         Latest Ref Rng & Units 08/25/2023    9:41 AM 02/24/2023    9:32 AM 08/26/2022    8:26 AM  CMP  Glucose 70 - 99 mg/dL 85  72  130   BUN 8 - 23 mg/dL 16  14  15    Creatinine 0.44 - 1.00 mg/dL 8.65  7.84  6.96   Sodium 135 - 145 mmol/L 136  135  137   Potassium 3.5 - 5.1 mmol/L 4.1  4.4  3.9   Chloride 98 - 111 mmol/L 101  99  100  CO2 22 - 32 mmol/L 31  31  30    Calcium 8.9 - 10.3 mg/dL 9.6  9.3  9.4   Total Protein 6.5 - 8.1 g/dL 6.8  6.4  6.7   Total Bilirubin 0.0 - 1.2 mg/dL 0.6  0.6  0.6   Alkaline Phos 38 - 126 U/L 41  41  37   AST 15 - 41 U/L 22  22  20    ALT 0 - 44 U/L 21  21  20        RADIOGRAPHIC STUDIES: I have personally reviewed the radiological images as listed and agreed with the findings in the report. No results found.    Orders Placed This Encounter  Procedures   MR BREAST BILATERAL W WO CONTRAST INC CAD    Standing Status:   Future    Expected Date:   10/18/2023    Expiration Date:   08/24/2024    If indicated for the ordered procedure, I authorize the administration of contrast media per Radiology protocol:   Yes    What is the patient's sedation requirement?:   No Sedation    Does the patient have a pacemaker or implanted devices?:   No    Radiology Contrast Protocol - do NOT remove file path:   [email or web address]    Preferred imaging location?:   [facility] (table limit-550lbs)   MM Digital Screening    Standing Status:   Future    Expected Date:   04/19/2024    Expiration Date:   08/24/2024    Reason for Exam (SYMPTOM  OR DIAGNOSIS REQUIRED):   screening    Preferred imaging location?:   [facility]   All questions were answered. The patient knows to call the clinic with any problems, questions or  concerns. No barriers to learning was detected. The total time spent in the appointment was 25 minutes.     Malachy Mood, MD 08/25/2023

## 2023-08-26 ENCOUNTER — Telehealth: Payer: Self-pay | Admitting: Hematology

## 2023-08-26 NOTE — Telephone Encounter (Signed)
 Scheduled appointments per 3/27 los. Talked with the patient and she is aware of the made appointments.

## 2023-09-16 ENCOUNTER — Ambulatory Visit
Admission: RE | Admit: 2023-09-16 | Discharge: 2023-09-16 | Disposition: A | Discharge status: Home / Self Care | Payer: BC Managed Care – PPO | Source: Ambulatory Visit | Attending: Family Medicine | Admitting: Family Medicine

## 2023-09-16 DIAGNOSIS — S32050A Wedge compression fracture of fifth lumbar vertebra, initial encounter for closed fracture: Secondary | ICD-10-CM

## 2023-09-16 DIAGNOSIS — M810 Age-related osteoporosis without current pathological fracture: Secondary | ICD-10-CM | POA: Diagnosis not present

## 2023-09-16 DIAGNOSIS — Z780 Asymptomatic menopausal state: Secondary | ICD-10-CM

## 2023-09-19 ENCOUNTER — Encounter: Payer: Self-pay | Admitting: Family Medicine
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
## 2023-09-27 ENCOUNTER — Other Ambulatory Visit: Payer: Self-pay | Admitting: Internal Medicine

## 2023-10-18 ENCOUNTER — Ambulatory Visit
Admission: RE | Admit: 2023-10-18 | Discharge: 2023-10-18 | Disposition: A | Discharge status: Home / Self Care | Source: Ambulatory Visit | Attending: Hematology | Admitting: Hematology

## 2023-10-18 DIAGNOSIS — Z9189 Other specified personal risk factors, not elsewhere classified: Secondary | ICD-10-CM

## 2023-10-18 DIAGNOSIS — Z1239 Encounter for other screening for malignant neoplasm of breast: Secondary | ICD-10-CM | POA: Diagnosis not present

## 2023-10-18 DIAGNOSIS — Z853 Personal history of malignant neoplasm of breast: Secondary | ICD-10-CM | POA: Diagnosis not present

## 2023-10-18 MED ORDER — GADOPICLENOL 0.5 MMOL/ML IV SOLN
6.5000 mL | Freq: Once | INTRAVENOUS | Status: AC | PRN
  Administered 2023-10-18: 6.5 mL via INTRAVENOUS

## 2024-01-07 ENCOUNTER — Other Ambulatory Visit: Payer: Self-pay | Admitting: Internal Medicine

## 2024-01-13 ENCOUNTER — Other Ambulatory Visit: Payer: Self-pay | Admitting: Hematology

## 2024-01-13 DIAGNOSIS — Z9889 Other specified postprocedural states: Secondary | ICD-10-CM

## 2024-02-04 ENCOUNTER — Other Ambulatory Visit: Payer: Self-pay | Admitting: Internal Medicine

## 2024-02-06 ENCOUNTER — Other Ambulatory Visit: Payer: Self-pay

## 2024-02-06 ENCOUNTER — Ambulatory Visit: Attending: General Surgery

## 2024-02-06 ENCOUNTER — Telehealth: Payer: Self-pay

## 2024-02-06 VITALS — Wt 133.0 lb

## 2024-02-06 DIAGNOSIS — Z483 Aftercare following surgery for neoplasm: Secondary | ICD-10-CM | POA: Insufficient documentation

## 2024-02-06 MED ORDER — TRIAMTERENE-HCTZ 37.5-25 MG PO TABS: 1.0000 | ORAL_TABLET | Freq: Every day | ORAL | 0 refills | Status: DC

## 2024-02-06 NOTE — Telephone Encounter (Signed)
 Copied from CRM [id number]. Topic: Clinical - Prescription Issue >> Feb 06, 2024  8:38 AM Chasity T wrote: Reason for CRM: Pt is calling to see if Dr Rollene will send a temp supply for triamterene -hydrochlorothiazide (MAXZIDE-25) 37.5-25 MG tablet due to her not having anymore and will be going out of town until this Sunday. Physical is scheduled for patient on Sep 16th.

## 2024-02-06 NOTE — Telephone Encounter (Signed)
 I have sent in a 30 day supply. Looks like a 30 day was sent in last month as well no further refills should be sent in until visit they will have to be approve by provider for further refills

## 2024-02-06 NOTE — Therapy (Signed)
 OUTPATIENT PHYSICAL THERAPY SOZO SCREENING NOTE   Patient Name: VELETA YAMAMOTO MRN: [id number] DOB:1956-02-17, 68 y.o., female Today's Date: 02/06/2024  PCP: Rollene Almarie LABOR, MD REFERRING PROVIDER: Curvin Deward MOULD, MD   PT End of Session - 02/06/24 1507     Visit Number 1   # unchanged due to screen only   PT Start Time 1505    PT Stop Time 1509    PT Time Calculation (min) 4 min    Activity Tolerance Patient tolerated treatment well    Behavior During Therapy [facility] for tasks assessed/performed           Past Medical History:  Diagnosis Date   Hx of adenomatous colonic polyps 05/18/2018   Hypertension    IBS (irritable bowel syndrome)    PONV (postoperative nausea and vomiting)    Varicose veins    Past Surgical History:  Procedure Laterality Date   BREAST BIOPSY Right 2017 and 2006   x 2   BREAST EXCISIONAL BIOPSY     BREAST LUMPECTOMY WITH RADIOACTIVE SEED AND SENTINEL LYMPH NODE BIOPSY Right 04/16/2020   Procedure: BREAST LUMPECTOMY WITH RADIOACTIVE SEED AND SENTINEL LYMPH NODE BIOPSY;  Surgeon: Curvin Deward MOULD, MD;  Location: [facility];  Service: General;  Laterality: Right;   BREAST LUMPECTOMY WITH RADIOACTIVE SEED LOCALIZATION Right 02/13/2016   Procedure: RIGHT BREAST LUMPECTOMY WITH RADIOACTIVE SEED LOCALIZATION;  Surgeon: Deward Curvin III, MD;  Location: [facility];  Service: General;  Laterality: Right;   COLONOSCOPY     NASAL SEPTUM SURGERY  1990   UPPER GASTROINTESTINAL ENDOSCOPY     WISDOM TOOTH EXTRACTION     Patient Active Problem List   Diagnosis Date Noted   Enlarged lymph node in neck 12/20/2022   Trochanteric bursitis of left hip 06/29/2022   Malignant neoplasm of upper-inner quadrant of right breast in female, estrogen receptor positive (HCC) 03/02/2020   Routine general medical examination at a health care facility 01/20/2020   Hx of adenomatous colonic polyps 05/18/2018   Diarrhea 03/24/2016   Varicose vein of  leg 03/15/2012   Essential hypertension     REFERRING DIAG: right breast cancer at risk for lymphedema  THERAPY DIAG: Aftercare following surgery for neoplasm  PERTINENT HISTORY: Rt lumpectomy with SLNB on 04/16/20, currently undergoing radiation   PRECAUTIONS: right UE Lymphedema risk, None  SUBJECTIVE: Pt returns for her last 6 month L-Dex screen.   PAIN:  Are you having pain? No  SOZO SCREENING: Patient was assessed today using the SOZO machine to determine the lymphedema index score. This was compared to her baseline score. It was determined that she is within the recommended range when compared to her baseline and no further action is needed at this time. She will continue SOZO screenings. These are done every 3 months for 2 years post operatively followed by every 6 months for 2 years, and then annually.  Every 3 months until 04/16/22 and then every 6 months until 04/16/24   L-DEX FLOWSHEETS - 02/06/24 1500       L-DEX LYMPHEDEMA SCREENING   Measurement Type Unilateral    L-DEX MEASUREMENT EXTREMITY Upper Extremity    POSITION  Standing    DOMINANT SIDE Right    At Risk Side Right    BASELINE SCORE (UNILATERAL) 0.8    L-DEX SCORE (UNILATERAL) 2.9    VALUE CHANGE (UNILAT) 2.1         P: Pt to transition to annual. l   Aden,  Berwyn Caldron, PTA 02/06/2024, 3:08 PM

## 2024-02-14 ENCOUNTER — Encounter: Payer: Self-pay | Admitting: Internal Medicine

## 2024-02-14 ENCOUNTER — Ambulatory Visit: Payer: Self-pay | Admitting: Internal Medicine

## 2024-02-14 ENCOUNTER — Other Ambulatory Visit (HOSPITAL_COMMUNITY): Payer: Self-pay

## 2024-02-14 ENCOUNTER — Telehealth: Payer: Self-pay

## 2024-02-14 ENCOUNTER — Ambulatory Visit: Admitting: Internal Medicine

## 2024-02-14 VITALS — BP 120/80 | HR 89 | Temp 98.2°F | Ht 67.0 in | Wt 133.0 lb

## 2024-02-14 DIAGNOSIS — I10 Essential (primary) hypertension: Secondary | ICD-10-CM | POA: Diagnosis not present

## 2024-02-14 DIAGNOSIS — Z23 Encounter for immunization: Secondary | ICD-10-CM

## 2024-02-14 DIAGNOSIS — Z0000 Encounter for general adult medical examination without abnormal findings: Secondary | ICD-10-CM

## 2024-02-14 LAB — LIPID PANEL
Cholesterol: 183 mg/dL (ref 0–200)
HDL: 76.2 mg/dL (ref >39.00)
LDL Cholesterol: 91 mg/dL (ref 0–99)
NonHDL: 106.57
Total CHOL/HDL Ratio: 2
Triglycerides: 78 mg/dL (ref 0.0–149.0)
VLDL: 15.6 mg/dL (ref 0.0–40.0)

## 2024-02-14 LAB — VITAMIN D 25 HYDROXY (VIT D DEFICIENCY, FRACTURES): VITD: >120 ng/mL

## 2024-02-14 MED ORDER — TRIAMTERENE-HCTZ 37.5-25 MG PO TABS: 1.0000 | ORAL_TABLET | Freq: Every day | ORAL | 3 refills | Status: AC

## 2024-02-14 MED ORDER — CLONAZEPAM 0.5 MG PO TABS: ORAL_TABLET | ORAL | 1 refills | Status: AC

## 2024-02-14 MED ORDER — DICYCLOMINE HCL 20 MG PO TABS: 20.0000 mg | ORAL_TABLET | ORAL | 3 refills | Status: AC | PRN

## 2024-02-14 NOTE — Progress Notes (Signed)
   Subjective:   Patient ID: Kelsey Simon, female    DOB: 04/13/1956, 67 y.o.   MRN: [id number]  The patient is here for physical. Pertinent topics discussed: Discussed the use of AI scribe software for clinical note transcription with the patient, who gave verbal consent to proceed.  History of Present Illness Kelsey Simon is a 68 year old female who presents for a routine check-up and flu vaccination.   She has chronic back pain for which she sees a chiropractor twice a month. She experiences increased soreness for two to three days post-treatment before it subsides. She is working on improving her posture.  PMH, [facility], social history reviewed and updated  Review of Systems  Constitutional: Negative.   HENT: Negative.    Eyes: Negative.   Respiratory:  Negative for cough, chest tightness and shortness of breath.   Cardiovascular:  Negative for chest pain, palpitations and leg swelling.  Gastrointestinal:  Negative for abdominal distention, abdominal pain, constipation, diarrhea, nausea and vomiting.  Musculoskeletal:  Positive for back pain.  Skin: Negative.   Neurological: Negative.   Psychiatric/Behavioral: Negative.      Objective:  Physical Exam Constitutional:      Appearance: She is well-developed.  HENT:     Head: Normocephalic and atraumatic.  Cardiovascular:     Rate and Rhythm: Normal rate and regular rhythm.  Pulmonary:     Effort: Pulmonary effort is normal. No respiratory distress.     Breath sounds: Normal breath sounds. No wheezing or rales.  Abdominal:     General: Bowel sounds are normal. There is no distension.     Palpations: Abdomen is soft.     Tenderness: There is no abdominal tenderness.  Musculoskeletal:     Cervical back: Normal range of motion.  Skin:    General: Skin is warm and dry.  Neurological:     Mental Status: She is alert and oriented to person, place, and time.     Coordination: Coordination normal.     Vitals:    02/14/24 1029  BP: 120/80  Pulse: 89  Temp: 98.2 F (36.8 C)  TempSrc: Oral  SpO2: 98%  Weight: 133 lb (60.3 kg)  Height: 5' 7 (1.702 m)    Assessment & Plan:  Flu shot given at visit

## 2024-02-14 NOTE — Assessment & Plan Note (Signed)
 Flu shot given. Pneumonia complete. Shingrix complete. Tetanus up to date. Colonoscopy up to date. Mammogram up to date, pap smear aged out and dexa up to date. Counseled about sun safety and mole surveillance. Counseled about the dangers of distracted driving. Given 10 year screening recommendations.

## 2024-02-14 NOTE — Assessment & Plan Note (Signed)
 BP at goal checking labs and adjust as needed hydrochlorothiazide.

## 2024-02-14 NOTE — Telephone Encounter (Signed)
Will address via result note 

## 2024-02-14 NOTE — Telephone Encounter (Signed)
 Erroneous

## 2024-02-14 NOTE — Telephone Encounter (Signed)
 Pharmacy Patient Advocate Encounter   Received notification from CoverMyMeds that prior authorization for Dicyclomine  HCl 20MG  tablets  is required/requested.   Insurance verification completed.   The patient is insured through [employer] .   Per test claim: PA required; PA submitted to above mentioned insurance via Latent Key/confirmation #/EOC [id number] Status is pending

## 2024-02-15 NOTE — Telephone Encounter (Signed)
Please advise as MD is out of office

## 2024-02-23 ENCOUNTER — Other Ambulatory Visit (HOSPITAL_COMMUNITY): Payer: Self-pay

## 2024-02-23 NOTE — Telephone Encounter (Signed)
 Pharmacy Patient Advocate Encounter  Received notification from [facility] MEdD that Prior Authorization for Dicyclomine  HCl 20MG  tablet  has been APPROVED from 02/22/2024 to 02/21/2025. Ran test claim, Copay is $3.96. This test claim was processed through [facility]- copay amounts may vary at other pharmacies due to pharmacy/plan contracts, or as the patient moves through the different stages of their insurance plan.   PA #/Case ID/Reference #: [id number]

## 2024-03-18 DIAGNOSIS — H52223 Regular astigmatism, bilateral: Secondary | ICD-10-CM | POA: Diagnosis not present

## 2024-03-18 DIAGNOSIS — H2513 Age-related nuclear cataract, bilateral: Secondary | ICD-10-CM | POA: Diagnosis not present

## 2024-04-18 ENCOUNTER — Ambulatory Visit
Admission: RE | Admit: 2024-04-18 | Discharge: 2024-04-18 | Disposition: A | Discharge status: Home / Self Care | Source: Ambulatory Visit | Attending: Hematology | Admitting: Hematology

## 2024-04-18 DIAGNOSIS — Z9889 Other specified postprocedural states: Secondary | ICD-10-CM

## 2024-04-18 DIAGNOSIS — R921 Mammographic calcification found on diagnostic imaging of breast: Secondary | ICD-10-CM | POA: Diagnosis not present

## 2024-04-20 ENCOUNTER — Ambulatory Visit: Payer: Self-pay | Admitting: Internal Medicine

## 2024-06-26 ENCOUNTER — Other Ambulatory Visit: Payer: Self-pay

## 2024-06-26 ENCOUNTER — Ambulatory Visit: Admitting: Family Medicine

## 2024-06-26 ENCOUNTER — Ambulatory Visit

## 2024-06-26 VITALS — BP 144/82 | HR 58 | Ht 67.0 in | Wt 135.0 lb

## 2024-06-26 DIAGNOSIS — M25571 Pain in right ankle and joints of right foot: Secondary | ICD-10-CM

## 2024-06-26 DIAGNOSIS — G8929 Other chronic pain: Secondary | ICD-10-CM

## 2024-06-26 NOTE — Patient Instructions (Addendum)
 Thank you for coming in today.   Please get an Xray today before you leave   Please use Voltaren gel (Generic Diclofenac Gel) up to 4x daily for pain as needed.  This is available over-the-counter as both the name brand Voltaren gel and the generic diclofenac gel.   Use your Ankle compression sleeve  Please work on the home exercises the athletic trainer went over with you:  View at [email or web address] code [id number]  If not better, let me know and I can refer you to physical therapy

## 2024-06-26 NOTE — Progress Notes (Signed)
"       ° °  LILLETTE Ileana Collet, PhD, LAT, ATC acting as a scribe for Artist Lloyd, MD.   Kelsey Simon is a 69 y.o. female who presents to [facility] today for R ankle pain. Pt was previously seen by Dr. Lloyd on 09/11/22 for low back and L hip pain.  Today, pt c/o R ankle pain x a few months intermittently. Pain is not constant, but severe and sporadic when it hits. Pt locates pain to the lateral aspect of her R ankle, around the lateral malleolus and proximally. Pain will wake her up at night.  R ankle swelling: no Aggravates: nothing in particular Treatments tried: change shoes, arnica, IBU  Pertinent review of systems: No fevers or chills  Relevant historical information: History of breast cancer.  History of lumbar radiculopathy.   Exam:  BP (!) 144/82   Pulse (!) 58   Ht 5' 7 (1.702 m)   Wt 135 lb (61.2 kg)   SpO2 97%   BMI 21.14 kg/m  General: Well Developed, well nourished, and in no acute distress.   MSK: Right ankle minimal swelling otherwise normal-appearing Normal motion. Nontender to palpation. Stable ligamentous exam. Intact strength without reproducing pain. Pulses capillary fill and sensation are intact distally.    Lab and Radiology Results  Diagnostic Limited MSK Ultrasound of: Right lateral ankle Peroneal tendons are intact.  Trace hypoechoic fluid surrounding peroneal longus tendon.  No visible tendon tear. Lateral ankle joint line trace calcification present in the anterior lateral joint could represent chondrocalcinosis. Impression: Minimal peroneal tenosynovitis and possible chondrocalcinosis.  X-ray images right ankle obtained today personally and independently interpreted. Minimal degenerative changes.  No chondrocalcinosis visible on x-ray Await formal radiology review    Assessment and Plan: 69 y.o. female with lateral ankle pain.  Etiology is challenging at this time.  She does have a little bit of tenosynovitis on  ultrasound and a little bit of calcification at the ankle joint that most likely represents an old avulsion.  Plan for home exercise program and compression sleeve and Voltaren gel.  If not better consider formal physical therapy.  If that does not work consider MRI.  PDMP not reviewed this encounter. Orders Placed This Encounter  Procedures   DG Ankle Complete Right    Standing Status:   Future    Expiration Date:   06/26/2025    Reason for Exam (SYMPTOM  OR DIAGNOSIS REQUIRED):   right ankle pain    Preferred imaging location?:   [facility] [place]   US  LIMITED JOINT SPACE STRUCTURES LOW RIGHT(NO LINKED CHARGES)    Reason for Exam (SYMPTOM  OR DIAGNOSIS REQUIRED):   right ankle pain    Preferred imaging location?:   [facility]   No orders of the defined types were placed in this encounter.    Discussed warning signs or symptoms. Please see discharge instructions. Patient expresses understanding.   The above documentation has been reviewed and is accurate and complete Artist Lloyd, M.D.   "

## 2024-06-27 ENCOUNTER — Ambulatory Visit: Payer: Self-pay | Admitting: Family Medicine

## 2024-06-27 NOTE — Progress Notes (Signed)
Right ankle x-ray shows a small heel spur.

## 2024-08-27 ENCOUNTER — Ambulatory Visit: Admitting: Nurse Practitioner

## 2024-08-27 ENCOUNTER — Other Ambulatory Visit

## 2025-02-11 ENCOUNTER — Ambulatory Visit
